# Patient Record
Sex: Male | Born: 1955 | Race: White | Hispanic: No | Marital: Married | Smoking: Never smoker
Health system: Southern US, Community
[De-identification: ages and names within clinical notes are randomized; demographics above are authoritative.]

## PROBLEM LIST (undated history)

## (undated) DIAGNOSIS — G4733 Obstructive sleep apnea (adult) (pediatric): Secondary | ICD-10-CM

## (undated) DIAGNOSIS — H269 Unspecified cataract: Secondary | ICD-10-CM

## (undated) DIAGNOSIS — D126 Benign neoplasm of colon, unspecified: Secondary | ICD-10-CM

## (undated) DIAGNOSIS — I10 Essential (primary) hypertension: Secondary | ICD-10-CM

## (undated) DIAGNOSIS — Q231 Congenital insufficiency of aortic valve: Secondary | ICD-10-CM

## (undated) DIAGNOSIS — I712 Thoracic aortic aneurysm, without rupture: Secondary | ICD-10-CM

## (undated) DIAGNOSIS — Z9989 Dependence on other enabling machines and devices: Secondary | ICD-10-CM

## (undated) DIAGNOSIS — E785 Hyperlipidemia, unspecified: Secondary | ICD-10-CM

## (undated) DIAGNOSIS — I639 Cerebral infarction, unspecified: Secondary | ICD-10-CM

## (undated) DIAGNOSIS — E119 Type 2 diabetes mellitus without complications: Principal | ICD-10-CM

## (undated) DIAGNOSIS — Q2381 Bicuspid aortic valve: Secondary | ICD-10-CM

## (undated) DIAGNOSIS — Z5189 Encounter for other specified aftercare: Secondary | ICD-10-CM

## (undated) DIAGNOSIS — I7121 Aneurysm of the ascending aorta, without rupture: Secondary | ICD-10-CM

## (undated) HISTORY — DX: Obstructive sleep apnea (adult) (pediatric): G47.33

## (undated) HISTORY — PX: CATARACT EXTRACTION: SUR2

## (undated) HISTORY — DX: Encounter for other specified aftercare: Z51.89

## (undated) HISTORY — DX: Thoracic aortic aneurysm, without rupture: I71.2

## (undated) HISTORY — DX: Hyperlipidemia, unspecified: E78.5

## (undated) HISTORY — DX: Unspecified cataract: H26.9

## (undated) HISTORY — DX: Cerebral infarction, unspecified: I63.9

## (undated) HISTORY — DX: Congenital insufficiency of aortic valve: Q23.1

## (undated) HISTORY — PX: EYE SURGERY: SHX253

## (undated) HISTORY — DX: Benign neoplasm of colon, unspecified: D12.6

## (undated) HISTORY — DX: Bicuspid aortic valve: Q23.81

## (undated) HISTORY — DX: Type 2 diabetes mellitus without complications: E11.9

## (undated) HISTORY — DX: Obstructive sleep apnea (adult) (pediatric): Z99.89

## (undated) HISTORY — DX: Essential (primary) hypertension: I10

## (undated) HISTORY — PX: OTHER SURGICAL HISTORY: SHX169

## (undated) HISTORY — DX: Aneurysm of the ascending aorta, without rupture: I71.21

---

## 2003-03-08 ENCOUNTER — Encounter: Payer: Self-pay | Admitting: Internal Medicine

## 2007-08-09 ENCOUNTER — Ambulatory Visit: Payer: Self-pay | Admitting: Internal Medicine

## 2007-08-09 DIAGNOSIS — I10 Essential (primary) hypertension: Secondary | ICD-10-CM | POA: Insufficient documentation

## 2007-08-09 DIAGNOSIS — E1169 Type 2 diabetes mellitus with other specified complication: Secondary | ICD-10-CM | POA: Insufficient documentation

## 2007-08-09 DIAGNOSIS — E785 Hyperlipidemia, unspecified: Secondary | ICD-10-CM | POA: Insufficient documentation

## 2007-08-11 LAB — CONVERTED CEMR LAB
ALT: 22 U/L
AST: 23 U/L
Albumin: 3.5 g/dL
Alkaline Phosphatase: 73 U/L
BUN: 9 mg/dL
Basophils Absolute: 0 10*3/uL
Basophils Relative: 0.3 %
Bilirubin, Direct: 0.1 mg/dL
CO2: 29 meq/L
Calcium: 9.2 mg/dL
Chloride: 105 meq/L
Cholesterol: 223 mg/dL
Creatinine, Ser: 1.3 mg/dL
Direct LDL: 159 mg/dL
Eosinophils Absolute: 0.1 10*3/uL
Eosinophils Relative: 1 %
GFR calc Af Amer: 75 mL/min
GFR calc non Af Amer: 62 mL/min
Glucose, Bld: 100 mg/dL — ABNORMAL HIGH
HCT: 46 %
HDL: 30.7 mg/dL — ABNORMAL LOW
Hemoglobin: 15.6 g/dL
Lymphocytes Relative: 43.9 %
MCHC: 34 g/dL
MCV: 92.4 fL
Monocytes Absolute: 0.6 10*3/uL
Monocytes Relative: 6.5 %
Neutro Abs: 4.3 10*3/uL
Neutrophils Relative %: 48.3 %
PSA: 1.24 ng/mL
Phosphorus: 3.6 mg/dL
Platelets: 263 10*3/uL
Potassium: 4.8 meq/L
RBC: 4.98 M/uL
RDW: 13 %
Sodium: 140 meq/L
TSH: 0.99 u[IU]/mL
Total Bilirubin: 0.8 mg/dL
Total CHOL/HDL Ratio: 7.3
Total Protein: 7.1 g/dL
Triglycerides: 156 mg/dL — ABNORMAL HIGH
VLDL: 31 mg/dL
WBC: 9 10*3/uL

## 2007-09-15 ENCOUNTER — Ambulatory Visit: Payer: Self-pay | Admitting: Gastroenterology

## 2007-09-29 ENCOUNTER — Encounter: Payer: Self-pay | Admitting: Gastroenterology

## 2007-09-29 ENCOUNTER — Encounter: Payer: Self-pay | Admitting: Internal Medicine

## 2007-09-29 ENCOUNTER — Ambulatory Visit: Payer: Self-pay | Admitting: Gastroenterology

## 2007-09-29 DIAGNOSIS — D126 Benign neoplasm of colon, unspecified: Secondary | ICD-10-CM

## 2007-09-29 HISTORY — DX: Benign neoplasm of colon, unspecified: D12.6

## 2007-09-29 HISTORY — PX: COLONOSCOPY W/ BIOPSIES AND POLYPECTOMY: SHX1376

## 2007-09-29 LAB — HM COLONOSCOPY

## 2007-09-30 ENCOUNTER — Observation Stay (HOSPITAL_COMMUNITY): Admission: EM | Admit: 2007-09-30 | Discharge: 2007-10-01 | Payer: Self-pay | Admitting: Emergency Medicine

## 2007-10-03 ENCOUNTER — Ambulatory Visit: Payer: Self-pay | Admitting: Internal Medicine

## 2007-11-01 ENCOUNTER — Ambulatory Visit: Payer: Self-pay | Admitting: Gastroenterology

## 2008-12-17 ENCOUNTER — Ambulatory Visit: Payer: Self-pay | Admitting: Internal Medicine

## 2008-12-17 DIAGNOSIS — R002 Palpitations: Secondary | ICD-10-CM | POA: Insufficient documentation

## 2008-12-19 LAB — CONVERTED CEMR LAB
AST: 26 units/L (ref 0–37)
Albumin: 3.6 g/dL (ref 3.5–5.2)
BUN: 11 mg/dL (ref 6–23)
Basophils Absolute: 0 10*3/uL (ref 0.0–0.1)
Calcium: 8.9 mg/dL (ref 8.4–10.5)
Eosinophils Absolute: 0.1 10*3/uL (ref 0.0–0.7)
Glucose, Bld: 95 mg/dL (ref 70–99)
HCT: 45 % (ref 39.0–52.0)
Lymphocytes Relative: 34.5 % (ref 12.0–46.0)
Lymphs Abs: 2.7 10*3/uL (ref 0.7–4.0)
MCHC: 34 g/dL (ref 30.0–36.0)
Monocytes Relative: 6.6 % (ref 3.0–12.0)
Phosphorus: 3.2 mg/dL (ref 2.3–4.6)
Platelets: 213 10*3/uL (ref 150.0–400.0)
RDW: 12.6 % (ref 11.5–14.6)
TSH: 0.7 microintl units/mL (ref 0.35–5.50)
Total Bilirubin: 1 mg/dL (ref 0.3–1.2)

## 2008-12-30 ENCOUNTER — Ambulatory Visit: Payer: Self-pay

## 2008-12-30 ENCOUNTER — Encounter: Payer: Self-pay | Admitting: Internal Medicine

## 2009-01-20 ENCOUNTER — Ambulatory Visit: Payer: Self-pay | Admitting: Internal Medicine

## 2009-01-20 DIAGNOSIS — I712 Thoracic aortic aneurysm, without rupture, unspecified: Secondary | ICD-10-CM | POA: Insufficient documentation

## 2009-01-20 DIAGNOSIS — R9431 Abnormal electrocardiogram [ECG] [EKG]: Secondary | ICD-10-CM | POA: Insufficient documentation

## 2009-01-20 DIAGNOSIS — G4730 Sleep apnea, unspecified: Secondary | ICD-10-CM | POA: Insufficient documentation

## 2009-01-21 ENCOUNTER — Encounter (INDEPENDENT_AMBULATORY_CARE_PROVIDER_SITE_OTHER): Payer: Self-pay | Admitting: *Deleted

## 2009-01-28 ENCOUNTER — Ambulatory Visit (HOSPITAL_COMMUNITY): Admission: RE | Admit: 2009-01-28 | Discharge: 2009-01-28 | Payer: Self-pay | Admitting: Internal Medicine

## 2009-01-28 ENCOUNTER — Ambulatory Visit: Payer: Self-pay | Admitting: Cardiovascular Disease

## 2009-02-04 ENCOUNTER — Telehealth: Payer: Self-pay | Admitting: Internal Medicine

## 2009-02-05 ENCOUNTER — Telehealth: Payer: Self-pay | Admitting: Internal Medicine

## 2009-02-13 ENCOUNTER — Ambulatory Visit: Payer: Self-pay | Admitting: Internal Medicine

## 2009-02-23 ENCOUNTER — Encounter: Payer: Self-pay | Admitting: Internal Medicine

## 2009-02-24 ENCOUNTER — Ambulatory Visit: Payer: Self-pay | Admitting: Cardiology

## 2009-02-25 ENCOUNTER — Telehealth: Payer: Self-pay | Admitting: Internal Medicine

## 2009-02-25 LAB — CONVERTED CEMR LAB
CO2: 26 meq/L (ref 19–32)
Calcium: 9.2 mg/dL (ref 8.4–10.5)
Chloride: 108 meq/L (ref 96–112)
Glucose, Bld: 110 mg/dL — ABNORMAL HIGH (ref 70–99)
Sodium: 142 meq/L (ref 135–145)

## 2009-02-27 ENCOUNTER — Ambulatory Visit: Payer: Self-pay

## 2009-03-07 ENCOUNTER — Telehealth: Payer: Self-pay | Admitting: Internal Medicine

## 2009-03-13 ENCOUNTER — Telehealth: Payer: Self-pay | Admitting: Cardiology

## 2009-04-03 ENCOUNTER — Ambulatory Visit: Payer: Self-pay | Admitting: Cardiology

## 2009-04-04 LAB — CONVERTED CEMR LAB
BUN: 13 mg/dL (ref 6–23)
CO2: 27 meq/L (ref 19–32)
Chloride: 106 meq/L (ref 96–112)
Creatinine, Ser: 1.3 mg/dL (ref 0.4–1.5)
Glucose, Bld: 100 mg/dL — ABNORMAL HIGH (ref 70–99)
Potassium: 4.6 meq/L (ref 3.5–5.1)

## 2009-05-14 ENCOUNTER — Ambulatory Visit: Payer: Self-pay | Admitting: Cardiology

## 2009-05-15 ENCOUNTER — Ambulatory Visit (HOSPITAL_COMMUNITY): Admission: RE | Admit: 2009-05-15 | Discharge: 2009-05-15 | Payer: Self-pay | Admitting: Cardiology

## 2009-05-18 ENCOUNTER — Encounter: Payer: Self-pay | Admitting: Cardiology

## 2009-05-19 LAB — CONVERTED CEMR LAB
BUN: 12 mg/dL (ref 6–23)
CO2: 27 meq/L (ref 19–32)
Calcium: 8.9 mg/dL (ref 8.4–10.5)
Creatinine, Ser: 1.2 mg/dL (ref 0.4–1.5)
GFR calc non Af Amer: 67.23 mL/min (ref >60)
Glucose, Bld: 116 mg/dL — ABNORMAL HIGH (ref 70–99)
Sodium: 140 meq/L (ref 135–145)

## 2009-05-20 ENCOUNTER — Ambulatory Visit: Payer: Self-pay | Admitting: Cardiology

## 2009-05-20 DIAGNOSIS — I359 Nonrheumatic aortic valve disorder, unspecified: Secondary | ICD-10-CM | POA: Insufficient documentation

## 2009-05-23 ENCOUNTER — Ambulatory Visit: Payer: Self-pay | Admitting: Cardiology

## 2009-05-26 LAB — CONVERTED CEMR LAB
HDL: 23.8 mg/dL — ABNORMAL LOW (ref >39.00)
Total CHOL/HDL Ratio: 7

## 2009-06-03 ENCOUNTER — Telehealth: Payer: Self-pay | Admitting: Cardiology

## 2009-08-04 ENCOUNTER — Encounter: Payer: Self-pay | Admitting: Cardiology

## 2009-08-05 ENCOUNTER — Encounter: Payer: Self-pay | Admitting: Cardiology

## 2009-08-06 ENCOUNTER — Ambulatory Visit: Payer: Self-pay | Admitting: Cardiology

## 2009-08-12 ENCOUNTER — Telehealth (INDEPENDENT_AMBULATORY_CARE_PROVIDER_SITE_OTHER): Payer: Self-pay | Admitting: *Deleted

## 2009-08-13 ENCOUNTER — Telehealth: Payer: Self-pay | Admitting: Cardiology

## 2009-08-13 ENCOUNTER — Ambulatory Visit: Payer: Self-pay | Admitting: Cardiology

## 2009-08-15 LAB — CONVERTED CEMR LAB
ALT: 27 units/L (ref 0–53)
Albumin: 3.4 g/dL — ABNORMAL LOW (ref 3.5–5.2)
Bilirubin, Direct: 0.1 mg/dL (ref 0.0–0.3)
Cholesterol: 128 mg/dL (ref 0–200)
LDL Cholesterol: 73 mg/dL (ref 0–99)
Total Protein: 7 g/dL (ref 6.0–8.3)
Triglycerides: 118 mg/dL (ref 0.0–149.0)
VLDL: 23.6 mg/dL (ref 0.0–40.0)

## 2009-08-26 ENCOUNTER — Ambulatory Visit: Payer: Self-pay | Admitting: Internal Medicine

## 2009-08-26 DIAGNOSIS — J019 Acute sinusitis, unspecified: Secondary | ICD-10-CM | POA: Insufficient documentation

## 2009-09-08 ENCOUNTER — Telehealth (INDEPENDENT_AMBULATORY_CARE_PROVIDER_SITE_OTHER): Payer: Self-pay | Admitting: *Deleted

## 2009-09-12 ENCOUNTER — Encounter: Payer: Self-pay | Admitting: Cardiology

## 2009-09-12 ENCOUNTER — Ambulatory Visit (HOSPITAL_BASED_OUTPATIENT_CLINIC_OR_DEPARTMENT_OTHER): Admission: RE | Admit: 2009-09-12 | Discharge: 2009-09-12 | Payer: Self-pay | Admitting: Cardiology

## 2009-09-23 ENCOUNTER — Ambulatory Visit: Payer: Self-pay | Admitting: Pulmonary Disease

## 2009-09-24 ENCOUNTER — Telehealth (INDEPENDENT_AMBULATORY_CARE_PROVIDER_SITE_OTHER): Payer: Self-pay | Admitting: *Deleted

## 2009-10-03 ENCOUNTER — Ambulatory Visit: Payer: Self-pay | Admitting: Pulmonary Disease

## 2009-10-03 DIAGNOSIS — G4733 Obstructive sleep apnea (adult) (pediatric): Secondary | ICD-10-CM | POA: Insufficient documentation

## 2009-10-10 ENCOUNTER — Telehealth: Payer: Self-pay | Admitting: Cardiology

## 2009-10-23 ENCOUNTER — Ambulatory Visit: Payer: Self-pay | Admitting: Cardiology

## 2009-10-23 LAB — CONVERTED CEMR LAB
BUN: 15 mg/dL (ref 6–23)
Chloride: 109 meq/L (ref 96–112)
Creatinine, Ser: 1.3 mg/dL (ref 0.4–1.5)
GFR calc non Af Amer: 61.2 mL/min (ref >60)
Potassium: 5.5 meq/L — ABNORMAL HIGH (ref 3.5–5.1)

## 2009-10-30 ENCOUNTER — Ambulatory Visit: Payer: Self-pay | Admitting: Cardiology

## 2009-10-30 DIAGNOSIS — E875 Hyperkalemia: Secondary | ICD-10-CM | POA: Insufficient documentation

## 2009-11-05 ENCOUNTER — Ambulatory Visit (HOSPITAL_COMMUNITY): Admission: RE | Admit: 2009-11-05 | Discharge: 2009-11-05 | Payer: Self-pay | Admitting: Cardiology

## 2009-11-05 ENCOUNTER — Ambulatory Visit: Payer: Self-pay | Admitting: Cardiology

## 2009-11-05 LAB — CONVERTED CEMR LAB
CO2: 27 meq/L (ref 19–32)
Calcium: 9 mg/dL (ref 8.4–10.5)
GFR calc non Af Amer: 61.19 mL/min (ref >60)
Potassium: 4.3 meq/L (ref 3.5–5.1)
Sodium: 139 meq/L (ref 135–145)

## 2009-11-07 ENCOUNTER — Ambulatory Visit: Payer: Self-pay | Admitting: Pulmonary Disease

## 2009-12-08 ENCOUNTER — Encounter (INDEPENDENT_AMBULATORY_CARE_PROVIDER_SITE_OTHER): Payer: Self-pay | Admitting: *Deleted

## 2009-12-11 ENCOUNTER — Encounter (INDEPENDENT_AMBULATORY_CARE_PROVIDER_SITE_OTHER): Payer: Self-pay | Admitting: *Deleted

## 2010-01-07 ENCOUNTER — Encounter: Payer: Self-pay | Admitting: Cardiology

## 2010-01-07 ENCOUNTER — Ambulatory Visit (HOSPITAL_COMMUNITY): Admission: RE | Admit: 2010-01-07 | Discharge: 2010-01-07 | Payer: Self-pay | Admitting: Cardiology

## 2010-01-07 ENCOUNTER — Ambulatory Visit: Payer: Self-pay

## 2010-01-07 ENCOUNTER — Ambulatory Visit: Payer: Self-pay | Admitting: Cardiology

## 2010-01-21 ENCOUNTER — Encounter: Payer: Self-pay | Admitting: Cardiology

## 2010-01-22 ENCOUNTER — Ambulatory Visit: Payer: Self-pay | Admitting: Cardiology

## 2010-02-11 ENCOUNTER — Encounter: Payer: Self-pay | Admitting: Pulmonary Disease

## 2010-05-08 ENCOUNTER — Ambulatory Visit: Payer: Self-pay | Admitting: Pulmonary Disease

## 2010-05-08 ENCOUNTER — Ambulatory Visit: Payer: Self-pay | Admitting: Internal Medicine

## 2010-05-11 LAB — CONVERTED CEMR LAB
Alkaline Phosphatase: 59 units/L (ref 39–117)
Basophils Relative: 0.6 % (ref 0.0–3.0)
Chloride: 109 meq/L (ref 96–112)
Cholesterol: 129 mg/dL (ref 0–200)
Eosinophils Relative: 1.7 % (ref 0.0–5.0)
Glucose, Bld: 106 mg/dL — ABNORMAL HIGH (ref 70–99)
HDL: 27.5 mg/dL — ABNORMAL LOW (ref >39.00)
Hemoglobin: 14.4 g/dL (ref 13.0–17.0)
LDL Cholesterol: 82 mg/dL (ref 0–99)
Lymphocytes Relative: 44.3 % (ref 12.0–46.0)
Monocytes Relative: 7.6 % (ref 3.0–12.0)
Neutrophils Relative %: 45.8 % (ref 43.0–77.0)
Phosphorus: 2.9 mg/dL (ref 2.3–4.6)
Potassium: 4.6 meq/L (ref 3.5–5.1)
RBC: 4.68 M/uL (ref 4.22–5.81)
Sodium: 142 meq/L (ref 135–145)
Total Protein: 6.7 g/dL (ref 6.0–8.3)
Triglycerides: 98 mg/dL (ref 0.0–149.0)
VLDL: 19.6 mg/dL (ref 0.0–40.0)
WBC: 7.2 10*3/uL (ref 4.5–10.5)

## 2010-06-28 ENCOUNTER — Encounter: Payer: Self-pay | Admitting: Pulmonary Disease

## 2010-07-27 ENCOUNTER — Encounter: Payer: Self-pay | Admitting: Cardiology

## 2010-07-29 ENCOUNTER — Ambulatory Visit: Payer: Self-pay | Admitting: Cardiology

## 2010-09-18 ENCOUNTER — Other Ambulatory Visit: Payer: Self-pay | Admitting: Cardiology

## 2010-09-18 DIAGNOSIS — I729 Aneurysm of unspecified site: Secondary | ICD-10-CM

## 2010-09-20 ENCOUNTER — Encounter: Payer: Self-pay | Admitting: Cardiology

## 2010-09-29 NOTE — Cardiovascular Report (Signed)
Summary: Outpatient Coinsurance Notice   Outpatient Coinsurance Notice   Imported By: Roderic Ovens 01/12/2010 12:42:26  _____________________________________________________________________  External Attachment:    Type:   Image     Comment:   External Document

## 2010-09-29 NOTE — Miscellaneous (Signed)
Summary: optimal pressure 13cm  Clinical Lists Changes  Orders: Added new Referral order of DME Referral (DME) - Signed auto shows good compliance, optimal pressure 13cm

## 2010-09-29 NOTE — Letter (Signed)
Summary: Appointment - Reschedule  [facility], Main Office  [address]   [place], [place] [address]   Phone: [phone]  Fax: [phone]     December 08, 2009 MRN: [id number]   [facility] [address] [place], [place]  [address]   Dear Mr. Ivan,   Due to a change in our office schedule, your appointment on 01/20/2010 at  4:00 must be changed.  It is very important that we reach you to reschedule this appointment. We look forward to participating in your health care needs. Please contact us at the number listed above at your earliest convenience to reschedule this appointment.     Sincerely,  [facility] Scheduling Team

## 2010-09-29 NOTE — Assessment & Plan Note (Signed)
Summary: rov f/u few days after echo 01-07-10 @ 8:30   Referring Provider:  Shirlee Latch Primary Provider:  Cindee Salt MD  CC:  follow up to echo. Pt reports no cardiac concerns at this time.  Pt states very tired. Pt then states that when BP gets low his heart feels like it is beating fast..  History of Present Illness: 55 yo with bicuspid aortic valve disorder and dilated ascending aorta returns for evaluation. Aortic valve and aortic aneurysm were stable on last echo and MRA.  BP is under good control (109/67 today).  Patient has lost 17 lbs since last appointment with diet and exercise.  He has started CPAP for OSA.  Initially, he felt a lot better but has now gone back to significant fatigue during the day.  He thinks it may be due to his work schedule which has been very stressful with long hours (several projects).  No chest pain.  No exertional dyspnea.  He has had episodes where he feels his heart racing.  This usually occurs at rest.  He takes his heart rate and BP when this happens, and with these episodes, SBP is in the 90s and HR ranges from the 130s-150s.  Episodes are rare and tend to last 15-30 minutes.    Labs (9/10): LDL 134, HDL 24 Labs (12/10): LDL 73, HDL 32 Labs (3/11): K 4.3, creatinine 1.3  Current Medications (verified): 1)  Adult Aspirin Low Strength 81 Mg  Tbdp (Aspirin) .... Take 1 Tablet By Mouth Once A Day 2)  Fish Oil Concentrate 1000 Mg Caps (Omega-3 Fatty Acids) .... Take 2 By Mouth Once Daily 3)  Lisinopril 20 Mg Tabs (Lisinopril) .[name].. 1 Daily 4)  Toprol Xl 50 Mg Xr24h-Tab (Metoprolol Succinate) .[name].. 1 Daily 5)  Crestor 5 Mg Tabs (Rosuvastatin Calcium) .[name].. 1 Daily 6)  Lisinopril 10 Mg Tabs (Lisinopril) .... One Tablet Daily At Bedtime 7)  Viagra 50 Mg Tabs (Sildenafil Citrate) .... One Dose As Needed 1 Hour Prior To Sexual Activity 8)  Cpap  Allergies (verified): No Known Drug Allergies  Past History:  Past Medical History: 1. Hyperlipidemia 2.  Hypertension 3. Colon Polyps--adenomatous  4. Post Polypectomy Bleed 5. Bicuspid aortic valve: Echo (5/10) with bicuspid AoV (upon re-review), mild LV dilation, EF 50-55%, no aortic stenosis, mild AI, moderate ascending aortic dilation (4.8 cm).  Bicuspid valve confirmed by MRI.  Echo (3/11) bicuspid aortic valve with no AS but mild AI, mild LVH, EF 60%.  6. Ascending aortic aneurysm: 4.8 cm on echo (5/10), 4.6 x 4.6 cm by MRA 6/10, 4.6 cm aortic root and ascending aorta by MRA 9/10.  MRA chest (3/11) 4.5 cm ascending aorta.  7. OSA on CPAP  Family History: Reviewed history from 08/09/2007 and no changes required. Dad died @52  some type of cancer Mom has HTN,  DM 1 brother 2 sisters CAD in mat GF HTN on Mom's side DM just in Mom No prostate or colon cancer  Social History: Reviewed history from 08/09/2007 and no changes required. 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   Vital Signs:  Patient profile:   55 year old male Height:      72 inches Weight:      249 pounds BMI:     33.89 Pulse rate:   70 / minute Pulse rhythm:   regular BP sitting:  109 / 67  (left arm) Cuff size:   large  Vitals Entered By: Judithe Modest CMA (Jan 22, 2010 4:12 PM)  Physical Exam  General:  Well developed, well nourished, in no acute distress. Neck:  Neck supple, no JVD. No masses, thyromegaly or abnormal cervical nodes. Lungs:  Clear bilaterally to auscultation and percussion. Heart:  Non-displaced PMI, chest non-tender; regular rate and rhythm, S1, S2 without rubs or gallops. 2/6 early systolic crescendo-decrescendo murmur with clear S2.  Carotid upstroke normal, no bruit. Pedals normal pulses. No edema, no varicosities. Abdomen:  Bowel sounds positive; abdomen soft and non-tender without masses, organomegaly, or hernias noted. No  hepatosplenomegaly. Extremities:  No clubbing or cyanosis. Neurologic:  Alert and oriented x 3. Psych:  Normal affect.   Impression & Recommendations:  Problem # 1:  AORTIC VALVE DISORDERS (ICD-424.1) Bicuspid aortic valve with no significant AS and mild AI.  Repeat echo in 1 year after last echo (5/12).    Problem # 2:  ESSENTIAL HYPERTENSION, BENIGN (ICD-401.1) BP is well-controlled, continue current medications.   Problem # 3:  ASCENDING AORTIC ANEURYSM (ICD-441.2) Patient has bicuspid aortic valve disorder with a dilated aortic root and ascending aorta.  The arch is not significantly dilated.  He is no AS and mild AI.  Last MRA showed ascending aorta measuring 4.5 cm (stable).  BP control will be important.  He will followup for MRA thoracic aorta in 3/12.  When it reaches 5 cm in size, he will need repair.  Problem # 4:  PALPITATIONS (ICD-785.1) Patient has documented episodes of tachycardia and relative hypotension on his home BP monitor.  They tend to occur periodically, not associated with exertion.  HRs range 130s-150s.  ? SVT versus atrial fibrillation.  I will have him wear an event monitor for 3 wks to assess for arrhythmias.    Other Orders: MRA (MRA) Echocardiogram (Echo) Event (Event)  Patient Instructions: 1)  Your physician recommends that you schedule a follow-up appointment in: 6 months 2)  Your physician has requested that you have an echocardiogram.  Echocardiography is a painless test that uses sound waves to create images of your heart. It provides your doctor with information about the size and shape of your heart and how well your heart's chambers and valves are working.  This procedure takes approximately one hour. There are no restrictions for this procedure. To be done in May 2012 3)  Your physician has recommended that you wear an event monitor.  Event monitors are medical devices that record the heart's electrical activity. Doctors most often use these  monitors to diagnose arrhythmias. Arrhythmias are problems with the speed or rhythm of the heartbeat. The monitor is a small, portable device. You can wear one while you do your normal daily activities. This is usually used to diagnose what is causing palpitations/syncope (passing out). 4)  Your physician has requested that you have a cardiac MRI.  Cardiac MRI uses a computer to create images of your heart as it's beating, producing both still and moving pictures of your heart and major blood vessels. For further information please visit  [email or web address]  To be done in march 2012 Prescriptions: VIAGRA 50 MG TABS (SILDENAFIL CITRATE) one dose as needed 1 hour prior to sexual activity  #20 x 2   Entered by:   Dossie Arbour, RN, BSN   Authorized by:   Marca Ancona, MD   Signed by:   Dossie Arbour, RN, BSN on 01/22/2010   Method  used:   Electronically to        [facility] (retail)       [address]       [place], [place]  [address]       Ph: [phone]       Fax: [phone]   RxID:   [id number]

## 2010-09-29 NOTE — Assessment & Plan Note (Signed)
Summary: 6 month rov.sl   Referring Chrystine Frogge:  Shirlee Latch Primary Ailine Hefferan:  Cindee Salt MD  CC:  check up.  History of Present Illness: 55 yo with bicuspid aortic valve disorder and dilated ascending aorta returns for evaluation. Aortic valve and aortic aneurysm were stable on last echo and MRA.  BP is under good control (122/74 today).  His home readings have for the most past been less than 130 systolic.  After last appointment, he never did the event monitor and says that the tachypalpitations he was having have completely resolved.  He has not been exercising as much as he had been and his weight is back up 20 lbs since last appointment (had lost 17 lbs before).  No chest pain, no exertional dyspnea.  He is using CPAP.   Labs (9/10): LDL 134, HDL 24 Labs (12/10): LDL 73, HDL 32 Labs (3/11): K 4.3, creatinine 1.3  ECG: NSR, LAFB, Qs V1 and V2  Current Medications (verified): 1)  Lisinopril 10 Mg Tabs (Lisinopril) .... One Tablet Daily At Bedtime 2)  Lisinopril 20 Mg Tabs (Lisinopril) .[name].. 1 Daily 3)  Toprol Xl 50 Mg Xr24h-Tab (Metoprolol Succinate) .[name].. 1 Daily 4)  Crestor 5 Mg Tabs (Rosuvastatin Calcium) .[name].. 1 Daily 5)  Viagra 50 Mg Tabs (Sildenafil Citrate) .... One Dose As Needed 1 Hour Prior To Sexual Activity 6)  Adult Aspirin Low Strength 81 Mg  Tbdp (Aspirin) .... Take 1 Tablet By Mouth Once A Day 7)  Flax Seed Oil 1000 Mg Caps (Flaxseed (Linseed)) .... One Tablet Once Daily 8)  Cpap  Allergies: No Known Drug Allergies  Past History:  Past Medical History: Reviewed history from 01/22/2010 and no changes required. 1. Hyperlipidemia 2. Hypertension 3. Colon Polyps--adenomatous  4. Post Polypectomy Bleed 5. Bicuspid aortic valve: Echo (5/10) with bicuspid AoV (upon re-review), mild LV dilation, EF 50-55%, no aortic stenosis, mild AI, moderate ascending aortic dilation (4.8 cm).  Bicuspid valve confirmed by MRI.  Echo (3/11) bicuspid aortic valve with no AS but mild  AI, mild LVH, EF 60%.  6. Ascending aortic aneurysm: 4.8 cm on echo (5/10), 4.6 x 4.6 cm by MRA 6/10, 4.6 cm aortic root and ascending aorta by MRA 9/10.  MRA chest (3/11) 4.5 cm ascending aorta.  7. OSA on CPAP  Family History: Reviewed history from 08/09/2007 and no changes required. Dad died @52  some type of cancer Mom has HTN,  DM 1 brother 2 sisters CAD in mat GF HTN on Mom's side DM just in Mom No prostate or colon cancer  Social History: Reviewed history from 08/09/2007 and no changes required. 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  Obese.  Neck:  Neck supple, no JVD. No masses, thyromegaly or abnormal cervical nodes. Lungs:  Clear bilaterally to auscultation and percussion. Heart:  Non-displaced PMI, chest non-tender; regular rate and rhythm, S1, S2 without rubs or gallops. 1/6 early systolic ejection murmur RUSB.  Carotid upstroke normal, no bruit. Pedals normal pulses. No edema, no varicosities. Abdomen:  Bowel sounds positive; abdomen soft and non-tender without masses, organomegaly, or hernias noted. No hepatosplenomegaly. Extremities:  No clubbing or cyanosis. Neurologic:  Alert and oriented x 3. Psych:  Normal affect.   Impression & Recommendations:  Problem # 1:  AORTIC VALVE DISORDERS (ICD-424.1) Bicuspid aortic valve with no significant AS and mild AI.  Can repeat echo 2 years after last echo if remains stable (5/13).   Problem # 2:  ESSENTIAL HYPERTENSION, BENIGN (ICD-401.1) BP is well-controlled, continue current medications.  Need good BP control for  management of thoracic aortic aneurysm.   Problem # 3:  ASCENDING AORTIC ANEURYSM (ICD-441.2) Patient has bicuspid aortic valve disorder with a dilated aortic root and ascending aorta.  The arch is not significantly dilated.   Last MRA showed ascending aorta measuring 4.5 cm (stable).  BP control will be important.  He will followup for MRA thoracic aorta in 3/12.  When it reaches 5 cm in size, he will need repair.  Problem # 4:  OBESITY Patient has gained 20 lbs since last appointment. We talked about diet and exercise.   Patient Instructions: 1)  MRA of chest to check your  aorta in March 2012. 2)  Echo in May 2012. 3)  Your physician wants you to follow-up in: 6 months with Dr Shirlee Latch..  You will receive a reminder letter in the mail two months in advance. If you don't receive a letter, please call our office to schedule the follow-up appointment.

## 2010-09-29 NOTE — Assessment & Plan Note (Signed)
Summary: rov for osa   Visit Type:  Follow-up Copy to:  Shirlee Latch Primary Provider/Referring Provider:  Cindee Salt MD  CC:  6 month follow up. pt states uses cpapc 7/7 nights and x6-7 hrs a night. Pt states he has no compaints today.  History of Present Illness: the pt comes in today for f/u of his known osa.  He has been wearing cpap compliantly, and reports no issues with his mask fit or pressure.  He is sleeping better at night, and thinks his alertness is improved as well.  At the last visit, we were trying to optimize his pressure with an auto setting at home.  He reports that he had issues with the ramping of the pressure, and the dme never notified me nor had his presssure optimized.  Preventive Screening-Counseling & Management  Alcohol-Tobacco     Smoking Status: never  Allergies: No Known Drug Allergies  Review of Systems  The patient denies shortness of breath with activity, shortness of breath at rest, productive cough, non-productive cough, coughing up blood, chest pain, irregular heartbeats, acid heartburn, indigestion, loss of appetite, weight change, abdominal pain, difficulty swallowing, sore throat, tooth/dental problems, headaches, nasal congestion/difficulty breathing through nose, sneezing, itching, ear ache, anxiety, depression, hand/feet swelling, joint stiffness or pain, rash, change in color of mucus, and fever.    Vital Signs:  Patient profile:   55 year old male Height:      70.5 inches Weight:      255.50 pounds O2 Sat:      96 % on Room air Temp:     98.5 degrees F oral Pulse rate:   62 / minute BP sitting:   118 / 78  (right arm) Cuff size:   regular  Vitals Entered By: Carver Fila (May 08, 2010 3:39 PM)  O2 Flow:  Room air CC: 6 month follow up. pt states uses cpapc 7/7 nights, x6-7 hrs a night. Pt states he has no compaints today Is Patient Diabetic? No Comments meds and allergies updated Phone number updated Carver Fila  May 08, 2010 3:42 PM     Physical Exam  General:  ow male in nad Nose:  no skin breakdown or pressure necrosis from cpap mask Extremities:  no edema or cyanosis  Neurologic:  alert and oriented, does not appear sleepy, moves all 4   Impression & Recommendations:  Problem # 1:  OBSTRUCTIVE SLEEP APNEA (ICD-327.23) the pt is doing well with cpap, and feels that it is helping him wrt sleep and daytime alertness.  I would still like to optimize his pressure, and will try on auto with lower pressure range to see if we can get a good 2 weeks of data before switching over to a set pressure.  The pt is agreeable.  I have also encouraged him to work aggressively on weight loss.  Medications Added to Medication List This Visit: 1)  Flax Seed Oil 1000 Mg Caps (Flaxseed (linseed)) .... One tablet once daily  Other Orders: Est. Patient Level III (43329) DME Referral (DME)  Patient Instructions: 1)  will check your pressure with auto again but limited pressure ranges 2)  continue to work on weight loss 3)  followup with me in one year.   Immunization History:  Influenza Immunization History:    Influenza:  historical (05/30/2009)

## 2010-09-29 NOTE — Letter (Signed)
Summary: Appointment - Reschedule  [facility], Main Office  [address]   [place], [place] [address]   Phone: [phone]  Fax: [phone]     December 11, 2009 MRN: [id number]   [facility] [address] [place], [place]  [address]   Dear Timothy Bernard,   Due to a change in our office schedule, your appointment on 01/20/2010 at 4:00pm must be changed.  It is very important that we reach you to reschedule this appointment. We look forward to participating in your health care needs. Please contact us at the number listed above at your earliest convenience to reschedule this appointment.     Sincerely,  Glass blower/designer

## 2010-09-29 NOTE — Assessment & Plan Note (Signed)
Summary: consult for osa   Copy to:  Timothy Bernard Primary Provider/Referring Provider:  Cindee Salt MD  CC:  Sleep consult. Epworth score is 13.[name]  History of Present Illness: The pt is a 55y/o male who comes in today for managment of osa.  He has had a recent sleep study, with an AHI of 26/hr during a split night study, and desat to 90%.  He was placed on cpap with a medium quattro FFM, and titrated to 12cm of pressure.  He has very little time for optimal titration, therefore it is unclear if this is optimal.  The pt has an aortic aneurysm, and has been having issues with adequate control.  His wife has noted snoring, as well as an abnormal breathing pattern with pauses during sleep.  He typically goes to bed btw 10-11pm, and arises at 5:30am to start his day.  He notes frequent awakenings during the night, and has nonrestorative sleep.  He works as an Art gallery manager, and does note some sleep pressure with periods of inactivity while at work.  This is especially an issue after lunch.  He admits that his alertness and concentration are not at an acceptable level on a frequent basis.  He will doze with tv or movies in the evening, and also notes mild sleep pressure while driving longer distances.  His weight is neutral over the past 2 years, and his epworth score today is abnormal at 13.  Preventive Screening-Counseling & Management  Alcohol-Tobacco     Smoking Status: never  Current Medications (verified): 1)  Adult Aspirin Low Strength 81 Mg  Tbdp (Aspirin) .... Take 1 Tablet By Mouth Once A Day 2)  Fish Oil Concentrate 1000 Mg Caps (Omega-3 Fatty Acids) .... Take 2 By Mouth Once Daily 3)  Lisinopril 20 Mg Tabs (Lisinopril) .[name].. 1 Daily 4)  Toprol Xl 50 Mg Xr24h-Tab (Metoprolol Succinate) .[name].. 1 Daily 5)  Crestor 5 Mg Tabs (Rosuvastatin Calcium) .[name].. 1 Daily 6)  Lisinopril 10 Mg Tabs (Lisinopril) .... One Tablet Daily At Bedtime 7)  Viagra 50 Mg Tabs (Sildenafil Citrate) .... One Dose As Needed 1  Hour Prior To Sexual Activity  Allergies (verified): No Known Drug Allergies  Past History:  Past Medical History: Reviewed history from 08/06/2009 and no changes required. 1. Hyperlipidemia 2. Hypertension 3. Colon Polyps--adenomatous  4. Post Polypectomy Bleed 5. Bicuspid aortic valve: Echo (5/10) with bicuspid AoV (upon re-review), mild LV dilation, EF 50-55%, no aortic stenosis, mild AI, moderate ascending aortic dilation (4.8 cm).  Bicuspid valve confirmed by MRI.  6. Ascending aortic aneurysm: 4.8 cm on echo (5/10), 4.6 x 4.6 cm by MRA 6/10, 4.6 cm aortic root and ascending aorta by MRA 9/10.    Past Surgical History: Reviewed history from 08/09/2007 and no changes required. Denies surgical history  Family History: Reviewed history from 08/09/2007 and no changes required. Dad died @52  some type of cancer Mom has HTN,  DM 1 brother 2 sisters CAD in mat GF HTN on Mom's side DM just in Mom No prostate or colon cancer  Social History: Reviewed history from 08/09/2007 and no changes required. 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    Vital Signs:  Patient profile:   55 year old male Height:      72 inches (182.88 cm) Weight:      268 pounds (121.82 kg) BMI:     36.48 O2 Sat:      97 % on Room air Temp:     98.2 degrees F (36.78 degrees C) oral Pulse rate:   61 / minute BP  sitting:   130 / 78  (left arm) Cuff size:   large  Vitals Entered By: Michel Bickers CMA (October 03, 2009 3:02 PM)  O2 Sat at Rest %:  97 O2 Flow:  Room air CC: Sleep consult. Epworth score is 13.   Physical Exam  General:  ow male in nad Eyes:  PERRLA and EOMI.   Nose:  narrowed bilat, with turbinate hypertrophy Mouth:  moderate elongation of soft palate and uvula Neck:  no jvd, tmg, LN Lungs:   totally clear to auscultation Heart:  rrr, no mrg Abdomen:  soft and nontender, bs+ Extremities:  no edema, pulses intact distally Neurologic:  alert and oriented,moves all 4.   Impression & Recommendations:  Problem # 1:  OBSTRUCTIVE SLEEP APNEA (ICD-327.23) the pt has moderate obstructive sleep apnea by his recent sleep study, and describes sleep disruption and daytime symptoms.  He also has underlying CV issues that would benefit from aggressive treatment of his sleep disordered breathing.  I have outlined various treatment options including cpap, dental appliance, and weight loss.  I do not think his upper airway anatomy is abnormal enough to justify surgery.  After a long discussion, both the pt and I agree that cpap coupled with weight loss would be the best initial treatment.  Will start him on a moderate pressure level for desensitization, and work on pressure optimization at home at a later date.  Other Orders: Consultation Level IV (60454) DME Referral (DME)  Patient Instructions: 1)  will setup on cpap.  Please call if tolerance issues 2)  work on weight loss 3)  followup with me in 4 weeks.

## 2010-09-29 NOTE — Assessment & Plan Note (Signed)
Summary: CPX  CYD   Vital Signs:  Patient profile:   55 year old male Height:      70.5 inches Weight:      253 pounds Temp:     98.8 degrees F oral Pulse rate:   68 / minute Pulse rhythm:   regular BP sitting:   128 / 68  (left arm) Cuff size:   large  Vitals Entered By: Mervin Hack CMA Duncan Dull) (May 08, 2010 9:22 AM) CC: adult physical   History of Present Illness: Doing well No new concerns has kept up with cardiology follow up---- things are stable No surgery needed as yet  Using CPAP--seems to be helping  Still sporadic with exercise  Allergies: No Known Drug Allergies  Past History:  Past medical, surgical, family and social histories (including risk factors) reviewed for relevance to current acute and chronic problems.  Past Medical History: Reviewed history from 01/22/2010 and no changes required. 1. Hyperlipidemia 2. Hypertension 3. Colon Polyps--adenomatous  4. Post Polypectomy Bleed 5. Bicuspid aortic valve: Echo (5/10) with bicuspid AoV (upon re-review), mild LV dilation, EF 50-55%, no aortic stenosis, mild AI, moderate ascending aortic dilation (4.8 cm).  Bicuspid valve confirmed by MRI.  Echo (3/11) bicuspid aortic valve with no AS but mild AI, mild LVH, EF 60%.  6. Ascending aortic aneurysm: 4.8 cm on echo (5/10), 4.6 x 4.6 cm by MRA 6/10, 4.6 cm aortic root and ascending aorta by MRA 9/10.  MRA chest (3/11) 4.5 cm ascending aorta.  7. OSA on CPAP  Past Surgical History: Reviewed history from 08/09/2007 and no changes required. Denies surgical history  Family History: Reviewed history from 08/09/2007 and no changes required. Dad died @52  some type of cancer Mom has HTN,  DM 1 brother 2 sisters CAD in mat GF HTN on Mom's side DM just in Mom No prostate or colon cancer  Social History: Reviewed history from 08/09/2007 and no changes required. 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 Eyes:  Denies double vision and vision loss-1 eye. ENT:  Denies decreased hearing and ringing in ears; teeth okay--regular with dentist . CV:  Denies chest pain or discomfort, difficulty breathing at night, difficulty breathing while lying down, fainting, lightheadness, palpitations, and shortness of breath with exertion; did have one episode of palpitations during MRA. Resp:  Denies cough and shortness of breath. GI:  Denies abdominal pain, bloody stools, change in bowel habits, dark tarry stools, indigestion, nausea, and vomiting. GU:  Denies erectile dysfunction, urinary frequency, and urinary hesitancy. MS:  Denies joint pain and joint swelling. Derm:  Denies lesion(s) and rash. Neuro:  Denies headaches, numbness, tingling, and weakness. Psych:  Denies anxiety and depression. Heme:  Denies abnormal bruising and enlarge lymph nodes. Allergy:  Complains of seasonal allergies and sneezing; mild symptoms--no meds in general.  Physical Exam  General:  alert and normal appearance.   Eyes:  pupils equal, pupils round, pupils reactive to light, and no optic disk abnormalities.   Ears:  R ear normal and L ear normal.   Mouth:  no erythema, no exudates, and no lesions.   Neck:  supple, no masses, no thyromegaly, no carotid bruits, and no cervical lymphadenopathy.   Lungs:  normal respiratory effort, no intercostal retractions, no accessory muscle use, and normal breath sounds.   Heart:  normal rate, regular rhythm, no  murmur, and no gallop.   Abdomen:  soft, non-tender, no masses, no hepatomegaly, and no splenomegaly.   Rectal:  no hemorrhoids and no masses.   Prostate:  no gland enlargement and no nodules.   Msk:  no joint tenderness and no joint swelling.   Pulses:  1+ in feet Extremities:  no edema Neurologic:  alert & oriented X3, strength normal in all extremities, and  gait normal.   Skin:  no rashes and no suspicious lesions.   Axillary Nodes:  No palpable lymphadenopathy Psych:  normally interactive, good eye contact, not anxious appearing, and not depressed appearing.     Impression & Recommendations:  Problem # 1:  PREVENTIVE HEALTH CARE (ICD-V70.0) Assessment Comment Only discussed fitness--he needs to work on this will check PSA colon due next year  Problem # 2:  HYPERTENSION (ICD-401.9) Assessment: Unchanged  good control lower goal due to aneurysm  His updated medication list for this problem includes:    Lisinopril 10 Mg Tabs (Lisinopril) ..... One tablet daily at bedtime    Lisinopril 20 Mg Tabs (Lisinopril) .[name]... 1 daily    Toprol Xl 50 Mg Xr24h-tab (Metoprolol succinate) .[name]... 1 daily  BP today: 128/68 Prior BP: 109/67 (01/22/2010)  Labs Reviewed: K+: 4.3 (10/30/2009) Creat: : 1.3 (10/30/2009)   Chol: 128 (08/13/2009)   HDL: 31.90 (08/13/2009)   LDL: 73 (08/13/2009)   TG: 118.0 (08/13/2009)  Orders: TLB-Renal Function Panel (80069-RENAL) TLB-CBC Platelet - w/Differential (85025-CBCD) TLB-TSH (Thyroid Stimulating Hormone) (84443-TSH) Venipuncture (09811)  Problem # 3:  HYPERLIPIDEMIA (ICD-272.4) Assessment: Unchanged  due for labs  His updated medication list for this problem includes:    Crestor 5 Mg Tabs (Rosuvastatin calcium) .[name]... 1 daily  Labs Reviewed: SGOT: 24 (08/13/2009)   SGPT: 27 (08/13/2009)   HDL:31.90 (08/13/2009), 23.80 (05/23/2009)  LDL:73 (08/13/2009), 134 (91/47/8295)  Chol:128 (08/13/2009), 177 (05/23/2009)  Trig:118.0 (08/13/2009), 96.0 (05/23/2009)  Orders: TLB-Lipid Panel (80061-LIPID) TLB-Hepatic/Liver Function Pnl (80076-HEPATIC)  Problem # 4:  OBSTRUCTIVE SLEEP APNEA (ICD-327.23) Assessment: Improved doing better on CPAP  Complete Medication List: 1)  Lisinopril 10 Mg Tabs (Lisinopril) .... One tablet daily at bedtime 2)  Lisinopril 20 Mg Tabs (Lisinopril) .[name].. 1 daily 3)  Toprol Xl 50  Mg Xr24h-tab (Metoprolol succinate) .[name].. 1 daily 4)  Crestor 5 Mg Tabs (Rosuvastatin calcium) .[name].. 1 daily 5)  Viagra 50 Mg Tabs (Sildenafil citrate) .... One dose as needed 1 hour prior to sexual activity 6)  Adult Aspirin Low Strength 81 Mg Tbdp (Aspirin) .... Take 1 tablet by mouth once a day 7)  Fish Oil Concentrate 1000 Mg Caps (Omega-3 fatty acids) .... Take 2 by mouth once daily 8)  Cpap   Other Orders: TLB-PSA (Prostate Specific Antigen) (84153-PSA)  Patient Instructions: 1)  Please schedule a follow-up appointment in 1 year for physical  Current Allergies (reviewed today): No known allergies

## 2010-09-29 NOTE — Letter (Signed)
Summary: CMN/HCS  CMN/HCS   Imported By: Lester Sharon 02/16/2010 10:41:45  _____________________________________________________________________  External Attachment:    Type:   Image     Comment:   External Document

## 2010-09-29 NOTE — Progress Notes (Signed)
Summary: lab work  Phone Note Call from Patient Call back at [employer] [phone]   Caller: Spouse Reason for Call: Talk to Nurse Summary of Call: req to speak to Dewayne Hatch, needs to know when he is to get blood work done again, aware Dewayne Hatch is out of ofc till Monday  Initial call taken by: Migdalia Dk,  October 10, 2009 3:47 PM  Follow-up for Phone Call        talked with pt-- pt will come for BMP next week

## 2010-09-29 NOTE — Progress Notes (Signed)
----   Converted from flag ---- ---- 09/24/2009 11:10 AM, Barbaraann Share MD wrote: Timothy Bernard, this pt needs sleep consult with me to followup on sleep study. let pt know dr. Shirlee Latch called me. ------------------------------  called and spoke with pt's wife.  wife scheduled pt an appt with KC for 10-03-2009 at 3pm.

## 2010-09-29 NOTE — Assessment & Plan Note (Signed)
Summary: rov for osa   Copy to:  Shirlee Latch Primary Provider/Referring Provider:  Cindee Salt MD  CC:  Pt is here for a 4 week f/u appt.  Pt states he is using his cpap machine every night.  Approx 6 hours per night.  Pt denied any complaints with mask or pressure. [name]  History of Present Illness: the pt comes in today for f/u of his osa.  He is wearing cpap compliantly, and has seen benefit in his sleep and daytime alertness.  His compliance download shows excellent use times.  He denies any issues with pressure or mask fit.  Medications Prior to Update: 1)  Adult Aspirin Low Strength 81 Mg  Tbdp (Aspirin) .... Take 1 Tablet By Mouth Once A Day 2)  Fish Oil Concentrate 1000 Mg Caps (Omega-3 Fatty Acids) .... Take 2 By Mouth Once Daily 3)  Lisinopril 20 Mg Tabs (Lisinopril) .[name].. 1 Daily 4)  Toprol Xl 50 Mg Xr24h-Tab (Metoprolol Succinate) .[name].. 1 Daily 5)  Crestor 5 Mg Tabs (Rosuvastatin Calcium) .[name].. 1 Daily 6)  Lisinopril 10 Mg Tabs (Lisinopril) .... One Tablet Daily At Bedtime 7)  Viagra 50 Mg Tabs (Sildenafil Citrate) .... One Dose As Needed 1 Hour Prior To Sexual Activity  Allergies (verified): No Known Drug Allergies  Review of Systems      See HPI  Vital Signs:  Patient profile:   55 year old male Height:      72 inches Weight:      265.50 pounds BMI:     36.14 O2 Sat:      93 % on Room air Temp:     98.1 degrees F oral Pulse rate:   66 / minute BP sitting:   132 / 84  (right arm) Cuff size:   large  Vitals Entered By: Arman Filter LPN (November 07, 2009 3:15 PM)  O2 Flow:  Room air  Physical Exam  General:  ow male in nad Nose:  no skin breakdown or pressure necrosis from cpap mask   Impression & Recommendations:  Problem # 1:  OBSTRUCTIVE SLEEP APNEA (ICD-327.23) the pt is doing very well with cpap, and reports no significant issues.  He is sleeping better, and has definite imrprovement in his daytime alertness.  At this point, would like to optimize his  pressure for him.  He had very little time left at split night for titrating, and it is unclear if that 12cm was enough.  Will let him know the results, and I have encouraged him to work on weight loss.  Other Orders: Est. Patient Level II (95284) DME Referral (DME)  Patient Instructions: 1)  will set on auto mode for 2 weeks to optimize your pressure.  I will let you know the results. 2)  work on weight loss 3)  if doing well, followup with me in 6mos.

## 2010-09-29 NOTE — Progress Notes (Signed)
Summary: pt need refill on crestor  Phone Note Refill Request Message from:  Patient on pls call to [facility]  Refills Requested: Medication #1:  CRESTOR 5 MG TABS 1 daily Initial call taken by: Omer Jack,  September 08, 2009 11:13 AM  Follow-up for Phone Call        Rx faxed to pharmacy Follow-up by: Vikki Ports,  September 08, 2009 11:23 AM    Prescriptions: CRESTOR 5 MG TABS (ROSUVASTATIN CALCIUM) 1 daily  #90 x 3   Entered by:   Vikki Ports   Authorized by:   Marca Ancona, MD   Signed by:   Vikki Ports on 09/08/2009   Method used:   Faxed to ...       [facility]* (mail-order)             ,          Ph: [phone]       Fax: [phone]   RxID:   [id number]

## 2010-09-29 NOTE — Progress Notes (Signed)
Summary: Patients At Home Vitals   Patients At Home Vitals   Imported By: Roderic Ovens 02/26/2010 15:48:50  _____________________________________________________________________  External Attachment:    Type:   Image     Comment:   External Document

## 2010-10-01 NOTE — Progress Notes (Signed)
Summary: Patients At Home Vitals   Patients At Home Vitals   Imported By: Roderic Ovens 08/05/2010 15:26:26  _____________________________________________________________________  External Attachment:    Type:   Image     Comment:   External Document

## 2010-10-19 ENCOUNTER — Encounter: Payer: Self-pay | Admitting: Cardiology

## 2010-10-19 ENCOUNTER — Other Ambulatory Visit (INDEPENDENT_AMBULATORY_CARE_PROVIDER_SITE_OTHER): Payer: 59

## 2010-10-19 ENCOUNTER — Other Ambulatory Visit: Payer: Self-pay | Admitting: Cardiology

## 2010-10-19 DIAGNOSIS — I712 Thoracic aortic aneurysm, without rupture: Secondary | ICD-10-CM

## 2010-10-19 LAB — BASIC METABOLIC PANEL
CO2: 27 mEq/L (ref 19–32)
Calcium: 8.9 mg/dL (ref 8.4–10.5)
Potassium: 4.5 mEq/L (ref 3.5–5.1)
Sodium: 140 mEq/L (ref 135–145)

## 2010-11-02 ENCOUNTER — Other Ambulatory Visit: Payer: Self-pay | Admitting: Cardiology

## 2010-11-02 ENCOUNTER — Ambulatory Visit (HOSPITAL_COMMUNITY)
Admission: RE | Admit: 2010-11-02 | Discharge: 2010-11-02 | Disposition: A | Discharge status: Home / Self Care | Payer: 59 | Source: Ambulatory Visit | Attending: Cardiology | Admitting: Cardiology

## 2010-11-02 DIAGNOSIS — I729 Aneurysm of unspecified site: Secondary | ICD-10-CM

## 2010-11-02 DIAGNOSIS — I719 Aortic aneurysm of unspecified site, without rupture: Secondary | ICD-10-CM

## 2010-11-02 DIAGNOSIS — I77810 Thoracic aortic ectasia: Secondary | ICD-10-CM | POA: Insufficient documentation

## 2010-11-02 DIAGNOSIS — Q231 Congenital insufficiency of aortic valve: Secondary | ICD-10-CM | POA: Insufficient documentation

## 2010-11-02 MED ORDER — GADOBENATE DIMEGLUMINE 529 MG/ML IV SOLN
20.0000 mL | Freq: Once | INTRAVENOUS | Status: AC
  Administered 2010-11-02: 20 mL via INTRAVENOUS

## 2010-11-12 ENCOUNTER — Other Ambulatory Visit (HOSPITAL_COMMUNITY): Payer: Self-pay

## 2010-12-03 ENCOUNTER — Other Ambulatory Visit (HOSPITAL_COMMUNITY): Payer: Self-pay

## 2011-01-12 NOTE — Assessment & Plan Note (Signed)
[facility]                         GASTROENTEROLOGY OFFICE NOTE   CORIAN, HANDLEY                        MRN:          [id number]  DATE:11/01/2007                            DOB:          October 23, 1955    PROBLEM:  Post polypectomy bleed.   REASON:  Timothy Bernard has returned following his brief hospitalization  for bleeding.  Colonoscopy on September 29, 2007, demonstrated sessile  ascending colon polyp.  He also had a cecal polyp.  Both were injected  with normal saline and then removed.  He developed rectal bleeding about  8 hours following the procedure requiring hospitalization.  Bleeding had  stopped spontaneously.  No transfusions were given.  He has had no  problems since that time.   PHYSICAL EXAMINATION:  Pulse 72.  Blood pressure 160/80.  Weight 267.   IMPRESSION:  1. Colonic polyposes.  2. Post colon polypectomy bleed.   RECOMMENDATIONS:  Follow up colonoscopy in approximately 5 years.     Barbette Hair. Arlyce Dice, MD,FACG  Electronically Signed    RDK/MedQ  DD: 11/01/2007  DT: 11/01/2007  Job #: [id number]   cc:   [name], MD

## 2011-01-12 NOTE — Assessment & Plan Note (Signed)
[facility]                                 ON-CALL NOTE   KAORU, REZENDES                        MRN:          [id number]  DATE:09/29/2007                            DOB:          1956-06-02    TELEPHONE MESSAGE:  This is Dr. Marzetta Board patient.  Mr. Isaacson is a 55-  year-old gentleman who had a colonoscopy by Dr. Arlyce Dice this morning.  His wife called at 9 p.m. with the patient having painless bloody stool.  She describes the amount to be more than 1 cup.  He denies any abdominal  pain, sweating, dizziness.  The patient came home around noontime and  had a meal then slept and had another meal around 7:00.  He is not  diaphoretic or dizzy.  The colonoscopy was a screening exam.  The  patient was on aspirin and ibuprofen until 1 week ago.  He has not taken  anticoagulants since then.  The patient's wife, Ms. Turman, read me  over the phone the colonoscopy report by Dr. Arlyce Dice who describes the  patient having 2 polyps, 1 in the right colon and 1 in the left colon,  which were both injected with saline and removed.   I advised Ms. Kuennen to watch Mr. Costabile over a period of the next  several hours.  If he has another two bloody stools she will head for  emergency room and call me at the same time so I can meet them in the  emergency room and most likely admit Mr. Lawrance for observation.  If  there are no more bloody stools the patient will wait and stay on  liquids and bowel rest tomorrow.     Hedwig Morton. Juanda Chance, MD  Electronically Signed    DMB/MedQ  DD: 09/29/2007  DT: 09/30/2007  Job #: [id number]   cc:   Barbette Hair. Arlyce Dice, MD,FACG

## 2011-01-22 ENCOUNTER — Other Ambulatory Visit (HOSPITAL_COMMUNITY): Payer: Self-pay | Admitting: Radiology

## 2011-02-01 ENCOUNTER — Ambulatory Visit (HOSPITAL_COMMUNITY): Payer: 59 | Attending: Internal Medicine | Admitting: Radiology

## 2011-02-01 ENCOUNTER — Other Ambulatory Visit (HOSPITAL_COMMUNITY): Payer: Self-pay | Admitting: Internal Medicine

## 2011-02-01 ENCOUNTER — Encounter: Payer: Self-pay | Admitting: Cardiology

## 2011-02-01 DIAGNOSIS — I359 Nonrheumatic aortic valve disorder, unspecified: Secondary | ICD-10-CM

## 2011-02-01 DIAGNOSIS — I350 Nonrheumatic aortic (valve) stenosis: Secondary | ICD-10-CM

## 2011-02-01 DIAGNOSIS — I079 Rheumatic tricuspid valve disease, unspecified: Secondary | ICD-10-CM | POA: Insufficient documentation

## 2011-02-01 DIAGNOSIS — I10 Essential (primary) hypertension: Secondary | ICD-10-CM | POA: Insufficient documentation

## 2011-02-01 DIAGNOSIS — E669 Obesity, unspecified: Secondary | ICD-10-CM | POA: Insufficient documentation

## 2011-02-01 DIAGNOSIS — I080 Rheumatic disorders of both mitral and aortic valves: Secondary | ICD-10-CM | POA: Insufficient documentation

## 2011-02-22 ENCOUNTER — Encounter: Payer: Self-pay | Admitting: Cardiology

## 2011-02-22 ENCOUNTER — Ambulatory Visit (INDEPENDENT_AMBULATORY_CARE_PROVIDER_SITE_OTHER): Payer: 59 | Admitting: Cardiology

## 2011-02-22 DIAGNOSIS — E785 Hyperlipidemia, unspecified: Secondary | ICD-10-CM

## 2011-02-22 DIAGNOSIS — I729 Aneurysm of unspecified site: Secondary | ICD-10-CM

## 2011-02-22 DIAGNOSIS — I712 Thoracic aortic aneurysm, without rupture, unspecified: Secondary | ICD-10-CM

## 2011-02-22 DIAGNOSIS — I10 Essential (primary) hypertension: Secondary | ICD-10-CM

## 2011-02-22 DIAGNOSIS — I359 Nonrheumatic aortic valve disorder, unspecified: Secondary | ICD-10-CM

## 2011-02-22 NOTE — Patient Instructions (Addendum)
Your physician recommends that you schedule a follow-up appointment in: LATE MARCH OR EARLY April WITH DR [facility]  AFTER MRA   Your physician recommends that you continue on your current medications as directed. Please refer to the Current Medication list given to you today. Your physician recommends that you return for lab work in: 1-2 WEEKS BMET LIPID  DX 272.4  401.9 MRA ANGIO OF CHEST   F/U  [facility] 2013

## 2011-02-23 NOTE — Assessment & Plan Note (Signed)
Patient has bicuspid aortic valve disorder with a dilated aortic root and ascending aorta.  The arch is not significantly dilated.   Last MRA showed ascending aorta measuring 4.6 cm (stable).  BP control will be important.  He will followup for MRA thoracic aorta in 3/13.  When it reaches 5 cm in size, he will need repair.

## 2011-02-23 NOTE — Assessment & Plan Note (Addendum)
BP is at goal. Continue current meds (lisinopril and Toprol XL).   Patient needs to lose weight with diet and exercise.  He should walk 30 minutes a day 4-5 times a week.

## 2011-02-23 NOTE — Progress Notes (Signed)
PCP: Dr. Alphonsus Sias  55 yo with bicuspid aortic valve disorder and dilated ascending aorta returns for evaluation. Aortic valve and aortic aneurysm were stable on last echo and MRA.  BP is under good control (122/72 today).  His home readings have been less than 130 systolic.  No chest pain or exertional dyspnea.  He is using CPAP.  Weight is up another 2 lbs.  He is not getting much formal exercise.   Labs (9/10): LDL 134, HDL 24 Labs (12/10): LDL 73, HDL 32 Labs (3/11): K 4.3, creatinine 1.3 Labs (2/12): K 4.5, creatinine 1.3  ECG: NSR, LAFB  Allergies:  No Known Drug Allergies  Past Medical History: 1. Hyperlipidemia 2. Hypertension 3. Colon Polyps--adenomatous  4. Post Polypectomy Bleed 5. Bicuspid aortic valve: Echo (5/10) with bicuspid AoV (upon re-review), mild LV dilation, EF 50-55%, no aortic stenosis, mild AI, moderate ascending aortic dilation (4.8 cm).  Bicuspid valve confirmed by MRI.  Echo (3/11) bicuspid aortic valve with no AS but mild AI, mild LVH, EF 60%.  Echo (6/12): Mild LV hypertrophy, EF 55-60%, mild AI, no AS, mild MR.  6. Ascending aortic aneurysm: 4.8 cm on echo (5/10), 4.6 x 4.6 cm by MRA 6/10, 4.6 cm aortic root and ascending aorta by MRA 9/10.  MRA chest (3/11) 4.5 cm ascending aorta.  MRA chest (3/12): 4.6 cm ascending aorta.  7. 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 aspirin 81 MG tablet Take 81 mg by mouth daily.        . Flaxseed, Linseed, (FLAX SEED OIL) 1000 MG CAPS Take by mouth daily.        [name] lisinopril (PRINIVIL,ZESTRIL) 10 MG tablet Take 10 mg by mouth at bedtime.        [name] lisinopril (PRINIVIL,ZESTRIL) 20 MG tablet  Take 20 mg by mouth daily.        . metoprolol (TOPROL-XL) 50 MG 24 hr tablet Take 50 mg by mouth daily.        . rosuvastatin (CRESTOR) 5 MG tablet Take 5 mg by mouth daily.        . sildenafil (VIAGRA) 50 MG tablet Take 50 mg by mouth daily as needed. 1 hour prior to sexual activity.         BP 122/72  Pulse 70  Ht 6' (1.829 m)  Wt 271 lb 1.9 oz (122.979 kg)  BMI 36.77 kg/m2 General:  Well developed, well nourished, in no acute distress.  Obese.  Neck:  Neck supple, no JVD. No masses, thyromegaly or abnormal cervical nodes. Lungs:  Clear bilaterally to auscultation and percussion. Heart:  Non-displaced PMI, chest non-tender; regular rate and rhythm, S1, S2 without rubs or gallops. 1/6 early systolic ejection murmur RUSB.  Carotid upstroke normal, no bruit. Pedals normal pulses. 1+ ankle edema.  Abdomen:  Bowel sounds positive; abdomen soft and non-tender without masses, organomegaly, or hernias noted. No hepatosplenomegaly. Extremities:  No clubbing or cyanosis. Neurologic:  Alert and oriented x 3. Psych:  Normal affect.

## 2011-02-23 NOTE — Assessment & Plan Note (Signed)
Bicuspid aortic valve with no significant AS and mild AI.  Can repeat echo 2 years (6/14).

## 2011-02-26 ENCOUNTER — Other Ambulatory Visit: Payer: Self-pay | Admitting: Cardiology

## 2011-02-26 NOTE — Telephone Encounter (Signed)
I will forward to Dr McLean for OK. 

## 2011-03-09 ENCOUNTER — Other Ambulatory Visit (INDEPENDENT_AMBULATORY_CARE_PROVIDER_SITE_OTHER): Payer: 59 | Admitting: *Deleted

## 2011-03-09 DIAGNOSIS — E785 Hyperlipidemia, unspecified: Secondary | ICD-10-CM

## 2011-03-09 DIAGNOSIS — I10 Essential (primary) hypertension: Secondary | ICD-10-CM

## 2011-03-09 LAB — BASIC METABOLIC PANEL
BUN: 11 mg/dL (ref 6–23)
Creatinine, Ser: 1.2 mg/dL (ref 0.4–1.5)
GFR: 68.75 mL/min (ref >60.00)
Potassium: 4.6 mEq/L (ref 3.5–5.1)

## 2011-03-09 LAB — LIPID PANEL
Cholesterol: 128 mg/dL (ref 0–200)
Triglycerides: 148 mg/dL (ref 0.0–149.0)

## 2011-03-19 ENCOUNTER — Other Ambulatory Visit: Payer: Self-pay | Admitting: Cardiology

## 2011-03-23 ENCOUNTER — Telehealth: Payer: Self-pay | Admitting: Cardiology

## 2011-03-23 NOTE — Telephone Encounter (Signed)
They need clarification on lisinopril 20mg  qd ID # [id number] they need a response by thursday

## 2011-03-23 NOTE — Telephone Encounter (Signed)
Dr Shirlee Latch office note from 02/22/11 indicates pt taking Lisinopril 20mg  daily and Lisinopril 10mg  daily. I have given Timothy Bernard at [facility] a prescription for Lisinopril 20mg  #90 3 refills.

## 2011-04-09 ENCOUNTER — Other Ambulatory Visit: Payer: Self-pay | Admitting: Cardiology

## 2011-05-07 ENCOUNTER — Encounter: Payer: Self-pay | Admitting: Pulmonary Disease

## 2011-05-07 ENCOUNTER — Ambulatory Visit (INDEPENDENT_AMBULATORY_CARE_PROVIDER_SITE_OTHER): Payer: 59 | Admitting: Pulmonary Disease

## 2011-05-07 VITALS — BP 118/60 | HR 62 | Temp 98.7°F | Ht 72.0 in | Wt 276.8 lb

## 2011-05-07 DIAGNOSIS — G4733 Obstructive sleep apnea (adult) (pediatric): Secondary | ICD-10-CM

## 2011-05-07 NOTE — Assessment & Plan Note (Addendum)
The patient is doing well with CPAP for treatment of his moderate sleep apnea.  He feels that he is sleeping well, and denies any significant alertness issues during the day.  He has gained weight since the last visit, and I've explained to him this may worsen his sleep apnea and subsequently increase his CPAP pressure needs.  He would like to hold off on pressure reoptimization, and we'll work aggressively on weight loss.

## 2011-05-07 NOTE — Progress Notes (Signed)
  Subjective:    Patient ID: Timothy Bernard, male    DOB: May 04, 1956, 55 y.o.   MRN: [id number]  HPI The patient comes in today for followup of his sleep apnea.  He is wearing CPAP compliantly, and feels that he is sleeping fairly well with adequate daytime alertness.  He is having no mask or pressure issues.  He has put on weight since his last visit, and they feel that his alertness is not quite as good as it has been in the past.  He is trying to work on weight loss.   Review of Systems  Constitutional: Positive for unexpected weight change. Negative for fever.  HENT: Negative for ear pain, nosebleeds, congestion, sore throat, rhinorrhea, sneezing, trouble swallowing, dental problem, postnasal drip and sinus pressure.   Eyes: Negative for redness and itching.  Respiratory: Negative for cough, chest tightness, shortness of breath and wheezing.   Cardiovascular: Negative for palpitations and leg swelling.  Gastrointestinal: Negative for nausea and vomiting.  Genitourinary: Negative for dysuria.  Musculoskeletal: Negative for joint swelling.  Skin: Negative for rash.  Neurological: Negative for headaches.  Hematological: Does not bruise/bleed easily.  Psychiatric/Behavioral: Negative for dysphoric mood. The patient is not nervous/anxious.        Objective:   Physical Exam Overweight male in no acute distress No skin breakdown or pressure necrosis from the CPAP mask Lower extremities without edema, no cyanosis noted Alert and oriented, does not appear sleepy, moves all 4 extremities.       Assessment & Plan:

## 2011-05-07 NOTE — Patient Instructions (Signed)
Continue with cpap, keep up with mask changes and supplies Work on weight loss.  If you feel you are not sleeping as well, we can reoptimize your pressure at home followup with me in one year.

## 2011-05-20 LAB — URINALYSIS, ROUTINE W REFLEX MICROSCOPIC
Glucose, UA: NEGATIVE
Ketones, ur: NEGATIVE
Nitrite: NEGATIVE
Protein, ur: NEGATIVE

## 2011-05-20 LAB — COMPREHENSIVE METABOLIC PANEL WITH GFR
ALT: 22
AST: 21
Albumin: 3 — ABNORMAL LOW
Alkaline Phosphatase: 61
Chloride: 110
GFR calc Af Amer: >60
Potassium: 4.5
Sodium: 139
Total Bilirubin: 0.8
Total Protein: 5.8 — ABNORMAL LOW

## 2011-05-20 LAB — CBC
HCT: 42.1
Hemoglobin: 14.2
MCHC: 33.8
MCV: 89.2
Platelets: 233
RBC: 4.72
RDW: 12.6
WBC: 12.2 — ABNORMAL HIGH

## 2011-05-20 LAB — CROSSMATCH: ABO/RH(D): O POS

## 2011-05-20 LAB — DIFFERENTIAL
Basophils Absolute: 0.1
Basophils Relative: 1
Eosinophils Absolute: 0
Eosinophils Relative: 0
Lymphocytes Relative: 20
Lymphs Abs: 2.4
Monocytes Absolute: 0.6
Monocytes Relative: 5
Neutro Abs: 9.1 — ABNORMAL HIGH
Neutrophils Relative %: 74

## 2011-05-20 LAB — SAMPLE TO BLOOD BANK

## 2011-05-20 LAB — COMPREHENSIVE METABOLIC PANEL
BUN: 12
CO2: 24
Calcium: 7.9 — ABNORMAL LOW
Creatinine, Ser: 1.32
GFR calc non Af Amer: 57 — ABNORMAL LOW
Glucose, Bld: 146 — ABNORMAL HIGH

## 2011-05-20 LAB — HEMOGLOBIN AND HEMATOCRIT, BLOOD
HCT: 38.3 — ABNORMAL LOW
HCT: 38.7 — ABNORMAL LOW
Hemoglobin: 13.2
Hemoglobin: 13.3

## 2011-05-20 LAB — ABO/RH: ABO/RH(D): O POS

## 2011-05-20 LAB — PROTIME-INR
INR: 1.1
Prothrombin Time: 14.9

## 2011-05-21 LAB — HEMOGLOBIN AND HEMATOCRIT, BLOOD: HCT: 37.8 — ABNORMAL LOW

## 2011-07-31 DIAGNOSIS — IMO0001 Reserved for inherently not codable concepts without codable children: Secondary | ICD-10-CM

## 2011-07-31 HISTORY — DX: Reserved for inherently not codable concepts without codable children: IMO0001

## 2011-08-17 ENCOUNTER — Encounter: Payer: Self-pay | Admitting: Internal Medicine

## 2011-08-17 ENCOUNTER — Ambulatory Visit (INDEPENDENT_AMBULATORY_CARE_PROVIDER_SITE_OTHER): Payer: 59 | Admitting: Internal Medicine

## 2011-08-17 VITALS — BP 120/70 | HR 68 | Temp 98.0°F | Ht 72.0 in | Wt 258.0 lb

## 2011-08-17 DIAGNOSIS — I10 Essential (primary) hypertension: Secondary | ICD-10-CM

## 2011-08-17 DIAGNOSIS — Z0000 Encounter for general adult medical examination without abnormal findings: Secondary | ICD-10-CM | POA: Insufficient documentation

## 2011-08-17 DIAGNOSIS — R7301 Impaired fasting glucose: Secondary | ICD-10-CM

## 2011-08-17 DIAGNOSIS — E785 Hyperlipidemia, unspecified: Secondary | ICD-10-CM

## 2011-08-17 LAB — CBC WITH DIFFERENTIAL/PLATELET
Basophils Absolute: 0 10*3/uL (ref 0.0–0.1)
Basophils Relative: 0.3 % (ref 0.0–3.0)
Eosinophils Absolute: 0.1 10*3/uL (ref 0.0–0.7)
Lymphocytes Relative: 38.1 % (ref 12.0–46.0)
MCHC: 34.2 g/dL (ref 30.0–36.0)
Neutrophils Relative %: 54.9 % (ref 43.0–77.0)
RBC: 5.06 Mil/uL (ref 4.22–5.81)

## 2011-08-17 LAB — HEPATIC FUNCTION PANEL
Alkaline Phosphatase: 124 U/L — ABNORMAL HIGH (ref 39–117)
Bilirubin, Direct: 0.1 mg/dL (ref 0.0–0.3)
Total Bilirubin: 0.3 mg/dL (ref 0.3–1.2)
Total Protein: 7.2 g/dL (ref 6.0–8.3)

## 2011-08-17 LAB — HEMOGLOBIN A1C: Hgb A1c MFr Bld: 13.8 % — ABNORMAL HIGH (ref 4.6–6.5)

## 2011-08-17 NOTE — Assessment & Plan Note (Signed)
BP Readings from Last 3 Encounters:  08/17/11 120/70  05/07/11 118/60  02/22/11 122/72   Continues to have good control No changes needed

## 2011-08-17 NOTE — Assessment & Plan Note (Signed)
Will recheck and do A1c Will set up diabetic counselling in [place] if still elevated

## 2011-08-17 NOTE — Assessment & Plan Note (Signed)
Lab Results  Component Value Date   LDLCALC 69 03/09/2011   At goal No changes needed

## 2011-08-17 NOTE — Assessment & Plan Note (Signed)
UTD on imms and colon Will check PSA after discussion Really needs to work on fitness

## 2011-08-17 NOTE — Progress Notes (Signed)
Subjective:    Patient ID: Timothy Bernard, male    DOB: 23-May-1956, 55 y.o.   MRN: [id number]  HPI Doing well No new concerns  Recent visit with Dr Shirlee Latch Echo showed stable aortic valve and aneurysmal dilatation   Still uses CPAP--no problems with machine Saw Dr Micah Noel recently  No consistent with exercise Variable weight---back down a bit but still up 5# from last year  Current Outpatient Prescriptions on File Prior to Visit  Medication Sig Dispense Refill  . aspirin 81 MG tablet Take 81 mg by mouth daily.        . CRESTOR 5 MG tablet TAKE 1 TABLET DAILY  90 tablet  2  . Flaxseed, Linseed, (FLAX SEED OIL) 1000 MG CAPS Take by mouth daily.        [name] lisinopril (PRINIVIL,ZESTRIL) 10 MG tablet TAKE 1 TABLET DAILY AT BEDTIME  90 tablet  1  . lisinopril (PRINIVIL,ZESTRIL) 20 MG tablet Take 20 mg by mouth daily.        . metoprolol (TOPROL-XL) 50 MG 24 hr tablet TAKE 1 TABLET DAILY  90 tablet  1  . VIAGRA 50 MG tablet TAKE 1 TALBET 1 HOUR PRIOR TO SEXUAL ACTIVITY AS NEEDED  20 tablet  1    No Known Allergies  Past Medical History  Diagnosis Date  . Hyperlipidemia   . HTN (hypertension)   . Colon polyps     adenomatous  . S/P cervical polypectomy     post polypectomy bleed. type of polypectomy unspecified.  . Bicuspid aortic valve     echo (5/10) with bicuspid AoV (upon re-review), mild LV dilation, EF 50-55%, no aortic stenosis, mild AI, moderate ascending aortic dilation (4.8 cm). 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 Echo (3/11) bicuspid aortic valve with no AS but mild AI, mild LVH, EF 60%.    . Ascending aortic aneurysm     4.8 cm on ech (5/10), 4.6 x 4.6 cm by MRA 6/10 4.6 cm aortic root and ascending aorta by MRA 9/10. MRA chest (3/11) 4.5 ascending aorta.   . OSA on CPAP     No past surgical history on file.  Family History  Problem Relation Age of Onset  . Hypertension Mother     HTN on mothers side.   . Coronary artery disease Maternal Grandfather   . Colon cancer Neg  Hx   . Prostate cancer Neg Hx     History   Social History  . Marital Status: Married    Spouse Name: N/A    Number of Children: N/A  . Years of Education: N/A   Occupational History  . Comptroller    Social History Main Topics  . Smoking status: Never Smoker   . Smokeless tobacco: Never Used  . Alcohol Use: No  . Drug Use: No  . Sexually Active: Not on file   Other Topics Concern  . Not on file   Social History Narrative   Comptroller. 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 Pt signed designated party release form and gives Verne Lanuza, spouse [phone], access to medical records. Can leave msg on answering machine.    Review of Systems  Constitutional: Positive for fatigue and unexpected weight change.       Some mild fatigue on the BP med---not a big deal Wears seat belt   HENT: Positive for congestion and rhinorrhea. Negative for hearing loss, dental problem and tinnitus.        Mild allergy symptoms--hasn't needed meds Regular  with dentist  Eyes: Negative for visual disturbance.       No diplopia or unilateral vision loss  Respiratory: Negative for cough, chest tightness and shortness of breath.   Cardiovascular: Negative for chest pain, palpitations and leg swelling.  Gastrointestinal: Negative for nausea, vomiting, abdominal pain, constipation and blood in stool.       No heartburn  Genitourinary: Negative for dysuria, urgency, frequency and difficulty urinating.       Occ nocturia No sexual problems  Musculoskeletal: Positive for arthralgias. Negative for back pain and joint swelling.       Some knee stiffness  Skin: Negative for rash.       No suspicious lesions  Neurological: Negative for dizziness, syncope, weakness, light-headedness, numbness and headaches.  Hematological: Negative for adenopathy. Does not bruise/bleed easily.  Psychiatric/Behavioral: Negative for sleep disturbance and dysphoric mood. The patient is not  nervous/anxious.        Objective:   Physical Exam  Constitutional: He is oriented to person, place, and time. He appears well-developed and well-nourished. No distress.  HENT:  Head: Normocephalic and atraumatic.  Right Ear: External ear normal.  Left Ear: External ear normal.  Mouth/Throat: Oropharynx is clear and moist. No oropharyngeal exudate.       TMs normal  Eyes: Conjunctivae and EOM are normal. Pupils are equal, round, and reactive to light.       Fundi benign  Neck: Normal range of motion. Neck supple. No thyromegaly present.  Cardiovascular: Normal rate, regular rhythm, normal heart sounds and intact distal pulses.  Exam reveals no gallop.   No murmur heard. Pulmonary/Chest: Effort normal and breath sounds normal. No respiratory distress. He has no wheezes. He has no rales.  Abdominal: Soft. There is no tenderness.  Musculoskeletal: Normal range of motion. He exhibits no edema and no tenderness.  Lymphadenopathy:    He has no cervical adenopathy.  Neurological: He is alert and oriented to person, place, and time.  Skin: No rash noted. No erythema.  Psychiatric: He has a normal mood and affect. His behavior is normal. Judgment and thought content normal.          Assessment & Plan:

## 2011-08-23 ENCOUNTER — Encounter: Payer: Self-pay | Admitting: *Deleted

## 2011-08-23 ENCOUNTER — Other Ambulatory Visit: Payer: Self-pay | Admitting: Cardiology

## 2011-08-23 ENCOUNTER — Telehealth: Payer: Self-pay | Admitting: *Deleted

## 2011-08-23 MED ORDER — METFORMIN HCL 1000 MG PO TABS: 1000.0000 mg | ORAL_TABLET | Freq: Two times a day (BID) | ORAL | Status: DC

## 2011-08-23 NOTE — Telephone Encounter (Signed)
Message copied by Sueanne Margarita on Mon Aug 23, 2011  9:24 AM ------      Message from: Tillman Abide I      Created: Wed Aug 18, 2011  2:39 PM       Please call      The blood work unfortunately does indicate true diabetes with the sugar up to 305.      Please start metformin 1000mg  bid (#60 x 11)      Try 1/2 tab bid for the first 2 days and if no stomach trouble or diarrhea, increase to a full tab      Set up an appt next week to review this in greater depth

## 2011-08-23 NOTE — Telephone Encounter (Signed)
letter mailed to patients home address with results. Spoke with patient and advised results rx sent to pharmacy by e-script

## 2011-08-25 ENCOUNTER — Encounter: Payer: Self-pay | Admitting: Cardiology

## 2011-08-25 NOTE — Telephone Encounter (Signed)
error 

## 2011-08-26 ENCOUNTER — Ambulatory Visit (INDEPENDENT_AMBULATORY_CARE_PROVIDER_SITE_OTHER): Payer: 59 | Admitting: Internal Medicine

## 2011-08-26 ENCOUNTER — Encounter: Payer: Self-pay | Admitting: Internal Medicine

## 2011-08-26 VITALS — BP 140/70 | HR 74 | Temp 97.8°F | Ht 72.0 in | Wt 255.0 lb

## 2011-08-26 DIAGNOSIS — E119 Type 2 diabetes mellitus without complications: Secondary | ICD-10-CM

## 2011-08-26 DIAGNOSIS — IMO0001 Reserved for inherently not codable concepts without codable children: Secondary | ICD-10-CM

## 2011-08-26 NOTE — Assessment & Plan Note (Addendum)
New diabetic Discussed his genetic predisposition as well as eating poorly and abdominal obesity Doing okay with metformin Will set up with diabetic counselor---requests [facility]  counselled all of 15 minute visit

## 2011-08-26 NOTE — Progress Notes (Signed)
  Subjective:    Patient ID: Timothy Bernard, male    DOB: 1956/03/22, 55 y.o.   MRN: [id number]  HPI Here with wife Has tolerated the metformin fairly well Diarrhea only the first or second day with the full tablet occ nausea but not consistent  Appetite is normal  No vision changes  Had noted some polyuria and polydipsia--but this is better on the medication   Review of Systems     Objective:   Physical Exam        Assessment & Plan:

## 2011-09-09 ENCOUNTER — Encounter: Payer: Self-pay | Admitting: *Deleted

## 2011-09-09 ENCOUNTER — Encounter: Payer: 59 | Attending: Internal Medicine | Admitting: *Deleted

## 2011-09-09 DIAGNOSIS — Z713 Dietary counseling and surveillance: Secondary | ICD-10-CM | POA: Insufficient documentation

## 2011-09-09 DIAGNOSIS — IMO0001 Reserved for inherently not codable concepts without codable children: Secondary | ICD-10-CM

## 2011-09-09 DIAGNOSIS — E119 Type 2 diabetes mellitus without complications: Secondary | ICD-10-CM | POA: Insufficient documentation

## 2011-09-09 NOTE — Progress Notes (Signed)
  Medical Nutrition Therapy:  Appt start time: 1630 end time:  1800.   Assessment:  Primary concerns today: Patient here with his wife who appears supportive. He was recently diagnosed with Type 2 Diabetes with fairly sudden onset. They are here for initial diabetes education. He does not have a meter yet, will be provided today. They have been severely restricting all foods containing carbohydrate so far. He states a strong dislike to any non-nutritive sweetener.  A1c at diagnosis was 13.8%  MEDICATIONS: see list. Diabetes medication is currently Metformin   DIETARY INTAKE:  Usual eating pattern includes 3 meals and 0-1 snacks per day.  Everyday foods include good variety of most food groups.  Avoided foods include non-nutritive sweeteners, strong dislike for these.    24-hr recall:  B ( AM): 1 egg, whole grain toast, regular oats with blueberries, water,   Snk ( AM): granola bar  L ( PM): eat out - salads with chicken, OR Japenese 1/week, sweet tea Snk ( PM): none D ( PM): both cook, eat out 2-3 times/week, meat, starch, vegetable or salad, decreasing bread choices, water or gator-aid Snk ( PM): none Beverages: water, sweet tea, gator-aid  Usual physical activity: none planned. Walks a lot at work, yard and house work  Estimated energy needs: 1600 calories 180 g carbohydrates 120 g protein 44 g fat  Progress Towards Goal(s):  In progress.   Nutritional Diagnosis:  NI-5.8.4 Inconsistent carbohydrate intake As related to new diagnosis of diabetes.  As evidenced by A1c of 13.8%.    Intervention:  Nutrition counseling and diabetes education provided. Discussed basic physiology of diabetes, self-monitoring with meter provided as well as instruction on use of meter, Carb Counting and value of consistent carb meals, value of increasing activity level as tolerated. Plan: Aim for 3 Carb Choices per meal (+/- 1) and 0-1 per snack if hungry  Read Food Labels for Total Carb of  foods  Continue to check BG as directed by MD and consider post meal on occasion to evaluate meal plan Consider options to increase activity to assist in BG management Consider ways to decrease amount or frequency of sugar in sweet tea  Handouts given during visit include:  Living Well with Diabetes  Carb Counting and Food Label handouts  Provided Accu Chek Nano meter and trained on it's use. Instructed patient to contact insurance company as to preferred meter in regard to reimbursement for the strips, in case it is different.  Meter Lot # :[id number] Expiration Date: 11/27/2012  Monitoring/Evaluation:  Dietary intake, exercise, self monitoring of BG, and body weight prn. Patient to call for appointment based on his work schedule.

## 2011-09-13 ENCOUNTER — Other Ambulatory Visit: Payer: Self-pay | Admitting: *Deleted

## 2011-09-13 DIAGNOSIS — IMO0001 Reserved for inherently not codable concepts without codable children: Secondary | ICD-10-CM

## 2011-09-13 MED ORDER — ONETOUCH ULTRASOFT LANCETS MISC: 1.0000 | Freq: Every day | Status: DC

## 2011-09-13 MED ORDER — ONETOUCH ULTRA SYSTEM W/DEVICE KIT: 1.0000 | PACK | Freq: Once | Status: DC

## 2011-09-13 MED ORDER — GLUCOSE BLOOD VI STRP: 1.0000 | ORAL_STRIP | Freq: Every day | Status: DC

## 2011-09-13 NOTE — Patient Instructions (Signed)
Plan: Aim for 3 Carb Choices per meal (+/- 1) and 0-1 per snack if hungry  Read Food Labels for Total Carb of foods  Continue to check BG as directed by MD and consider post meal on occasion to evaluate meal plan Consider options to increase activity to assist in BG management

## 2011-11-08 ENCOUNTER — Encounter: Payer: 59 | Attending: Internal Medicine | Admitting: *Deleted

## 2011-11-08 ENCOUNTER — Encounter: Payer: Self-pay | Admitting: *Deleted

## 2011-11-08 DIAGNOSIS — IMO0001 Reserved for inherently not codable concepts without codable children: Secondary | ICD-10-CM

## 2011-11-08 DIAGNOSIS — Z713 Dietary counseling and surveillance: Secondary | ICD-10-CM | POA: Insufficient documentation

## 2011-11-08 DIAGNOSIS — E119 Type 2 diabetes mellitus without complications: Secondary | ICD-10-CM | POA: Insufficient documentation

## 2011-11-08 NOTE — Progress Notes (Signed)
  Medical Nutrition Therapy:  Appt start time: 1730 end time:  1800.   Assessment:  Primary concerns today: follow up to initial diabetes education. Wife brought in BG log sheet that indicates average BG of 130 for past month including pre and post meal numbers. He states he is averaging 30-45 grams carbohydrate at each meal and has lost 5 pounds since last visit. Plans to see MD in early April for next A1c test.  MEDICATIONS: see list. Diabetes medication is currently Metformin   DIETARY INTAKE:  Usual eating pattern includes 3 meals and 0-1 snacks per day.  Everyday foods include good variety of most food groups.  Avoided foods include non-nutritive sweeteners, strong dislike for these.    24-hr recall:  B ( AM): 1 egg, whole grain toast, regular oats with blueberries, water,   Snk ( AM): granola bar  L ( PM): eat out - salads with chicken, OR Japenese 1/week, water Snk ( PM): none D ( PM): both cook, eat out 2-3 times/week, meat, starch, vegetable or salad, decreasing bread choices, water or gator-aid Snk ( PM): none Beverages: water   Usual physical activity: none planned. Walks a lot at work, yard and house work  Estimated energy needs: 1600 calories 180 g carbohydrates 120 g protein 44 g fat  Progress Towards Goal(s):  In progress.   Nutritional Diagnosis:  NI-5.8.4 Inconsistent carbohydrate intake As related to new diagnosis of diabetes.  As evidenced by A1c of 13.8%.    Intervention:  Reviewed BG Log sheet and complimented him on his successes. Provided target BG ranges for pre and post meals, answered questions on dining out and encouraged ongoing physical activity  Plan: Aim for 3 Carb Choices per meal (+/- 1) and 0-1 per snack if hungry  Read Food Labels for Total Carb of foods  Continue to check BG as directed by MD and consider post meal on occasion to evaluate meal plan Consider options to increase activity to assist in BG management Continue drinking water in  place of sweet tea  Handouts given during visit include:  Living Well with Diabetes  My Personal Diabetes Health Card  Monitoring/Evaluation:  Dietary intake, exercise, self monitoring of BG, and body weight prn.

## 2011-11-22 ENCOUNTER — Inpatient Hospital Stay (HOSPITAL_COMMUNITY): Admission: RE | Admit: 2011-11-22 | Payer: 59 | Source: Ambulatory Visit

## 2011-11-22 ENCOUNTER — Telehealth: Payer: Self-pay | Admitting: Cardiology

## 2011-11-22 DIAGNOSIS — I712 Thoracic aortic aneurysm, without rupture: Secondary | ICD-10-CM

## 2011-11-22 NOTE — Telephone Encounter (Signed)
FU Call: Pt wife returning call to [facility]. Please return pt wife call to discuss further.

## 2011-11-22 NOTE — Telephone Encounter (Signed)
LMTCB

## 2011-11-22 NOTE — Telephone Encounter (Signed)
Pt's wife asking if it is OK for pt  to continue metformin since he is having an MRA. Per Dr Wilmon Pali to continue metformin for MRA. I will ask PCCs to call pt to reschedule MRA. Spoke with pt's wife.

## 2011-11-22 NOTE — Telephone Encounter (Signed)
New Msg: Pt wife calling wanting to speak with nurse/MD regarding pt PCP Md prescribing metformin 1000mg , 2 x/day. Pt wife wanted to make sure Dr. Shirlee Latch was aware of this and approved.  [facility] cancelled pt MRA due to scheduling conflict; therefore pt MRA needs to be rs. Please return pt wife call to discuss further.

## 2011-11-22 NOTE — Telephone Encounter (Signed)
Reviewed with Dr Shirlee Latch. Ok to take metformin. LMTCB

## 2011-11-24 ENCOUNTER — Other Ambulatory Visit: Payer: Self-pay | Admitting: *Deleted

## 2011-11-24 DIAGNOSIS — I712 Thoracic aortic aneurysm, without rupture: Secondary | ICD-10-CM

## 2011-11-25 ENCOUNTER — Other Ambulatory Visit (INDEPENDENT_AMBULATORY_CARE_PROVIDER_SITE_OTHER): Payer: 59

## 2011-11-25 DIAGNOSIS — I712 Thoracic aortic aneurysm, without rupture: Secondary | ICD-10-CM

## 2011-11-25 LAB — BASIC METABOLIC PANEL
CO2: 27 mEq/L (ref 19–32)
Chloride: 107 mEq/L (ref 96–112)
Glucose, Bld: 112 mg/dL — ABNORMAL HIGH (ref 70–99)
Potassium: 4.4 mEq/L (ref 3.5–5.1)
Sodium: 140 mEq/L (ref 135–145)

## 2011-11-26 ENCOUNTER — Ambulatory Visit: Payer: 59 | Admitting: Internal Medicine

## 2011-11-29 ENCOUNTER — Ambulatory Visit (HOSPITAL_COMMUNITY)
Admission: RE | Admit: 2011-11-29 | Discharge: 2011-11-29 | Disposition: A | Discharge status: Home / Self Care | Payer: 59 | Source: Ambulatory Visit | Attending: Cardiology | Admitting: Cardiology

## 2011-11-29 DIAGNOSIS — Q231 Congenital insufficiency of aortic valve: Secondary | ICD-10-CM | POA: Insufficient documentation

## 2011-11-29 DIAGNOSIS — I359 Nonrheumatic aortic valve disorder, unspecified: Secondary | ICD-10-CM

## 2011-11-29 DIAGNOSIS — I712 Thoracic aortic aneurysm, without rupture, unspecified: Secondary | ICD-10-CM | POA: Insufficient documentation

## 2011-11-29 MED ORDER — GADOBENATE DIMEGLUMINE 529 MG/ML IV SOLN
20.0000 mL | Freq: Once | INTRAVENOUS | Status: AC
  Administered 2011-11-29: 20 mL via INTRAVENOUS

## 2011-12-01 ENCOUNTER — Telehealth: Payer: Self-pay | Admitting: Cardiology

## 2011-12-01 NOTE — Telephone Encounter (Signed)
Spoke with wife

## 2011-12-01 NOTE — Telephone Encounter (Signed)
New msg Pt's wife called and wanted to discuss mra results

## 2011-12-03 ENCOUNTER — Encounter: Payer: Self-pay | Admitting: Internal Medicine

## 2011-12-03 ENCOUNTER — Ambulatory Visit (INDEPENDENT_AMBULATORY_CARE_PROVIDER_SITE_OTHER): Payer: 59 | Admitting: Internal Medicine

## 2011-12-03 VITALS — BP 112/70 | HR 60 | Temp 97.8°F | Ht 72.0 in | Wt 245.0 lb

## 2011-12-03 DIAGNOSIS — E119 Type 2 diabetes mellitus without complications: Secondary | ICD-10-CM

## 2011-12-03 NOTE — Progress Notes (Signed)
Subjective:    Patient ID: Timothy Bernard, male    DOB: 1955/10/12, 56 y.o.   MRN: [id number]  HPI Has done diabetic counseling No problems with metformin  Checking sugars bid AM pretty much under 110 Usually 100-160 at other times No hypoglycemic reactions  Trying to do more exercise but no set routine Walks, yardwork but not consistent  Current Outpatient Prescriptions on File Prior to Visit  Medication Sig Dispense Refill  . aspirin 81 MG tablet Take 81 mg by mouth daily.        . CRESTOR 5 MG tablet TAKE 1 TABLET DAILY  90 tablet  2  . Flaxseed, Linseed, (FLAX SEED OIL) 1000 MG CAPS Take by mouth daily.        [name] glucose blood (ONE TOUCH ULTRA TEST) test strip 1 each by Other route daily. Dx: 250.00  100 each  11  . Lancets (ONETOUCH ULTRASOFT) lancets 1 each by Other route daily. Dx:250.00  100 each  11  . lisinopril (PRINIVIL,ZESTRIL) 10 MG tablet TAKE 1 TABLET DAILY AT BEDTIME  90 tablet  2  . lisinopril (PRINIVIL,ZESTRIL) 20 MG tablet Take 20 mg by mouth every morning.        . metFORMIN (GLUCOPHAGE) 1000 MG tablet Take 1 tablet (1,000 mg total) by mouth 2 (two) times daily with a meal.  60 tablet  11  . metoprolol (TOPROL-XL) 50 MG 24 hr tablet TAKE 1 TABLET DAILY  90 tablet  2  . DISCONTD: VIAGRA 50 MG tablet TAKE 1 TALBET 1 HOUR PRIOR TO SEXUAL ACTIVITY AS NEEDED  20 tablet  1    No Known Allergies  Past Medical History  Diagnosis Date  . Hyperlipidemia   . HTN (hypertension)   . Colon polyps     adenomatous  . S/P cervical polypectomy     post polypectomy bleed. type of polypectomy unspecified.  . Bicuspid aortic valve     echo (5/10) with bicuspid AoV (upon re-review), mild LV dilation, EF 50-55%, no aortic stenosis, mild AI, moderate ascending aortic dilation (4.8 cm). 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 Echo (3/11) bicuspid aortic valve with no AS but mild AI, mild LVH, EF 60%.    . Ascending aortic aneurysm     4.8 cm on ech (5/10), 4.6 x 4.6 cm by MRA 6/10 4.6  cm aortic root and ascending aorta by MRA 9/10. MRA chest (3/11) 4.5 ascending aorta.   . OSA on CPAP   . 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 Ascending aortic aneurysm     No past surgical history on file.  Family History  Problem Relation Age of Onset  . Hypertension Mother     HTN on mothers side.   . Coronary artery disease Maternal Grandfather   . Colon cancer Neg Hx   . Prostate cancer Neg Hx     History   Social History  . Marital Status: Married    Spouse Name: N/A    Number of Children: N/A  . Years of Education: N/A   Occupational History  . Comptroller    Social History Main Topics  . Smoking status: Never Smoker   . Smokeless tobacco: Never Used  . Alcohol Use: No  . Drug Use: No  . Sexually Active: Not on file   Other Topics Concern  . Not on file   Social History Narrative   Comptroller. 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 Pt signed designated party release form and gives Alona Bene  Domeier, spouse [phone], access to medical records. Can leave msg on answering machine.    Review of Systems Sleeps well Weight down ~4#     Objective:   Physical Exam  Constitutional: He appears well-developed and well-nourished. No distress.  Psychiatric: He has a normal mood and affect. His behavior is normal.          Assessment & Plan:

## 2011-12-03 NOTE — Assessment & Plan Note (Signed)
Finger stick log reviewed Doing very well now No side effects Counseled on weight loss, increased activity (most of 15 minute visit) Will check A1c

## 2011-12-04 LAB — HEMOGLOBIN A1C: Hgb A1c MFr Bld: 6.7 % — ABNORMAL HIGH (ref <5.7)

## 2011-12-07 ENCOUNTER — Encounter: Payer: Self-pay | Admitting: *Deleted

## 2011-12-12 ENCOUNTER — Other Ambulatory Visit: Payer: Self-pay | Admitting: Cardiology

## 2011-12-13 NOTE — Telephone Encounter (Signed)
..   Requested Prescriptions   Pending Prescriptions Disp Refills  . CRESTOR 5 MG tablet [Pharmacy Med Name: CRESTOR TABS 5MG ] 90 tablet 1    Sig: TAKE 1 TABLET DAILY

## 2011-12-31 ENCOUNTER — Ambulatory Visit (INDEPENDENT_AMBULATORY_CARE_PROVIDER_SITE_OTHER): Payer: 59 | Admitting: Cardiology

## 2011-12-31 ENCOUNTER — Encounter: Payer: Self-pay | Admitting: Cardiology

## 2011-12-31 VITALS — BP 149/82 | HR 65 | Resp 18 | Ht 72.0 in | Wt 245.8 lb

## 2011-12-31 DIAGNOSIS — I359 Nonrheumatic aortic valve disorder, unspecified: Secondary | ICD-10-CM

## 2011-12-31 DIAGNOSIS — E785 Hyperlipidemia, unspecified: Secondary | ICD-10-CM

## 2011-12-31 DIAGNOSIS — I712 Thoracic aortic aneurysm, without rupture: Secondary | ICD-10-CM

## 2011-12-31 DIAGNOSIS — I10 Essential (primary) hypertension: Secondary | ICD-10-CM

## 2011-12-31 DIAGNOSIS — R9431 Abnormal electrocardiogram [ECG] [EKG]: Secondary | ICD-10-CM

## 2011-12-31 NOTE — Assessment & Plan Note (Signed)
Bicuspid aortic valve with no significant AS and mild AI on echo in 2012.  Repeat echo in 6/14.

## 2011-12-31 NOTE — Assessment & Plan Note (Signed)
Stable.  Repeat MRA chest in 3/14.

## 2011-12-31 NOTE — Assessment & Plan Note (Signed)
Excellent BP numbers from home.  Mildly elevated in office today.  Will continue current regimen.  Important to have good BP control with ascending aortic aneurysm.

## 2011-12-31 NOTE — Assessment & Plan Note (Signed)
Check lipids/LFTs.  

## 2011-12-31 NOTE — Patient Instructions (Signed)
Your physician recommends that you return for lab work this month for fasting cholesterol and lfts.  We will call you with your results  Your physician wants you to follow-up in: 6 months with Dr. Shirlee Latch. You will receive a reminder letter in the mail two months in advance. If you don't receive a letter, please call our office to schedule the follow-up appointment.  Your physician has requested that you have an echocardiogram. Echocardiography is a painless test that uses sound waves to create images of your heart. It provides your doctor with information about the size and shape of your heart and how well your heart's chambers and valves are working. This procedure takes approximately one hour. There are no restrictions for this procedure. June 2014   MRA in March of 2014

## 2011-12-31 NOTE — Progress Notes (Signed)
PCP: Dr. Alphonsus Sias  56 yo with bicuspid aortic valve disorder and dilated ascending aorta returns for evaluation.  He has been doing well recently.  He has been dieting and has lost 26 lbs since last appointment.  He has been diagnosed with diabetes, but since losing weight, his hemoglobin A1c is much better.  BP has been under good control at home (checks on most days and has list of readings with him).  It is mildly elevated in the office today, 149/82.  MRA of aorta was done in 3/13, with stable 4.5 cm dilation of the ascending aorta.  No chest pain, no exertional dyspnea.   Labs (9/10): LDL 134, HDL 24 Labs (12/10): LDL 73, HDL 32 Labs (3/11): K 4.3, creatinine 1.3 Labs (2/12): K 4.5, creatinine 1.3 Labs (3/13): K 4.4, creatinine 1.1  ECG: NSR, LAFB, poor anterior R wave progression, Qs in V1 and V2  Allergies:  No Known Drug Allergies  Past Medical History: 1. Hyperlipidemia 2. Hypertension 3. Colon Polyps--adenomatous  4. Post Polypectomy Bleed 5. Bicuspid aortic valve: Echo (5/10) with bicuspid AoV (upon re-review), mild LV dilation, EF 50-55%, no aortic stenosis, mild AI, moderate ascending aortic dilation (4.8 cm).  Bicuspid valve confirmed by MRI.  Echo (3/11) bicuspid aortic valve with no AS but mild AI, mild LVH, EF 60%.  Echo (6/12): Mild LV hypertrophy, EF 55-60%, mild AI, no AS, mild MR.  6. Ascending aortic aneurysm: 4.8 cm on echo (5/10), 4.6 x 4.6 cm by MRA 6/10, 4.6 cm aortic root and ascending aorta by MRA 9/10.  MRA chest (3/11) 4.5 cm ascending aorta.  MRA chest (3/12): 4.6 cm ascending aorta. MRA chest (3/13): 4.5 cm ascending aortic aneurysm.  7. 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   Current Outpatient Prescriptions  Medication Sig Dispense Refill  . aspirin 81 MG tablet Take 81 mg by mouth daily.        . CRESTOR 5 MG tablet TAKE 1 TABLET DAILY  90 tablet  1  . Flaxseed, Linseed, (FLAX SEED OIL) 1000 MG CAPS Take by mouth daily.        [name] glucose blood (ONE TOUCH ULTRA TEST) test strip 1 each by Other route daily. Dx: 250.00  100 each  11  . Lancets (ONETOUCH ULTRASOFT) lancets 1 each by Other route daily. Dx:250.00  100 each  11  . lisinopril (PRINIVIL,ZESTRIL) 10 MG tablet TAKE 1 TABLET DAILY AT BEDTIME  90 tablet  2  . lisinopril (PRINIVIL,ZESTRIL) 20 MG tablet Take 20 mg by mouth every morning.        . metFORMIN (GLUCOPHAGE) 1000 MG tablet Take 1 tablet (1,000 mg total) by mouth 2 (two) times daily with a meal.  60 tablet  11  . metoprolol (TOPROL-XL) 50 MG 24 hr tablet TAKE 1 TABLET DAILY  90 tablet  2  . sildenafil (VIAGRA) 50 MG tablet Take 50 mg by mouth daily as needed.        BP 149/82  Pulse 65  Resp 18  Ht 6' (1.829 m)  Wt 245 lb 12.8 oz (111.494 kg)  BMI 33.34 kg/m2 General:  Well developed, well nourished,  in no acute distress.  Obese.  Neck:  Neck supple, no JVD. No masses, thyromegaly or abnormal cervical nodes. Lungs:  Clear bilaterally to auscultation and percussion. Heart:  Non-displaced PMI, chest non-tender; regular rate and rhythm, S1, S2 without rubs or gallops. 1/6 early systolic ejection murmur RUSB.  Carotid upstroke normal, no bruit. Pedals normal pulses. No edema.  Abdomen:  Bowel sounds positive; abdomen soft and non-tender without masses, organomegaly, or hernias noted. No hepatosplenomegaly. Extremities:  No clubbing or cyanosis. Neurologic:  Alert and oriented x 3. Psych:  Normal affect.

## 2012-01-05 ENCOUNTER — Other Ambulatory Visit: Payer: Self-pay | Admitting: Cardiology

## 2012-01-06 MED ORDER — SILDENAFIL CITRATE 100 MG PO TABS: 50.0000 mg | ORAL_TABLET | Freq: Every day | ORAL | Status: DC | PRN

## 2012-01-06 NOTE — Telephone Encounter (Signed)
Spoke with patient and he did want the 100mg , rx sent to pharmacy by e-script

## 2012-01-06 NOTE — Telephone Encounter (Signed)
Please check with him I recommend the 100mg  tab and he can cut approximately in half. Much cheaper that way Can refill #10 x 5

## 2012-01-14 ENCOUNTER — Other Ambulatory Visit: Payer: 59

## 2012-01-17 ENCOUNTER — Ambulatory Visit (INDEPENDENT_AMBULATORY_CARE_PROVIDER_SITE_OTHER): Payer: 59 | Admitting: *Deleted

## 2012-01-17 DIAGNOSIS — I10 Essential (primary) hypertension: Secondary | ICD-10-CM

## 2012-01-17 DIAGNOSIS — E785 Hyperlipidemia, unspecified: Secondary | ICD-10-CM

## 2012-01-17 LAB — LIPID PANEL
HDL: 33 mg/dL — ABNORMAL LOW (ref >39.00)
Total CHOL/HDL Ratio: 3
Triglycerides: 106 mg/dL (ref 0.0–149.0)

## 2012-01-17 LAB — HEPATIC FUNCTION PANEL
ALT: 22 U/L (ref 0–53)
AST: 21 U/L (ref 0–37)
Albumin: 3.6 g/dL (ref 3.5–5.2)

## 2012-01-19 ENCOUNTER — Telehealth: Payer: Self-pay | Admitting: *Deleted

## 2012-01-19 NOTE — Telephone Encounter (Signed)
Left message to continue same dose of medications

## 2012-01-19 NOTE — Telephone Encounter (Signed)
Message copied by Burnell Blanks on Wed Jan 19, 2012  8:29 AM ------      Message from: Laurey Morale      Created: Wed Jan 19, 2012  8:16 AM       Good lipids

## 2012-01-27 ENCOUNTER — Other Ambulatory Visit: Payer: Self-pay | Admitting: Cardiology

## 2012-04-19 ENCOUNTER — Encounter: Payer: 59 | Admitting: Internal Medicine

## 2012-04-26 ENCOUNTER — Other Ambulatory Visit: Payer: Self-pay | Admitting: Cardiology

## 2012-04-26 NOTE — Telephone Encounter (Signed)
Fax Received. Refill Completed. [employer] [place] (R.M.A)   

## 2012-05-10 ENCOUNTER — Ambulatory Visit (INDEPENDENT_AMBULATORY_CARE_PROVIDER_SITE_OTHER): Payer: 59 | Admitting: Pulmonary Disease

## 2012-05-10 ENCOUNTER — Encounter: Payer: Self-pay | Admitting: Pulmonary Disease

## 2012-05-10 VITALS — BP 112/68 | HR 65 | Temp 98.7°F | Ht 72.0 in | Wt 249.0 lb

## 2012-05-10 DIAGNOSIS — G4733 Obstructive sleep apnea (adult) (pediatric): Secondary | ICD-10-CM

## 2012-05-10 NOTE — Patient Instructions (Addendum)
Continue working on weight loss.  You are doing great.  Stay on cpap, keep up with mask changes and supplies If you lose another 20 pounds before the next visit, please call so we can re-optimize your pressure.  followup with me in one year.

## 2012-05-10 NOTE — Assessment & Plan Note (Signed)
The patient is doing very well on CPAP therapy, with no tolerance issues.  He is sleeping well, and is satisfied with his daytime alertness.  Most importantly, he has lost 27 pounds since last visit, and I've asked him to continue.  He will followup with me in one year, but I've asked him to call me if he loses another 20 pounds so we can re\re optimize his pressure.

## 2012-05-10 NOTE — Progress Notes (Signed)
  Subjective:    Patient ID: Timothy Bernard, male    DOB: May 10, 1956, 56 y.o.   MRN: [id number]  HPI The patient comes in today for followup of his known obstructive sleep apnea.  He is wearing CPAP very compliantly by his download, and has excellent control of his AHI.  He has no significant leaks, and feels that his mask fits well.  He has lost 27 pounds since last visit, and I have congratulated him on this.  He feels that he is sleeping well, and does not have daytime alertness issues.   Review of Systems  Constitutional: Negative for fever and unexpected weight change.  HENT: Negative for ear pain, nosebleeds, congestion, sore throat, rhinorrhea, sneezing, trouble swallowing, dental problem, postnasal drip and sinus pressure.   Eyes: Negative for redness and itching.  Respiratory: Negative for cough, chest tightness, shortness of breath and wheezing.   Cardiovascular: Negative for palpitations and leg swelling.  Gastrointestinal: Negative for nausea and vomiting.  Genitourinary: Negative for dysuria.  Musculoskeletal: Negative for joint swelling.  Skin: Negative for rash.  Neurological: Negative for headaches.  Hematological: Does not bruise/bleed easily.  Psychiatric/Behavioral: Negative for dysphoric mood. The patient is not nervous/anxious.        Objective:   Physical Exam Overweight male in no acute distress Nose without purulence or discharge noted No skin breakdown or pressure necrosis from the CPAP mask Lower extremities without edema, no cyanosis Alert and oriented, moves all 4 extremities.       Assessment & Plan:

## 2012-07-14 ENCOUNTER — Encounter: Payer: Self-pay | Admitting: Internal Medicine

## 2012-07-14 ENCOUNTER — Encounter: Payer: 59 | Admitting: Internal Medicine

## 2012-07-14 ENCOUNTER — Ambulatory Visit (INDEPENDENT_AMBULATORY_CARE_PROVIDER_SITE_OTHER): Payer: 59 | Admitting: Internal Medicine

## 2012-07-14 VITALS — BP 112/78 | HR 66 | Temp 97.8°F | Ht 72.0 in | Wt 247.0 lb

## 2012-07-14 DIAGNOSIS — Z23 Encounter for immunization: Secondary | ICD-10-CM

## 2012-07-14 DIAGNOSIS — E785 Hyperlipidemia, unspecified: Secondary | ICD-10-CM

## 2012-07-14 DIAGNOSIS — I10 Essential (primary) hypertension: Secondary | ICD-10-CM

## 2012-07-14 DIAGNOSIS — Z0000 Encounter for general adult medical examination without abnormal findings: Secondary | ICD-10-CM

## 2012-07-14 DIAGNOSIS — E119 Type 2 diabetes mellitus without complications: Secondary | ICD-10-CM

## 2012-07-14 LAB — HEMOGLOBIN A1C: Hgb A1c MFr Bld: 6.7 % — ABNORMAL HIGH (ref 4.6–6.5)

## 2012-07-14 NOTE — Assessment & Plan Note (Signed)
Lab Results  Component Value Date   LDLCALC 50 01/17/2012   No problems with the med

## 2012-07-14 NOTE — Addendum Note (Signed)
Addended by: Sueanne Margarita on: 07/14/2012 10:26 AM   Modules accepted: Orders

## 2012-07-14 NOTE — Progress Notes (Signed)
Subjective:    Patient ID: Timothy Bernard, male    DOB: Feb 23, 1956, 56 y.o.   MRN: [id number]  HPI Here for physical Has gotten used to diabetic eating  Trying to exercise---walking regularly but discussed increased intensity and time  Checks sugars every other day Fasting generally under 120---average 118 No hypoglycemic reactions Due for eye exam next week--Dr Jimmey Ralph  Recent visit for OSA Doing well with CPAP -13 (he thinks)  No heart problems  Current Outpatient Prescriptions on File Prior to Visit  Medication Sig Dispense Refill  . aspirin 81 MG tablet Take 81 mg by mouth daily.        . CRESTOR 5 MG tablet TAKE 1 TABLET DAILY  90 tablet  1  . Flaxseed, Linseed, (FLAX SEED OIL) 1000 MG CAPS Take by mouth daily.        [name] glucose blood (ONE TOUCH ULTRA TEST) test strip 1 each by Other route daily. Dx: 250.00  100 each  11  . Lancets (ONETOUCH ULTRASOFT) lancets 1 each by Other route daily. Dx:250.00  100 each  11  . lisinopril (PRINIVIL,ZESTRIL) 10 MG tablet TAKE 1 TABLET DAILY AT BEDTIME  90 tablet  3  . metFORMIN (GLUCOPHAGE) 1000 MG tablet Take 1 tablet (1,000 mg total) by mouth 2 (two) times daily with a meal.  60 tablet  11  . metoprolol succinate (TOPROL-XL) 50 MG 24 hr tablet TAKE 1 TABLET DAILY  90 tablet  3  . sildenafil (VIAGRA) 100 MG tablet Take 0.5 tablets (50 mg total) by mouth daily as needed.  10 tablet  5    No Known Allergies  Past Medical History  Diagnosis Date  . Hyperlipidemia   . HTN (hypertension)   . Colon polyps     adenomatous  . S/P cervical polypectomy     post polypectomy bleed. type of polypectomy unspecified.  . Bicuspid aortic valve     echo (5/10) with bicuspid AoV (upon re-review), mild LV dilation, EF 50-55%, no aortic stenosis, mild AI, moderate ascending aortic dilation (4.8 cm). 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 Echo (3/11) bicuspid aortic valve with no AS but mild AI, mild LVH, EF 60%.    . Ascending aortic aneurysm     4.8 cm on  ech (5/10), 4.6 x 4.6 cm by MRA 6/10 4.6 cm aortic root and ascending aorta by MRA 9/10. MRA chest (3/11) 4.5 ascending aorta.   . OSA on CPAP   . 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 Ascending aortic aneurysm     No past surgical history on file.  Family History  Problem Relation Age of Onset  . Hypertension Mother     HTN on mothers side.   . Coronary artery disease Maternal Grandfather   . Colon cancer Neg Hx   . Prostate cancer Neg Hx     History   Social History  . Marital Status: Married    Spouse Name: N/A    Number of Children: N/A  . Years of Education: N/A   Occupational History  . Comptroller    Social History Main Topics  . Smoking status: Never Smoker   . Smokeless tobacco: Never Used  . Alcohol Use: No  . Drug Use: No  . Sexually Active: Not on file   Other Topics Concern  . Not on file   Social History Narrative   Comptroller. 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 Pt signed designated party release form and gives Dareus Sundet, spouse  [phone], access to medical records. Can leave msg on answering machine.    Review of Systems  Constitutional: Negative for fatigue and unexpected weight change.       Wears seat belt  HENT: Negative for hearing loss, congestion, rhinorrhea, dental problem and tinnitus.        Keeps up with dentist---getting crown  Eyes: Negative for visual disturbance.       No diplopia or unilateral vision loss  Respiratory: Negative for cough, chest tightness and shortness of breath.   Cardiovascular: Negative for chest pain, palpitations and leg swelling.  Gastrointestinal: Negative for nausea, vomiting, abdominal pain, constipation and blood in stool.       No heartburn  Genitourinary: Negative for urgency, frequency and difficulty urinating.       Satisfied with viagra for ED  Musculoskeletal: Negative for back pain, joint swelling and arthralgias.  Skin: Negative for rash.       No  suspicious lesions  Neurological: Negative for dizziness, syncope, light-headedness, numbness and headaches.  Hematological: Negative for adenopathy. Does not bruise/bleed easily.  Psychiatric/Behavioral: Negative for sleep disturbance and dysphoric mood. The patient is not nervous/anxious.        Objective:   Physical Exam  Constitutional: He is oriented to person, place, and time. He appears well-developed and well-nourished. No distress.  HENT:  Head: Normocephalic and atraumatic.  Right Ear: External ear normal.  Left Ear: External ear normal.  Mouth/Throat: Oropharynx is clear and moist. No oropharyngeal exudate.  Eyes: Conjunctivae normal and EOM are normal. Pupils are equal, round, and reactive to light.  Neck: Normal range of motion. Neck supple. No thyromegaly present.  Cardiovascular: Normal rate, regular rhythm, normal heart sounds and intact distal pulses.  Exam reveals no gallop.   No murmur heard. Pulmonary/Chest: Effort normal and breath sounds normal. No respiratory distress. He has no wheezes. He has no rales.  Abdominal: Soft. There is no tenderness.  Musculoskeletal: Normal range of motion. He exhibits no edema and no tenderness.  Lymphadenopathy:    He has no cervical adenopathy.  Neurological: He is alert and oriented to person, place, and time.  Skin: No rash noted. No erythema.  Psychiatric: He has a normal mood and affect. His behavior is normal.          Assessment & Plan:

## 2012-07-14 NOTE — Assessment & Plan Note (Signed)
BP Readings from Last 3 Encounters:  07/14/12 112/78  05/10/12 112/68  12/31/11 149/82   Good control No changes needed

## 2012-07-14 NOTE — Assessment & Plan Note (Signed)
Seems to have good control Due for labs 

## 2012-07-14 NOTE — Assessment & Plan Note (Signed)
Has improved lifestyle Discussed increasing workouts and maybe some weight work as well Pneumovax and flu Will defer PSA to next year

## 2012-07-17 ENCOUNTER — Encounter: Payer: Self-pay | Admitting: *Deleted

## 2012-08-10 ENCOUNTER — Other Ambulatory Visit: Payer: Self-pay | Admitting: *Deleted

## 2012-08-10 MED ORDER — ROSUVASTATIN CALCIUM 5 MG PO TABS: 5.0000 mg | ORAL_TABLET | Freq: Every day | ORAL | Status: DC

## 2012-08-18 ENCOUNTER — Other Ambulatory Visit: Payer: Self-pay | Admitting: *Deleted

## 2012-08-18 MED ORDER — METFORMIN HCL 1000 MG PO TABS: 1000.0000 mg | ORAL_TABLET | Freq: Two times a day (BID) | ORAL | Status: DC

## 2012-08-29 ENCOUNTER — Telehealth: Payer: Self-pay | Admitting: Cardiology

## 2012-08-29 NOTE — Telephone Encounter (Signed)
New Problem: ° ° ° °I called the patient and was unable to reach them. I left a message on their voicemail with my name, the reason I called, the name of their physician, and a number to call back to schedule their appointment. ° °

## 2012-10-13 ENCOUNTER — Ambulatory Visit: Payer: 59 | Admitting: Cardiology

## 2012-10-19 ENCOUNTER — Other Ambulatory Visit: Payer: Self-pay | Admitting: Cardiology

## 2012-11-22 ENCOUNTER — Encounter: Payer: Self-pay | Admitting: *Deleted

## 2012-11-22 ENCOUNTER — Encounter: Payer: Self-pay | Admitting: Cardiology

## 2012-11-22 ENCOUNTER — Ambulatory Visit (INDEPENDENT_AMBULATORY_CARE_PROVIDER_SITE_OTHER): Payer: 59 | Admitting: Cardiology

## 2012-11-22 VITALS — BP 130/80 | HR 71 | Ht 72.0 in | Wt 259.0 lb

## 2012-11-22 DIAGNOSIS — I712 Thoracic aortic aneurysm, without rupture, unspecified: Secondary | ICD-10-CM

## 2012-11-22 DIAGNOSIS — E785 Hyperlipidemia, unspecified: Secondary | ICD-10-CM

## 2012-11-22 DIAGNOSIS — I359 Nonrheumatic aortic valve disorder, unspecified: Secondary | ICD-10-CM

## 2012-11-22 DIAGNOSIS — I10 Essential (primary) hypertension: Secondary | ICD-10-CM

## 2012-11-22 NOTE — Patient Instructions (Addendum)
Your physician recommends that you return for a FASTING lipid profile /BMET.   Schedule an appointment for an MRA of your chest.   Your physician has requested that you have an echocardiogram. Echocardiography is a painless test that uses sound waves to create images of your heart. It provides your doctor with information about the size and shape of your heart and how well your heart's chambers and valves are working. This procedure takes approximately one hour. There are no restrictions for this procedure. June 2014  Your physician wants you to follow-up in: 6 months with Dr Shirlee Latch.You will receive a reminder letter in the mail two months in advance. If you don't receive a letter, please call our office to schedule the follow-up appointment.

## 2012-11-23 NOTE — Progress Notes (Signed)
Patient ID: Timothy Bernard, male   DOB: March 20, 1956, 57 y.o.   MRN: [id number] PCP: Dr. Alphonsus Sias  57 yo with bicuspid aortic valve disorder and dilated ascending aorta returns for evaluation.  Weight is back up 14 lbs as he has not been very active over the winter.  BP has been under good control at home.  No exertional chest pain or dyspnea.  He walks for about a mile at a time for exercise.  MRA of aorta was done in 3/13, with stable 4.5 cm dilation of the ascending aorta.    Labs (9/10): LDL 134, HDL 24 Labs (12/10): LDL 73, HDL 32 Labs (3/11): K 4.3, creatinine 1.3 Labs (2/12): K 4.5, creatinine 1.3 Labs (3/13): K 4.4, creatinine 1.1 Labs (5/13): LDL 50, HDL 33  ECG: NSR, normal  Allergies:  No Known Drug Allergies  Past Medical History: 1. Hyperlipidemia 2. Hypertension 3. Colon Polyps--adenomatous  4. Post Polypectomy Bleed 5. Bicuspid aortic valve: Echo (5/10) with bicuspid AoV (upon re-review), mild LV dilation, EF 50-55%, no aortic stenosis, mild AI, moderate ascending aortic dilation (4.8 cm).  Bicuspid valve confirmed by MRI.  Echo (3/11) bicuspid aortic valve with no AS but mild AI, mild LVH, EF 60%.  Echo (6/12): Mild LV hypertrophy, EF 55-60%, mild AI, no AS, mild MR.  6. Ascending aortic aneurysm: 4.8 cm on echo (5/10), 4.6 x 4.6 cm by MRA 6/10, 4.6 cm aortic root and ascending aorta by MRA 9/10.  MRA chest (3/11) 4.5 cm ascending aorta.  MRA chest (3/12): 4.6 cm ascending aorta. MRA chest (3/13): 4.5 cm ascending aortic aneurysm.  7. 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   Current Outpatient Prescriptions  Medication Sig Dispense Refill  . aspirin 81  MG tablet Take 81 mg by mouth daily.        . CRESTOR 5 MG tablet Take 1 tablet by mouth  daily  90 tablet  1  . Flaxseed, Linseed, (FLAX SEED OIL) 1000 MG CAPS Take by mouth daily.        [name] glucose blood (ONE TOUCH ULTRA TEST) test strip 1 each by Other route daily. Dx: 250.00  100 each  11  . Lancets (ONETOUCH ULTRASOFT) lancets 1 each by Other route daily. Dx:250.00  100 each  11  . lisinopril (PRINIVIL,ZESTRIL) 10 MG tablet TAKE 1 TABLET DAILY AT BEDTIME  90 tablet  3  . lisinopril (PRINIVIL,ZESTRIL) 20 MG tablet Take 20 mg by mouth daily.      . metFORMIN (GLUCOPHAGE) 1000 MG tablet Take 1 tablet (1,000 mg total) by mouth 2 (two) times daily with a meal.  60 tablet  6  . metoprolol succinate (TOPROL-XL) 50 MG 24 hr tablet TAKE 1 TABLET DAILY  90 tablet  3  . sildenafil (VIAGRA) 100 MG tablet Take 0.5 tablets (50 mg total) by mouth daily as needed.  10 tablet  5   No current facility-administered medications for this visit.    BP 130/80  Pulse 71  Ht 6' (1.829 m)  Wt 259 lb (117.482 kg)  BMI 35.12 kg/m2 General:  Well developed, well nourished, in no acute distress.  Obese.  Neck:  Neck supple, no JVD. No masses, thyromegaly or abnormal cervical nodes. Lungs:  Clear bilaterally to auscultation and percussion. Heart:  Non-displaced PMI, chest non-tender; regular rate and rhythm, S1, S2 without rubs or gallops. 2/6 early systolic ejection murmur RUSB.  Carotid upstroke normal, no bruit. Pedals normal pulses. No edema.  Abdomen:  Bowel sounds positive; abdomen soft and non-tender without masses, organomegaly, or hernias noted. No hepatosplenomegaly. Extremities:  No clubbing or cyanosis. Neurologic:  Alert and oriented x 3. Psych:  Normal affect.  Assessment/Plan: 1. Bicuspid aortic valve: Mild AI on last echo with no significant AS.  Due for repeat echo in 6/14.  2. Ascending aortic aneurysm: 4.5 cm when last measured, stable.  Related to bicuspid aortic valve.  Needs good BP control.   Will get MRA chest to followup on aneurysm.  Continue Toprol XL and lisinopril.  3. HTN: BP is well-controlled.  4. Hyperlipidemia: Check lipids today.   Marca Ancona 11/23/2012 11:19 PM

## 2012-11-27 ENCOUNTER — Other Ambulatory Visit (INDEPENDENT_AMBULATORY_CARE_PROVIDER_SITE_OTHER): Payer: 59

## 2012-11-27 DIAGNOSIS — I359 Nonrheumatic aortic valve disorder, unspecified: Secondary | ICD-10-CM

## 2012-11-27 DIAGNOSIS — I712 Thoracic aortic aneurysm, without rupture, unspecified: Secondary | ICD-10-CM

## 2012-11-27 LAB — BASIC METABOLIC PANEL
CO2: 24 mEq/L (ref 19–32)
Glucose, Bld: 149 mg/dL — ABNORMAL HIGH (ref 70–99)
Potassium: 4.3 mEq/L (ref 3.5–5.1)
Sodium: 138 mEq/L (ref 135–145)

## 2012-11-27 LAB — LIPID PANEL: HDL: 28.6 mg/dL — ABNORMAL LOW (ref >39.00)

## 2012-11-30 ENCOUNTER — Telehealth: Payer: Self-pay | Admitting: Cardiology

## 2012-11-30 NOTE — Telephone Encounter (Signed)
Follow Up   Calling in following up on phone call from earlier. Please call.

## 2012-11-30 NOTE — Telephone Encounter (Signed)
Spoke with pt's wife about recent lab results

## 2012-12-04 ENCOUNTER — Ambulatory Visit (HOSPITAL_COMMUNITY)
Admission: RE | Admit: 2012-12-04 | Discharge: 2012-12-04 | Disposition: A | Discharge status: Home / Self Care | Payer: 59 | Source: Ambulatory Visit | Attending: Cardiology | Admitting: Cardiology

## 2012-12-04 DIAGNOSIS — Q231 Congenital insufficiency of aortic valve: Secondary | ICD-10-CM | POA: Insufficient documentation

## 2012-12-04 DIAGNOSIS — I719 Aortic aneurysm of unspecified site, without rupture: Secondary | ICD-10-CM

## 2012-12-04 DIAGNOSIS — I359 Nonrheumatic aortic valve disorder, unspecified: Secondary | ICD-10-CM | POA: Insufficient documentation

## 2012-12-04 DIAGNOSIS — I77810 Thoracic aortic ectasia: Secondary | ICD-10-CM | POA: Insufficient documentation

## 2012-12-04 DIAGNOSIS — I712 Thoracic aortic aneurysm, without rupture: Secondary | ICD-10-CM

## 2012-12-04 MED ORDER — GADOBENATE DIMEGLUMINE 529 MG/ML IV SOLN
20.0000 mL | Freq: Once | INTRAVENOUS | Status: AC
  Administered 2012-12-04: 20 mL via INTRAVENOUS

## 2012-12-20 ENCOUNTER — Other Ambulatory Visit: Payer: Self-pay

## 2012-12-22 ENCOUNTER — Telehealth: Payer: Self-pay | Admitting: *Deleted

## 2012-12-22 MED ORDER — LISINOPRIL 20 MG PO TABS: 20.0000 mg | ORAL_TABLET | Freq: Every day | ORAL | Status: DC

## 2012-12-22 NOTE — Telephone Encounter (Signed)
Calling patient to verify if he is taking Lisinopril 10mg  , Lisinopril 20mg , or is he taking both? Patient stated he is taking both and said thank you for calling and verifying. Lisinopril 20mg  once daily by mouth, #90, 1RF sent to [facility]   Micki Riley, CMA

## 2013-01-26 ENCOUNTER — Ambulatory Visit (INDEPENDENT_AMBULATORY_CARE_PROVIDER_SITE_OTHER): Payer: 59 | Admitting: Internal Medicine

## 2013-01-26 ENCOUNTER — Encounter: Payer: Self-pay | Admitting: Internal Medicine

## 2013-01-26 VITALS — BP 130/80 | HR 63 | Ht 72.0 in | Wt 252.0 lb

## 2013-01-26 DIAGNOSIS — E785 Hyperlipidemia, unspecified: Secondary | ICD-10-CM

## 2013-01-26 DIAGNOSIS — E119 Type 2 diabetes mellitus without complications: Secondary | ICD-10-CM

## 2013-01-26 DIAGNOSIS — Z125 Encounter for screening for malignant neoplasm of prostate: Secondary | ICD-10-CM

## 2013-01-26 DIAGNOSIS — I10 Essential (primary) hypertension: Secondary | ICD-10-CM

## 2013-01-26 DIAGNOSIS — Z0000 Encounter for general adult medical examination without abnormal findings: Secondary | ICD-10-CM

## 2013-01-26 NOTE — Assessment & Plan Note (Signed)
Discussed fitness and weight loss Will check PSA

## 2013-01-26 NOTE — Assessment & Plan Note (Signed)
Still seems to have good control Due for A1c 

## 2013-01-26 NOTE — Assessment & Plan Note (Signed)
Lab Results  Component Value Date   LDLCALC 60 11/27/2012   No problems with the med

## 2013-01-26 NOTE — Assessment & Plan Note (Signed)
BP Readings from Last 3 Encounters:  01/26/13 130/80  11/22/12 130/80  07/14/12 112/78   Good control

## 2013-01-26 NOTE — Progress Notes (Signed)
Subjective:    Patient ID: Timothy Bernard, male    DOB: 26-Nov-1955, 57 y.o.   MRN: [id number]  HPI Has to move his physical forward to earlier in the year  Keeps up with cardiology Valve seems to be stable (and aorta)  Checks sugars every other day 120's usually No low sugar reactions Trying to walk regularly--but just a mile. Discussed Comfortable with the diabetic diet training  Current Outpatient Prescriptions on File Prior to Visit  Medication Sig Dispense Refill  . aspirin 81 MG tablet Take 81 mg by mouth daily.        . CRESTOR 5 MG tablet Take 1 tablet by mouth  daily  90 tablet  1  . Flaxseed, Linseed, (FLAX SEED OIL) 1000 MG CAPS Take by mouth daily.        [name] glucose blood (ONE TOUCH ULTRA TEST) test strip 1 each by Other route daily. Dx: 250.00  100 each  11  . Lancets (ONETOUCH ULTRASOFT) lancets 1 each by Other route daily. Dx:250.00  100 each  11  . lisinopril (PRINIVIL,ZESTRIL) 10 MG tablet TAKE 1 TABLET DAILY AT BEDTIME  90 tablet  3  . lisinopril (PRINIVIL,ZESTRIL) 20 MG tablet Take 1 tablet (20 mg total) by mouth daily.  90 tablet  1  . metFORMIN (GLUCOPHAGE) 1000 MG tablet Take 1 tablet (1,000 mg total) by mouth 2 (two) times daily with a meal.  60 tablet  6  . metoprolol succinate (TOPROL-XL) 50 MG 24 hr tablet TAKE 1 TABLET DAILY  90 tablet  3  . sildenafil (VIAGRA) 100 MG tablet Take 0.5 tablets (50 mg total) by mouth daily as needed.  10 tablet  5   No current facility-administered medications on file prior to visit.    No Known Allergies  Past Medical History  Diagnosis Date  . Hyperlipidemia   . HTN (hypertension)   . Colon polyps     adenomatous  . S/P cervical polypectomy     post polypectomy bleed. type of polypectomy unspecified.  . Bicuspid aortic valve     echo (5/10) with bicuspid AoV (upon re-review), mild LV dilation, EF 50-55%, no aortic stenosis, mild AI, moderate ascending aortic dilation (4.8 cm). 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 Echo  (3/11) bicuspid aortic valve with no AS but mild AI, mild LVH, EF 60%.    . Ascending aortic aneurysm     4.8 cm on ech (5/10), 4.6 x 4.6 cm by MRA 6/10 4.6 cm aortic root and ascending aorta by MRA 9/10. MRA chest (3/11) 4.5 ascending aorta.   . OSA on CPAP   . 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 Ascending aortic aneurysm     No past surgical history on file.  Family History  Problem Relation Age of Onset  . Hypertension Mother     HTN on mothers side.   . Coronary artery disease Maternal Grandfather   . Colon cancer Neg Hx   . Prostate cancer Neg Hx     History   Social History  . Marital Status: Married    Spouse Name: N/A    Number of Children: N/A  . Years of Education: N/A   Occupational History  . Comptroller    Social History Main Topics  . Smoking status: Never Smoker   . Smokeless tobacco: Never Used  . Alcohol Use: No  . Drug Use: No  . Sexually Active: Not on file   Other Topics Concern  . Not  on file   Social History Narrative   Comptroller.    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 Can leave msg on answering machine.    Review of Systems  Constitutional: Negative for fatigue and unexpected weight change.       Wears seat belt  HENT: Positive for congestion and rhinorrhea. Negative for hearing loss, dental problem and tinnitus.        Regular with dentist  Eyes: Negative for visual disturbance.       No diplopia or unilateral vision loss  Respiratory: Negative for cough, chest tightness and shortness of breath.   Cardiovascular: Negative for chest pain, palpitations and leg swelling.  Gastrointestinal: Negative for nausea, vomiting, abdominal pain, constipation and blood in stool.  Genitourinary: Negative for enuresis and difficulty urinating.       Satisfied with the viagra  Musculoskeletal: Negative for back pain,  joint swelling and arthralgias.  Skin: Negative for rash.       Has rough spot to right of nose---been there for about 1 month  Allergic/Immunologic: Positive for environmental allergies. Negative for immunocompromised state.       Some mild spring allergy symptoms--- no meds  Neurological: Negative for dizziness, syncope, weakness, light-headedness, numbness and headaches.  Hematological: Negative for adenopathy. Does not bruise/bleed easily.  Psychiatric/Behavioral: Negative for sleep disturbance and dysphoric mood. The patient is not nervous/anxious.        Sleeps okay with CPAP  (13cm?)       Objective:   Physical Exam  Constitutional: He is oriented to person, place, and time. He appears well-developed and well-nourished. No distress.  HENT:  Head: Normocephalic and atraumatic.  Right Ear: External ear normal.  Left Ear: External ear normal.  Mouth/Throat: Oropharynx is clear and moist. No oropharyngeal exudate.  Eyes: Conjunctivae and EOM are normal. Pupils are equal, round, and reactive to light.  Neck: Normal range of motion. Neck supple. No thyromegaly present.  Cardiovascular: Normal rate, regular rhythm and intact distal pulses.  Exam reveals no gallop.   Murmur heard. Soft but coarse systolic murmur in aortic area  Pulmonary/Chest: Effort normal and breath sounds normal. No respiratory distress. He has no wheezes. He has no rales.  Abdominal: Soft. There is no tenderness.  Musculoskeletal: He exhibits no edema and no tenderness.  Lymphadenopathy:    He has no cervical adenopathy.  Neurological: He is alert and oriented to person, place, and time.  Skin: No rash noted. No erythema.  Slight flaky spot ~2-75mm to right of nose (he prefers to just watch this for now--- cryotherapy if worsens)  Psychiatric: He has a normal mood and affect. His behavior is normal.          Assessment & Plan:

## 2013-01-27 LAB — PSA: PSA: 1.26 ng/mL (ref <=4.00)

## 2013-01-29 ENCOUNTER — Ambulatory Visit (HOSPITAL_COMMUNITY): Payer: 59 | Attending: Cardiology | Admitting: Radiology

## 2013-01-29 DIAGNOSIS — I359 Nonrheumatic aortic valve disorder, unspecified: Secondary | ICD-10-CM | POA: Insufficient documentation

## 2013-01-29 DIAGNOSIS — I712 Thoracic aortic aneurysm, without rupture, unspecified: Secondary | ICD-10-CM | POA: Insufficient documentation

## 2013-01-29 NOTE — Progress Notes (Signed)
Echocardiogram performed.  

## 2013-01-30 ENCOUNTER — Encounter: Payer: Self-pay | Admitting: *Deleted

## 2013-02-01 ENCOUNTER — Telehealth: Payer: Self-pay | Admitting: Cardiology

## 2013-02-01 NOTE — Telephone Encounter (Signed)
New Problem:    Patient called in returning your call regarding his recent ECHO results.  Please call back.

## 2013-02-01 NOTE — Telephone Encounter (Signed)
Spoke with pt about recent echo results. 

## 2013-02-12 ENCOUNTER — Other Ambulatory Visit: Payer: Self-pay | Admitting: *Deleted

## 2013-02-12 MED ORDER — LISINOPRIL 10 MG PO TABS: ORAL_TABLET | ORAL | Status: DC

## 2013-03-19 ENCOUNTER — Other Ambulatory Visit: Payer: Self-pay | Admitting: Internal Medicine

## 2013-03-24 ENCOUNTER — Other Ambulatory Visit: Payer: Self-pay | Admitting: Internal Medicine

## 2013-03-26 NOTE — Telephone Encounter (Signed)
Okay #10 x 5 

## 2013-03-26 NOTE — Telephone Encounter (Signed)
rx sent to pharmacy by e-script  

## 2013-04-16 ENCOUNTER — Other Ambulatory Visit: Payer: Self-pay | Admitting: Internal Medicine

## 2013-05-02 ENCOUNTER — Ambulatory Visit: Payer: 59 | Admitting: Cardiology

## 2013-05-11 ENCOUNTER — Encounter: Payer: Self-pay | Admitting: Pulmonary Disease

## 2013-05-11 ENCOUNTER — Ambulatory Visit (INDEPENDENT_AMBULATORY_CARE_PROVIDER_SITE_OTHER): Payer: 59 | Admitting: Cardiology

## 2013-05-11 ENCOUNTER — Encounter: Payer: Self-pay | Admitting: Cardiology

## 2013-05-11 ENCOUNTER — Ambulatory Visit (INDEPENDENT_AMBULATORY_CARE_PROVIDER_SITE_OTHER): Payer: 59 | Admitting: Pulmonary Disease

## 2013-05-11 VITALS — BP 110/68 | HR 75 | Temp 99.0°F | Ht 72.0 in | Wt 257.8 lb

## 2013-05-11 VITALS — BP 120/80 | HR 68 | Ht 72.0 in | Wt 254.0 lb

## 2013-05-11 DIAGNOSIS — I359 Nonrheumatic aortic valve disorder, unspecified: Secondary | ICD-10-CM

## 2013-05-11 DIAGNOSIS — I10 Essential (primary) hypertension: Secondary | ICD-10-CM

## 2013-05-11 DIAGNOSIS — I712 Thoracic aortic aneurysm, without rupture: Secondary | ICD-10-CM

## 2013-05-11 DIAGNOSIS — G4733 Obstructive sleep apnea (adult) (pediatric): Secondary | ICD-10-CM

## 2013-05-11 DIAGNOSIS — E785 Hyperlipidemia, unspecified: Secondary | ICD-10-CM

## 2013-05-11 MED ORDER — LISINOPRIL 20 MG PO TABS: 20.0000 mg | ORAL_TABLET | Freq: Every day | ORAL | Status: DC

## 2013-05-11 NOTE — Progress Notes (Signed)
Patient ID: Timothy Bernard, male   DOB: [birth year], 57 y.o.   MRN: [id number] PCP: Dr. Alphonsus Sias  57 yo with bicuspid aortic valve disorder and dilated ascending aorta returns for evaluation.  Weight is down 5 lbs.  BP has been under good control at home.  No exertional chest pain or dyspnea.  He walks for about a mile at a time for exercise.  MRA of aorta was done in 3/14, with stable 4.5 cm dilation of the ascending aorta.  Most recent echo was in 6/14 with bicuspid aortic valve and mild AI, very mild AS.   Labs (9/10): LDL 134, HDL 24 Labs (12/10): LDL 73, HDL 32 Labs (3/11): K 4.3, creatinine 1.3 Labs (2/12): K 4.5, creatinine 1.3 Labs (3/13): K 4.4, creatinine 1.1 Labs (5/13): LDL 50, HDL 33 Labs (3/14): LDL 60, HDL 29, K 4.3, creatinine 1.1  ECG: NSR, left axis deviation.   Allergies:  No Known Drug Allergies  Past Medical History: 1. Hyperlipidemia 2. Hypertension 3. Colon Polyps--adenomatous  4. Post Polypectomy Bleed 5. Bicuspid aortic valve: Echo (5/10) with bicuspid AoV (upon re-review), mild LV dilation, EF 50-55%, no aortic stenosis, mild AI, moderate ascending aortic dilation (4.8 cm).  Bicuspid valve confirmed by MRI.  Echo (3/11) bicuspid aortic valve with no AS but mild AI, mild LVH, EF 60%.  Echo (6/12): Mild LV hypertrophy, EF 55-60%, mild AI, no AS, mild MR.  Echo (6/14) with EF 55-60%, bicuspid aortic valve, mild AI, very mild AS, moderate LVH.   6. Ascending aortic aneurysm: 4.8 cm on echo (5/10), 4.6 x 4.6 cm by MRA 6/10, 4.6 cm aortic root and ascending aorta by MRA 9/10.  MRA chest (3/11) 4.5 cm ascending aorta.  MRA chest (3/12): 4.6 cm ascending aorta. MRA chest (3/13): 4.5 cm ascending aortic aneurysm.  MRA chest 3/14 with 4.5 cm ascending aorta.  7. 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   Current Outpatient Prescriptions  Medication Sig Dispense Refill  . aspirin 81 MG tablet Take 81 mg by mouth daily.        . CRESTOR 5 MG tablet Take 1 tablet by mouth  daily  90 tablet  1  . Flaxseed, Linseed, (FLAX SEED OIL) 1000 MG CAPS Take by mouth daily.        [name] glucose blood (ONE TOUCH ULTRA TEST) test strip Use as directed to test blood sugar once daily dx: 250.00  100 each  3  . Lancets (ONETOUCH ULTRASOFT) lancets 1 each by Other route daily. Dx:250.00  100 each  11  . lisinopril (PRINIVIL,ZESTRIL) 10 MG tablet Take 10 mg by mouth daily.      [name] lisinopril (PRINIVIL,ZESTRIL) 20 MG tablet Take 1 tablet (20 mg total) by mouth daily.  90 tablet  3  . metFORMIN (GLUCOPHAGE) 1000 MG tablet TAKE 1 TABLET (1,000 MG TOTAL) BY MOUTH 2 (TWO) TIMES DAILY WITH A MEAL.  60 tablet  11  . metoprolol succinate (TOPROL-XL) 50 MG 24 hr tablet TAKE 1 TABLET DAILY  90 tablet  3  . VIAGRA 100 MG tablet TAKE 1/2 TABLETS BY MOUTH DAILY AS NEEDED  10 tablet  5   No current facility-administered medications for this visit.    BP 120/80  Pulse 68  Ht 6' (1.829 m)  Wt 115.214 kg (254 lb)  BMI 34.44 kg/m2 General:  Well developed, well nourished, in no acute distress.  Obese.  Neck:  Neck supple, no JVD. No masses, thyromegaly or abnormal cervical nodes. Lungs:  Clear bilaterally to auscultation and percussion. Heart:  Non-displaced PMI, chest non-tender; regular rate and rhythm, S1, S2 without rubs or gallops. 2/6 early systolic ejection murmur RUSB.  Carotid upstroke normal, no bruit. Pedals normal pulses. No edema.  Abdomen:  Bowel sounds positive; abdomen soft and non-tender without masses, organomegaly, or hernias noted. No hepatosplenomegaly. Extremities:  No clubbing or cyanosis. Neurologic:  Alert and oriented x 3. Psych:  Normal  affect.  Assessment/Plan: 1. Bicuspid aortic valve: Mild AI, mild AS on last echo.  Repeat echo 6/15.  2. Ascending aortic aneurysm: 4.5 cm in 3/14, stable.  Related to bicuspid aortic valve.  Needs good BP control.  Will get MRA chest in 6/15 for followup.  Continue Toprol XL and lisinopril.  3. HTN: BP is well-controlled on current regimen.  4. Hyperlipidemia: Good lipids in 3/14.   Marca Ancona 05/11/2013 8:59 AM

## 2013-05-11 NOTE — Assessment & Plan Note (Signed)
The patient is doing extremely well on his current CPAP.  He feels that he sleeps well, and is satisfied with his daytime alertness.  His download shows excellent compliance, and no breakthrough apnea of significance.  I've asked him to continue on his CPAP, keep up with his mask changes and supplies, and continue working on weight loss.

## 2013-05-11 NOTE — Progress Notes (Signed)
  Subjective:    Patient ID: Timothy Bernard, male    DOB: [birth year], 57 y.o.   MRN: [id number]  HPI She comes in today for followup of his obstructive sleep apnea.  He is wearing CPAP compliantly, and feels that he is sleeping well with the device.  He is satisfied with his daytime alertness.  His download today shows excellent compliance, and good control of his sleep apnea.   Review of Systems  Constitutional: Negative for fever and unexpected weight change.  HENT: Negative for ear pain, nosebleeds, congestion, sore throat, rhinorrhea, sneezing, trouble swallowing, dental problem, postnasal drip and sinus pressure.   Eyes: Negative for redness and itching.  Respiratory: Negative for cough, chest tightness, shortness of breath and wheezing.   Cardiovascular: Negative for palpitations and leg swelling.  Gastrointestinal: Negative for nausea and vomiting.  Genitourinary: Negative for dysuria.  Musculoskeletal: Negative for joint swelling.  Skin: Negative for rash.  Neurological: Negative for headaches.  Hematological: Does not bruise/bleed easily.  Psychiatric/Behavioral: Negative for dysphoric mood. The patient is not nervous/anxious.        Objective:   Physical Exam Overweight male in no acute distress Nose without purulent discharge noted No skin breakdown or pressure necrosis from the CPAP as Neck without lymphadenopathy or thyromegaly Lower extremities without edema, cyanosis Alert and oriented, moves all 4 extremities.       Assessment & Plan:

## 2013-05-11 NOTE — Patient Instructions (Addendum)
Continue with cpap Keep up with mask changes and supplies. Work on weight reduction. followup with me in one year if doing well.

## 2013-05-11 NOTE — Patient Instructions (Signed)
Your physician has requested that you have an echocardiogram. Echocardiography is a painless test that uses sound waves to create images of your heart. It provides your doctor with information about the size and shape of your heart and how well your heart's chambers and valves are working. This procedure takes approximately one hour. There are no restrictions for this procedure. June 2015   Schedule an appointment for an MRA of your chest in June 2015 to follow up on the thoracic aorta aneurysm.  Your physician wants you to follow-up in: June 2015 with Dr Shirlee Latch a few days after you have echo and MRA.  You will receive a reminder letter in the mail two months in advance. If you don't receive a letter, please call our office to schedule the follow-up appointment.

## 2013-07-30 LAB — HM DIABETES EYE EXAM

## 2013-08-03 ENCOUNTER — Encounter: Payer: Self-pay | Admitting: Internal Medicine

## 2013-08-03 ENCOUNTER — Ambulatory Visit: Payer: 59 | Admitting: Internal Medicine

## 2013-08-03 ENCOUNTER — Ambulatory Visit (INDEPENDENT_AMBULATORY_CARE_PROVIDER_SITE_OTHER): Payer: 59 | Admitting: Internal Medicine

## 2013-08-03 VITALS — BP 122/80 | HR 62 | Temp 98.4°F | Wt 253.2 lb

## 2013-08-03 DIAGNOSIS — E785 Hyperlipidemia, unspecified: Secondary | ICD-10-CM

## 2013-08-03 DIAGNOSIS — I10 Essential (primary) hypertension: Secondary | ICD-10-CM

## 2013-08-03 DIAGNOSIS — E119 Type 2 diabetes mellitus without complications: Secondary | ICD-10-CM

## 2013-08-03 DIAGNOSIS — Z23 Encounter for immunization: Secondary | ICD-10-CM

## 2013-08-03 LAB — HM DIABETES FOOT EXAM

## 2013-08-03 LAB — HEMOGLOBIN A1C: Hgb A1c MFr Bld: 7.9 % — ABNORMAL HIGH (ref 4.6–6.5)

## 2013-08-03 NOTE — Patient Instructions (Signed)

## 2013-08-03 NOTE — Assessment & Plan Note (Signed)
BP Readings from Last 3 Encounters:  08/03/13 122/80  05/11/13 110/68  05/11/13 120/80   Good control

## 2013-08-03 NOTE — Progress Notes (Signed)
Subjective:    Patient ID: Timothy Bernard, male    DOB: 1955-10-30, 57 y.o.   MRN: [id number]  HPI Here for follow up  Checks sugars every other day Usually 120---high as much as 142 No hypoglycemic reactions No sores, numbess or foot pain Has eye appt next month  No chest pain No SOB Walks regularly--- tries to go 2 miles No edema Recent cardiology eval---stable  No stomach problems No myalgias but just occasional cramps No problems with statin  Current Outpatient Prescriptions on File Prior to Visit  Medication Sig Dispense Refill  . aspirin 81 MG tablet Take 81 mg by mouth daily.        . CRESTOR 5 MG tablet Take 1 tablet by mouth  daily  90 tablet  1  . Flaxseed, Linseed, (FLAX SEED OIL) 1000 MG CAPS Take by mouth daily.        [name] glucose blood (ONE TOUCH ULTRA TEST) test strip Use as directed to test blood sugar once daily dx: 250.00  100 each  3  . Lancets (ONETOUCH ULTRASOFT) lancets 1 each by Other route daily. Dx:250.00  100 each  11  . lisinopril (PRINIVIL,ZESTRIL) 10 MG tablet Take 10 mg by mouth daily.      [name] lisinopril (PRINIVIL,ZESTRIL) 20 MG tablet Take 1 tablet (20 mg total) by mouth daily.  90 tablet  3  . metFORMIN (GLUCOPHAGE) 1000 MG tablet TAKE 1 TABLET (1,000 MG TOTAL) BY MOUTH 2 (TWO) TIMES DAILY WITH A MEAL.  60 tablet  11  . metoprolol succinate (TOPROL-XL) 50 MG 24 hr tablet TAKE 1 TABLET DAILY  90 tablet  3  . VIAGRA 100 MG tablet TAKE 1/2 TABLETS BY MOUTH DAILY AS NEEDED  10 tablet  5   No current facility-administered medications on file prior to visit.    No Known Allergies  Past Medical History  Diagnosis Date  . Hyperlipidemia   . HTN (hypertension)   . Colon polyps     adenomatous  . S/P cervical polypectomy     post polypectomy bleed. type of polypectomy unspecified.  . Bicuspid aortic valve     echo (5/10) with bicuspid AoV (upon re-review), mild LV dilation, EF 50-55%, no aortic stenosis, mild AI, moderate ascending aortic  dilation (4.8 cm). 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 Echo (3/11) bicuspid aortic valve with no AS but mild AI, mild LVH, EF 60%.    . Ascending aortic aneurysm     4.8 cm on ech (5/10), 4.6 x 4.6 cm by MRA 6/10 4.6 cm aortic root and ascending aorta by MRA 9/10. MRA chest (3/11) 4.5 ascending aorta.   . OSA on CPAP   . 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 Ascending aortic aneurysm     No past surgical history on file.  Family History  Problem Relation Age of Onset  . Hypertension Mother     HTN on mothers side.   . Coronary artery disease Maternal Grandfather   . Colon cancer Neg Hx   . Prostate cancer Neg Hx     History   Social History  . Marital Status: Married    Spouse Name: N/A    Number of Children: N/A  . Years of Education: N/A   Occupational History  . Comptroller    Social History Main Topics  . Smoking status: Never Smoker   . Smokeless tobacco: Never Used  . Alcohol Use: No  . Drug Use: No  . Sexual  Activity: Not on file   Other Topics Concern  . Not on file   Social History Narrative   Comptroller.    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 Can leave msg on answering machine.    Review of Systems Weight stable--discussed working on increased exercise  Sleeps okay    Objective:   Physical Exam  Constitutional: He appears well-developed and well-nourished. No distress.  Neck: Normal range of motion. Neck supple. No thyromegaly present.  Cardiovascular: Normal rate, regular rhythm, normal heart sounds and intact distal pulses.  Exam reveals no gallop.   No murmur heard. Pulmonary/Chest: Effort normal and breath sounds normal. No respiratory distress. He has no wheezes. He has no rales.  Musculoskeletal: He exhibits no edema and no tenderness.  Lymphadenopathy:    He has no cervical adenopathy.  Neurological:  Normal  sensation in feet  Skin: No rash noted.  No foot lesions  Psychiatric: He has a normal mood and affect. His behavior is normal.          Assessment & Plan:

## 2013-08-03 NOTE — Progress Notes (Signed)
Pre-visit discussion using our clinic review tool. No additional management support is needed unless otherwise documented below in the visit note.  

## 2013-08-03 NOTE — Assessment & Plan Note (Signed)
No problems on the statin Labs next time

## 2013-08-03 NOTE — Addendum Note (Signed)
Addended by: Criselda Peaches B on: 08/03/2013 11:48 AM   Modules accepted: Orders

## 2013-08-03 NOTE — Assessment & Plan Note (Signed)
Still seems to have good control Discussed lifestyle

## 2013-08-07 ENCOUNTER — Encounter: Payer: Self-pay | Admitting: *Deleted

## 2013-08-21 ENCOUNTER — Other Ambulatory Visit: Payer: Self-pay | Admitting: Cardiology

## 2013-09-09 ENCOUNTER — Other Ambulatory Visit: Payer: Self-pay | Admitting: Cardiology

## 2013-10-10 ENCOUNTER — Other Ambulatory Visit: Payer: Self-pay | Admitting: Cardiology

## 2013-11-07 ENCOUNTER — Other Ambulatory Visit (HOSPITAL_COMMUNITY): Payer: 59

## 2013-11-10 ENCOUNTER — Other Ambulatory Visit: Payer: Self-pay | Admitting: Cardiology

## 2013-11-15 ENCOUNTER — Ambulatory Visit: Payer: 59 | Admitting: Cardiology

## 2013-12-31 ENCOUNTER — Telehealth: Payer: Self-pay | Admitting: Cardiology

## 2013-12-31 DIAGNOSIS — I712 Thoracic aortic aneurysm, without rupture, unspecified: Secondary | ICD-10-CM

## 2013-12-31 DIAGNOSIS — I359 Nonrheumatic aortic valve disorder, unspecified: Secondary | ICD-10-CM

## 2013-12-31 NOTE — Telephone Encounter (Signed)
New Message  Pt wife request discuss the last office visit and possible ECHo. Please call back to discuss

## 2013-12-31 NOTE — Telephone Encounter (Signed)
Pt's wife calling to schedule MRA chest and echo for June. She is aware I will forward to [facility] to schedule.

## 2014-01-08 ENCOUNTER — Ambulatory Visit: Payer: 59 | Admitting: Internal Medicine

## 2014-01-09 ENCOUNTER — Ambulatory Visit (INDEPENDENT_AMBULATORY_CARE_PROVIDER_SITE_OTHER): Payer: 59 | Admitting: Internal Medicine

## 2014-01-09 ENCOUNTER — Encounter: Payer: Self-pay | Admitting: Internal Medicine

## 2014-01-09 VITALS — BP 120/70 | HR 67 | Temp 98.2°F | Wt 251.0 lb

## 2014-01-09 DIAGNOSIS — J019 Acute sinusitis, unspecified: Secondary | ICD-10-CM

## 2014-01-09 MED ORDER — AMOXICILLIN 500 MG PO TABS: 1000.0000 mg | ORAL_TABLET | Freq: Two times a day (BID) | ORAL | Status: DC

## 2014-01-09 NOTE — Assessment & Plan Note (Signed)
No prior pollen sensitivity so probably low level sinus infection Empiric antibiotic reasonable after 7 weeks of symptoms Will try amoxil

## 2014-01-09 NOTE — Progress Notes (Signed)
Subjective:    Patient ID: Timothy Bernard, male    DOB: 1956-02-21, 58 y.o.   MRN: [id number]  HPI Has had congestion for a while Some sore throat Cough--brings up a little phlegm. Day and night Does have post nasal drip Low grade fever and chills Started about 7 weeks ago Feels like "initial symptoms of a cold"---may be some better than worsens again  Never had spring allergy problems before Tried OTC allergy and cold pills--didn't seem to help  No ear pain No SOB Has had rhinorrhea also  Current Outpatient Prescriptions on File Prior to Visit  Medication Sig Dispense Refill  . aspirin 81 MG tablet Take 81 mg by mouth daily.        . CRESTOR 5 MG tablet Take 1 tablet by mouth   daily  90 tablet  2  . Flaxseed, Linseed, (FLAX SEED OIL) 1000 MG CAPS Take by mouth daily.        [name] glucose blood (ONE TOUCH ULTRA TEST) test strip Use as directed to test blood sugar once daily dx: 250.00  100 each  3  . Lancets (ONETOUCH ULTRASOFT) lancets 1 each by Other route daily. Dx:250.00  100 each  11  . lisinopril (PRINIVIL,ZESTRIL) 20 MG tablet TAKE 1 TABLET BY MOUTH DAILY  90 tablet  1  . metFORMIN (GLUCOPHAGE) 1000 MG tablet TAKE 1 TABLET (1,000 MG TOTAL) BY MOUTH 2 (TWO) TIMES DAILY WITH A MEAL.  60 tablet  11  . metoprolol succinate (TOPROL-XL) 50 MG 24 hr tablet Take 1 tablet daily  90 tablet  2  . VIAGRA 100 MG tablet TAKE 1/2 TABLETS BY MOUTH DAILY AS NEEDED  10 tablet  5   No current facility-administered medications on file prior to visit.    No Known Allergies  Past Medical History  Diagnosis Date  . Hyperlipidemia   . HTN (hypertension)   . Colon polyps     adenomatous  . S/P cervical polypectomy     post polypectomy bleed. type of polypectomy unspecified.  . Bicuspid aortic valve     echo (5/10) with bicuspid AoV (upon re-review), mild LV dilation, EF 50-55%, no aortic stenosis, mild AI, moderate ascending aortic dilation (4.8 cm). 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 Echo  (3/11) bicuspid aortic valve with no AS but mild AI, mild LVH, EF 60%.    . Ascending aortic aneurysm     4.8 cm on ech (5/10), 4.6 x 4.6 cm by MRA 6/10 4.6 cm aortic root and ascending aorta by MRA 9/10. MRA chest (3/11) 4.5 ascending aorta.   . OSA on CPAP   . 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 Ascending aortic aneurysm     No past surgical history on file.  Family History  Problem Relation Age of Onset  . Hypertension Mother     HTN on mothers side.   . Coronary artery disease Maternal Grandfather   . Colon cancer Neg Hx   . Prostate cancer Neg Hx     History   Social History  . Marital Status: Married    Spouse Name: N/A    Number of Children: N/A  . 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 Smoking status: Never Smoker   . Smokeless tobacco: Never Used  . Alcohol Use: No  . Drug Use: No  . Sexual Activity: Not on file   Other Topics Concern  .  Not on file   Social History Narrative   Land.    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 Can leave msg on answering machine.    Review of Systems No rash No vomiting  Loose stools several times a week-- relates to metformin Appetite is okay     Objective:   Physical Exam  Constitutional: He appears well-developed and well-nourished. No distress.  HENT:  Mouth/Throat: Oropharynx is clear and moist. No oropharyngeal exudate.  No sinus tenderness Mild nasal inflammation TMs normal  Neck: Normal range of motion. Neck supple.  ?mild anterior cervical nodes--non tender  Pulmonary/Chest: Effort normal and breath sounds normal. No respiratory distress. He has no wheezes. He has no rales.          Assessment & Plan:

## 2014-01-09 NOTE — Progress Notes (Signed)
Pre visit review using our clinic review tool, if applicable. No additional management support is needed unless otherwise documented below in the visit note. 

## 2014-02-05 ENCOUNTER — Other Ambulatory Visit (INDEPENDENT_AMBULATORY_CARE_PROVIDER_SITE_OTHER): Payer: 59

## 2014-02-05 DIAGNOSIS — I712 Thoracic aortic aneurysm, without rupture, unspecified: Secondary | ICD-10-CM

## 2014-02-05 DIAGNOSIS — I359 Nonrheumatic aortic valve disorder, unspecified: Secondary | ICD-10-CM

## 2014-02-05 LAB — BASIC METABOLIC PANEL
BUN: 16 mg/dL (ref 6–23)
CALCIUM: 9.3 mg/dL (ref 8.4–10.5)
CO2: 24 meq/L (ref 19–32)
Chloride: 102 mEq/L (ref 96–112)
Creatinine, Ser: 1.2 mg/dL (ref 0.4–1.5)
GFR: 69.41 mL/min (ref >60.00)
Glucose, Bld: 159 mg/dL — ABNORMAL HIGH (ref 70–99)
Potassium: 4.4 mEq/L (ref 3.5–5.1)
Sodium: 135 mEq/L (ref 135–145)

## 2014-02-12 ENCOUNTER — Other Ambulatory Visit (HOSPITAL_COMMUNITY): Payer: 59

## 2014-02-12 ENCOUNTER — Other Ambulatory Visit: Payer: 59

## 2014-02-12 ENCOUNTER — Ambulatory Visit (HOSPITAL_COMMUNITY)
Admission: RE | Admit: 2014-02-12 | Discharge: 2014-02-12 | Disposition: A | Discharge status: Home / Self Care | Payer: 59 | Source: Ambulatory Visit | Attending: Cardiology | Admitting: Cardiology

## 2014-02-12 ENCOUNTER — Ambulatory Visit (HOSPITAL_BASED_OUTPATIENT_CLINIC_OR_DEPARTMENT_OTHER): Payer: 59 | Admitting: *Deleted

## 2014-02-12 DIAGNOSIS — I359 Nonrheumatic aortic valve disorder, unspecified: Secondary | ICD-10-CM

## 2014-02-12 DIAGNOSIS — I712 Thoracic aortic aneurysm, without rupture, unspecified: Secondary | ICD-10-CM | POA: Insufficient documentation

## 2014-02-12 MED ORDER — GADOBENATE DIMEGLUMINE 529 MG/ML IV SOLN
20.0000 mL | Freq: Once | INTRAVENOUS | Status: AC
  Administered 2014-02-12: 20 mL via INTRAVENOUS

## 2014-02-12 NOTE — Progress Notes (Signed)
Echo Complete

## 2014-02-19 ENCOUNTER — Ambulatory Visit (INDEPENDENT_AMBULATORY_CARE_PROVIDER_SITE_OTHER): Payer: 59 | Admitting: Internal Medicine

## 2014-02-19 ENCOUNTER — Encounter: Payer: Self-pay | Admitting: Internal Medicine

## 2014-02-19 VITALS — BP 120/80 | HR 66 | Temp 98.1°F | Ht 72.0 in | Wt 250.0 lb

## 2014-02-19 DIAGNOSIS — D369 Benign neoplasm, unspecified site: Secondary | ICD-10-CM

## 2014-02-19 DIAGNOSIS — I10 Essential (primary) hypertension: Secondary | ICD-10-CM

## 2014-02-19 DIAGNOSIS — I712 Thoracic aortic aneurysm, without rupture, unspecified: Secondary | ICD-10-CM

## 2014-02-19 DIAGNOSIS — Z23 Encounter for immunization: Secondary | ICD-10-CM

## 2014-02-19 DIAGNOSIS — E119 Type 2 diabetes mellitus without complications: Secondary | ICD-10-CM

## 2014-02-19 DIAGNOSIS — Z0000 Encounter for general adult medical examination without abnormal findings: Secondary | ICD-10-CM

## 2014-02-19 DIAGNOSIS — E785 Hyperlipidemia, unspecified: Secondary | ICD-10-CM

## 2014-02-19 LAB — LIPID PANEL
CHOLESTEROL: 117 mg/dL (ref 0–200)
HDL: 33.1 mg/dL — ABNORMAL LOW (ref >39.00)
LDL CALC: 47 mg/dL (ref 0–99)
NonHDL: 83.9
Total CHOL/HDL Ratio: 4
Triglycerides: 185 mg/dL — ABNORMAL HIGH (ref 0.0–149.0)
VLDL: 37 mg/dL (ref 0.0–40.0)

## 2014-02-19 LAB — CBC WITH DIFFERENTIAL/PLATELET
Basophils Absolute: 0 10*3/uL (ref 0.0–0.1)
Basophils Relative: 0.3 % (ref 0.0–3.0)
EOS ABS: 0.1 10*3/uL (ref 0.0–0.7)
Eosinophils Relative: 1.5 % (ref 0.0–5.0)
HCT: 42.7 % (ref 39.0–52.0)
Hemoglobin: 14 g/dL (ref 13.0–17.0)
LYMPHS ABS: 3.3 10*3/uL (ref 0.7–4.0)
Lymphocytes Relative: 35.6 % (ref 12.0–46.0)
MCHC: 32.8 g/dL (ref 30.0–36.0)
MCV: 92.1 fl (ref 78.0–100.0)
Monocytes Absolute: 0.6 10*3/uL (ref 0.1–1.0)
Monocytes Relative: 6.9 % (ref 3.0–12.0)
NEUTROS PCT: 55.7 % (ref 43.0–77.0)
Neutro Abs: 5.2 10*3/uL (ref 1.4–7.7)
Platelets: 278 10*3/uL (ref 150.0–400.0)
RBC: 4.63 Mil/uL (ref 4.22–5.81)
RDW: 14.2 % (ref 11.5–15.5)
WBC: 9.3 10*3/uL (ref 4.0–10.5)

## 2014-02-19 LAB — COMPREHENSIVE METABOLIC PANEL
ALBUMIN: 3.8 g/dL (ref 3.5–5.2)
ALT: 26 U/L (ref 0–53)
AST: 26 U/L (ref 0–37)
Alkaline Phosphatase: 54 U/L (ref 39–117)
BUN: 14 mg/dL (ref 6–23)
CO2: 26 mEq/L (ref 19–32)
Calcium: 9.2 mg/dL (ref 8.4–10.5)
Chloride: 106 mEq/L (ref 96–112)
Creatinine, Ser: 1.2 mg/dL (ref 0.4–1.5)
GFR: 64.21 mL/min (ref >60.00)
GLUCOSE: 137 mg/dL — AB (ref 70–99)
POTASSIUM: 4.4 meq/L (ref 3.5–5.1)
SODIUM: 138 meq/L (ref 135–145)
Total Bilirubin: 0.6 mg/dL (ref 0.2–1.2)
Total Protein: 7 g/dL (ref 6.0–8.3)

## 2014-02-19 LAB — HEMOGLOBIN A1C: Hgb A1c MFr Bld: 8.4 % — ABNORMAL HIGH (ref 4.6–6.5)

## 2014-02-19 LAB — TSH: TSH: 0.54 u[IU]/mL (ref 0.35–4.50)

## 2014-02-19 LAB — HM DIABETES FOOT EXAM

## 2014-02-19 LAB — T4, FREE: FREE T4: 0.69 ng/dL (ref 0.60–1.60)

## 2014-02-19 NOTE — Assessment & Plan Note (Signed)
Tdap today Overdue for colonoscopy PSA next year

## 2014-02-19 NOTE — Assessment & Plan Note (Signed)
No problems with statin Labs today

## 2014-02-19 NOTE — Patient Instructions (Signed)
Diabetes and Exercise Exercising regularly is important. It is not just about losing weight. It has many health benefits, such as:  Improving your overall fitness, flexibility, and endurance.  Increasing your bone density.  Helping with weight control.  Decreasing your body fat.  Increasing your muscle strength.  Reducing stress and tension.  Improving your overall health. People with diabetes who exercise gain additional benefits because exercise:  Reduces appetite.  Improves the body's use of blood sugar (glucose).  Helps lower or control blood glucose.  Decreases blood pressure.  Helps control blood lipids (such as cholesterol and triglycerides).  Improves the body's use of the hormone insulin by:  Increasing the body's insulin sensitivity.  Reducing the body's insulin needs.  Decreases the risk for heart disease because exercising:  Lowers cholesterol and triglycerides levels.  Increases the levels of good cholesterol (such as high-density lipoproteins [HDL]) in the body.  Lowers blood glucose levels. YOUR ACTIVITY PLAN  Choose an activity that you enjoy and set realistic goals. Your health care provider or diabetes educator can help you make an activity plan that works for you. You can break activities into 2 or 3 sessions throughout the day. Doing so is as good as one long session. Exercise ideas include:  Taking the dog for a walk.  Taking the stairs instead of the elevator.  Dancing to your favorite song.  Doing your favorite exercise with a friend. RECOMMENDATIONS FOR EXERCISING WITH TYPE 1 OR TYPE 2 DIABETES   Check your blood glucose before exercising. If blood glucose levels are greater than 240 mg/dL, check for urine ketones. Do not exercise if ketones are present.  Avoid injecting insulin into areas of the body that are going to be exercised. For example, avoid injecting insulin into:  The arms when playing tennis.  The legs when  jogging.  Keep a record of:  Food intake before and after you exercise.  Expected peak times of insulin action.  Blood glucose levels before and after you exercise.  The type and amount of exercise you have done.  Review your records with your health care provider. Your health care provider will help you to develop guidelines for adjusting food intake and insulin amounts before and after exercising.  If you take insulin or oral hypoglycemic agents, watch for signs and symptoms of hypoglycemia. They include:  Dizziness.  Shaking.  Sweating.  Chills.  Confusion.  Drink plenty of water while you exercise to prevent dehydration or heat stroke. Body water is lost during exercise and must be replaced.  Talk to your health care provider before starting an exercise program to make sure it is safe for you. Remember, almost any type of activity is better than none. Document Released: 11/06/2003 Document Revised: 04/18/2013 Document Reviewed: 01/23/2013 [facility] Patient Information 2015 [place], [place]. This information is not intended to replace advice given to you by your health care provider. Make sure you discuss any questions you have with your health care provider. Diabetes Mellitus and Food It is important for you to manage your blood sugar (glucose) level. Your blood glucose level can be greatly affected by what you eat. Eating healthier foods in the appropriate amounts throughout the day at about the same time each day will help you control your blood glucose level. It can also help slow or prevent worsening of your diabetes mellitus. Healthy eating may even help you improve the level of your blood pressure and reach or maintain a healthy weight.  HOW CAN FOOD AFFECT  ME? Carbohydrates Carbohydrates affect your blood glucose level more than any other type of food. Your dietitian will help you determine how many carbohydrates to eat at each meal and teach you how to count  carbohydrates. Counting carbohydrates is important to keep your blood glucose at a healthy level, especially if you are using insulin or taking certain medicines for diabetes mellitus. Alcohol Alcohol can cause sudden decreases in blood glucose (hypoglycemia), especially if you use insulin or take certain medicines for diabetes mellitus. Hypoglycemia can be a life-threatening condition. Symptoms of hypoglycemia (sleepiness, dizziness, and disorientation) are similar to symptoms of having too much alcohol.  If your health care provider has given you approval to drink alcohol, do so in moderation and use the following guidelines:  Women should not have more than one drink per day, and men should not have more than two drinks per day. One drink is equal to:  12 oz of beer.  5 oz of wine.  1 oz of hard liquor.  Do not drink on an empty stomach.  Keep yourself hydrated. Have water, diet soda, or unsweetened iced tea.  Regular soda, juice, and other mixers might contain a lot of carbohydrates and should be counted. WHAT FOODS ARE NOT RECOMMENDED? As you make food choices, it is important to remember that all foods are not the same. Some foods have fewer nutrients per serving than other foods, even though they might have the same number of calories or carbohydrates. It is difficult to get your body what it needs when you eat foods with fewer nutrients. Examples of foods that you should avoid that are high in calories and carbohydrates but low in nutrients include:  Trans fats (most processed foods list trans fats on the Nutrition Facts label).  Regular soda.  Juice.  Candy.  Sweets, such as cake, pie, doughnuts, and cookies.  Fried foods. WHAT FOODS CAN I EAT? Have nutrient-rich foods, which will nourish your body and keep you healthy. The food you should eat also will depend on several factors, including:  The calories you need.  The medicines you take.  Your weight.  Your blood  glucose level.  Your blood pressure level.  Your cholesterol level. You also should eat a variety of foods, including:  Protein, such as meat, poultry, fish, tofu, nuts, and seeds (lean animal proteins are best).  Fruits.  Vegetables.  Dairy products, such as milk, cheese, and yogurt (low fat is best).  Breads, grains, pasta, cereal, rice, and beans.  Fats such as olive oil, trans fat-free margarine, canola oil, avocado, and olives. DOES EVERYONE WITH DIABETES MELLITUS HAVE THE SAME MEAL PLAN? Because every person with diabetes mellitus is different, there is not one meal plan that works for everyone. It is very important that you meet with a dietitian who will help you create a meal plan that is just right for you. Document Released: 05/13/2005 Document Revised: 08/21/2013 Document Reviewed: 07/13/2013 [facility] Patient Information 2015 [place], [place]. This information is not intended to replace advice given to you by your health care provider. Make sure you discuss any questions you have with your health care provider.

## 2014-02-19 NOTE — Addendum Note (Signed)
Addended by: Despina Hidden on: 02/19/2014 03:56 PM   Modules accepted: Orders

## 2014-02-19 NOTE — Assessment & Plan Note (Signed)
BP Readings from Last 3 Encounters:  02/19/14 120/80  01/09/14 120/70  08/03/13 122/80   Good control

## 2014-02-19 NOTE — Assessment & Plan Note (Signed)
Stable on recent angiogram

## 2014-02-19 NOTE — Progress Notes (Signed)
Pre visit review using our clinic review tool, if applicable. No additional management support is needed unless otherwise documented below in the visit note. 

## 2014-02-19 NOTE — Assessment & Plan Note (Signed)
Needs to work better on lifestyle Will give 3 month trial if over 8% before starting glipizide

## 2014-02-19 NOTE — Progress Notes (Signed)
Subjective:    Patient ID: Timothy Bernard, male    DOB: August 22, 1956, 58 y.o.   MRN: [id number]  HPI Here for physical No new concerns Reviewed preventative care  Recent MRA No progression of AAA  Checks sugars every other day--fasting Usually 140's now No hypoglycemic reactions No foot sores or pain  Current Outpatient Prescriptions on File Prior to Visit  Medication Sig Dispense Refill  . aspirin 81 MG tablet Take 81 mg by mouth daily.        . CRESTOR 5 MG tablet Take 1 tablet by mouth   daily  90 tablet  2  . Flaxseed, Linseed, (FLAX SEED OIL) 1000 MG CAPS Take by mouth daily.        [name] glucose blood (ONE TOUCH ULTRA TEST) test strip Use as directed to test blood sugar once daily dx: 250.00  100 each  3  . Lancets (ONETOUCH ULTRASOFT) lancets 1 each by Other route daily. Dx:250.00  100 each  11  . lisinopril (PRINIVIL,ZESTRIL) 20 MG tablet TAKE 1 TABLET BY MOUTH DAILY  90 tablet  1  . metFORMIN (GLUCOPHAGE) 1000 MG tablet TAKE 1 TABLET (1,000 MG TOTAL) BY MOUTH 2 (TWO) TIMES DAILY WITH A MEAL.  60 tablet  11  . metoprolol succinate (TOPROL-XL) 50 MG 24 hr tablet Take 1 tablet daily  90 tablet  2  . VIAGRA 100 MG tablet TAKE 1/2 TABLETS BY MOUTH DAILY AS NEEDED  10 tablet  5   No current facility-administered medications on file prior to visit.    No Known Allergies  Past Medical History  Diagnosis Date  . Hyperlipidemia   . HTN (hypertension)   . Colon polyps     adenomatous  . S/P cervical polypectomy     post polypectomy bleed. type of polypectomy unspecified.  . Bicuspid aortic valve     echo (5/10) with bicuspid AoV (upon re-review), mild LV dilation, EF 50-55%, no aortic stenosis, mild AI, moderate ascending aortic dilation (4.8 cm). 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 Echo (3/11) bicuspid aortic valve with no AS but mild AI, mild LVH, EF 60%.    . Ascending aortic aneurysm     4.8 cm on ech (5/10), 4.6 x 4.6 cm by MRA 6/10 4.6 cm aortic root and ascending aorta by  MRA 9/10. MRA chest (3/11) 4.5 ascending aorta.   . OSA on CPAP   . 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 Ascending aortic aneurysm     No past surgical history on file.  Family History  Problem Relation Age of Onset  . Hypertension Mother     HTN on mothers side.   . Coronary artery disease Maternal Grandfather   . Colon cancer Neg Hx   . Prostate cancer Neg Hx     History   Social History  . Marital Status: Married    Spouse Name: N/A    Number of Children: N/A  . 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 Smoking status: Never Smoker   . Smokeless tobacco: Never Used  . Alcohol Use: No  . Drug Use: No  . Sexual Activity: Not on file   Other Topics Concern  . Not on file   Social History Narrative   Land.    Married, 1 stepson       Pt signed designated party release form and gives Mahamadou Weltz, spouse [phone], access  to medical records. Can leave msg on answering machine.    Review of Systems  Constitutional: Negative for fatigue and unexpected weight change.       Wears seat belt  HENT: Negative for dental problem, hearing loss and tinnitus.        Regular with dentist  Eyes: Negative for visual disturbance.       No diplopia or unilateral vision loss  Respiratory: Negative for cough, chest tightness and shortness of breath.   Cardiovascular: Negative for chest pain, palpitations and leg swelling.  Gastrointestinal: Negative for nausea, vomiting, abdominal pain, constipation and blood in stool.       No heartburn  Endocrine: Negative for cold intolerance and heat intolerance.  Genitourinary: Negative for urgency, frequency and difficulty urinating.       No sexual problems  Musculoskeletal: Negative for arthralgias, back pain and joint swelling.  Skin: Negative for rash.       No suspicious lesions  Allergic/Immunologic: Negative for environmental allergies  and immunocompromised state.  Neurological: Negative for dizziness, syncope, weakness, light-headedness, numbness and headaches.  Hematological: Negative for adenopathy. Does not bruise/bleed easily.  Psychiatric/Behavioral: Negative for sleep disturbance and dysphoric mood. The patient is not nervous/anxious.        Objective:   Physical Exam  Constitutional: He is oriented to person, place, and time. He appears well-developed and well-nourished. No distress.  HENT:  Head: Normocephalic and atraumatic.  Right Ear: External ear normal.  Left Ear: External ear normal.  Mouth/Throat: Oropharynx is clear and moist. No oropharyngeal exudate.  Eyes: Conjunctivae and EOM are normal. Pupils are equal, round, and reactive to light.  Neck: Normal range of motion. Neck supple. No thyromegaly present.  Cardiovascular: Normal rate, regular rhythm and intact distal pulses.  Exam reveals no gallop.   Murmur heard. Soft but coarse aortic systolic murmur ?fixed S2  Pulmonary/Chest: Effort normal and breath sounds normal. No respiratory distress. He has no wheezes. He has no rales.  Abdominal: Soft. There is no tenderness.  Musculoskeletal: He exhibits no edema and no tenderness.  Lymphadenopathy:    He has no cervical adenopathy.  Neurological: He is alert and oriented to person, place, and time.  Normal sensation in feet  Skin: No rash noted. No erythema.  No foot lesions  Psychiatric: He has a normal mood and affect. His behavior is normal.          Assessment & Plan:

## 2014-02-20 ENCOUNTER — Telehealth: Payer: Self-pay | Admitting: Internal Medicine

## 2014-02-20 NOTE — Telephone Encounter (Signed)
Relevant patient education assigned to patient using Emmi. ° °

## 2014-02-21 ENCOUNTER — Other Ambulatory Visit: Payer: Self-pay | Admitting: *Deleted

## 2014-02-26 ENCOUNTER — Encounter: Payer: Self-pay | Admitting: Cardiology

## 2014-02-26 ENCOUNTER — Ambulatory Visit (INDEPENDENT_AMBULATORY_CARE_PROVIDER_SITE_OTHER): Payer: 59 | Admitting: Cardiology

## 2014-02-26 VITALS — BP 111/73 | HR 71 | Ht 72.0 in | Wt 249.0 lb

## 2014-02-26 DIAGNOSIS — I712 Thoracic aortic aneurysm, without rupture, unspecified: Secondary | ICD-10-CM

## 2014-02-26 DIAGNOSIS — I359 Nonrheumatic aortic valve disorder, unspecified: Secondary | ICD-10-CM

## 2014-02-26 NOTE — Patient Instructions (Signed)
Your physician wants you to follow-up in: 1 year with Dr Aundra Dubin. (June 2016). You will receive a reminder letter in the mail two months in advance. If you don't receive a letter, please call our office to schedule the follow-up appointment.   Schedule an appointment for a Cardiac MRI and chest MRA in 1 year a few days before you see Dr Aundra Dubin in June 2016.

## 2014-02-26 NOTE — Progress Notes (Signed)
Patient ID: Timothy Bernard, male   DOB: [birth year], 58 y.o.   MRN: [id number] PCP: Dr. Silvio Pate  58 yo with bicuspid aortic valve disorder and dilated ascending aorta returns for evaluation.  Weight is down 5 lbs.  BP has been under good control at home.  No exertional chest pain or dyspnea.  He walks for about a mile at a time for exercise.  MRA of aorta was done in 6/15, with stable 4.7 cm dilation of the ascending aorta.  Most recent echo was in 6/15 with bicuspid aortic valve and mild-moderate AI, mild AS.   Labs (9/10): LDL 134, HDL 24 Labs (12/10): LDL 73, HDL 32 Labs (3/11): K 4.3, creatinine 1.3 Labs (2/12): K 4.5, creatinine 1.3 Labs (3/13): K 4.4, creatinine 1.1 Labs (5/13): LDL 50, HDL 33 Labs (3/14): LDL 60, HDL 29, K 4.3, creatinine 1.1 Labs (6/15): LDL 47, HDL 33, K 4.4, creatinine 1.2  ECG: NSR, left axis deviation, LVH.   Allergies:  No Known Drug Allergies  Past Medical History: 1. Hyperlipidemia 2. Hypertension 3. Colon Polyps--adenomatous  4. Post Polypectomy Bleed 5. Bicuspid aortic valve: Echo (5/10) with bicuspid AoV (upon re-review), mild LV dilation, EF 50-55%, no aortic stenosis, mild AI, moderate ascending aortic dilation (4.8 cm).  Bicuspid valve confirmed by MRI.  Echo (3/11) bicuspid aortic valve with no AS but mild AI, mild LVH, EF 60%.  Echo (6/12): Mild LV hypertrophy, EF 55-60%, mild AI, no AS, mild MR.  Echo (6/14) with EF 55-60%, bicuspid aortic valve, mild AI, very mild AS, moderate LVH.  Echo (6/15) with 55-60%, moderate LVH, bicuspid aortic valve, mild to moderate AI, mild AS.  6. Ascending aortic aneurysm: 4.8 cm on echo (5/10), 4.6 x 4.6 cm by MRA 6/10, 4.6 cm aortic root and ascending aorta by MRA 9/10.  MRA chest (3/11) 4.5 cm ascending aorta.  MRA chest (3/12): 4.6 cm ascending aorta. MRA chest (3/13): 4.5 cm ascending aortic aneurysm.  MRA chest 3/14 with 4.5 cm ascending aorta.  MRA chest (6/15) with 4.7 cm ascending aortic aneurysm.  7. 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   Current Outpatient Prescriptions  Medication Sig Dispense Refill  . aspirin 81 MG tablet Take 81 mg by mouth daily.        . CRESTOR 5 MG tablet Take 1 tablet by mouth   daily  90 tablet  2  . Flaxseed, Linseed, (FLAX SEED OIL) 1000 MG CAPS Take by mouth daily.        [name] glucose blood (ONE TOUCH ULTRA TEST) test strip Use as directed to test blood sugar once daily dx: 250.00  100 each  3  . Lancets (ONETOUCH ULTRASOFT) lancets 1 each by Other route daily. Dx:250.00  100 each  11  . lisinopril (PRINIVIL,ZESTRIL) 10 MG tablet Take 10 mg by mouth daily.       [name] lisinopril (PRINIVIL,ZESTRIL) 20 MG tablet TAKE 1 TABLET BY MOUTH DAILY  90 tablet  1  . metFORMIN (GLUCOPHAGE) 1000 MG tablet TAKE 1 TABLET (1,000 MG TOTAL) BY MOUTH 2 (TWO) TIMES DAILY WITH A MEAL.  60 tablet  11  . metoprolol  succinate (TOPROL-XL) 50 MG 24 hr tablet Take 1 tablet daily  90 tablet  2  . VIAGRA 100 MG tablet TAKE 1/2 TABLETS BY MOUTH DAILY AS NEEDED  10 tablet  5   No current facility-administered medications for this visit.    BP 111/73  Pulse 71  Ht 6' (1.829 m)  Wt 249 lb (112.946 kg)  BMI 33.76 kg/m2 General:  Well developed, well nourished, in no acute distress.  Obese.  Neck:  Neck supple, no JVD. No masses, thyromegaly or abnormal cervical nodes. Lungs:  Clear bilaterally to auscultation and percussion. Heart:  Non-displaced PMI, chest non-tender; regular rate and rhythm, S1, S2 without rubs or gallops. 1/6 early systolic ejection murmur RUSB.  Carotid upstroke normal, no bruit. Pedals normal pulses. No edema.  Abdomen:  Bowel sounds positive; abdomen  soft and non-tender without masses, organomegaly, or hernias noted. No hepatosplenomegaly. Extremities:  No clubbing or cyanosis. Neurologic:  Alert and oriented x 3. Psych:  Normal affect.  Assessment/Plan: 1. Bicuspid aortic valve: Mild-moderate AI, mild AS on last echo.  I will have him get a cardiac MRI to reassess the valve in 6/16.  2. Ascending aortic aneurysm: 4.7 cm in 6/15, stable.  Related to bicuspid aortic valve.  Needs good BP control.  Will get MRA chest in 6/16 for followup (in association with cardiac MRI).  Continue Toprol XL and lisinopril.  3. HTN: BP is well-controlled on current regimen.  4. Hyperlipidemia: Good lipids in 6/15.  5. I would like to see him exercising more, recommended walking 4-5 times/week.   Loralie Champagne 02/26/2014

## 2014-02-27 ENCOUNTER — Telehealth: Payer: Self-pay | Admitting: Internal Medicine

## 2014-02-27 NOTE — Telephone Encounter (Signed)
Left message for pt to call back  °

## 2014-02-27 NOTE — Telephone Encounter (Signed)
Patient Information:  Caller Name: Blanch Media  Phone: [phone]  Patient: Timothy Bernard, Timothy Bernard  Gender: Male  DOB: 03/30/1956  Age: 58 Years  PCP: Viviana Simpler [facility])  Office Follow Up:  Does the office need to follow up with this patient?: No  Instructions For The Office: N/A   Symptoms  Reason For Call & Symptoms: Wife calling requesting an antibiotic for pt for ongoing cough and sinus infection. Not with pt. Offer to call pt and triage, declined. Will take an appt after discussion on f/u assessment for sinus infection.  Reviewed Health History In EMR: Yes  Reviewed Medications In EMR: Yes  Reviewed Allergies In EMR: Yes  Reviewed Surgeries / Procedures: Yes  Date of Onset of Symptoms: 01/09/2014  Treatments Tried: antibiotic 01/09/14  Treatments Tried Worked: No  Guideline(s) Used:  No Protocol Available - Sick Adult  Disposition Per Guideline:   See Today or Tomorrow in Office  Reason For Disposition Reached:   Nursing judgment  Advice Given:  N/A  Patient Will Follow Care Advice:  YES  Appointment Scheduled:  02/28/2014 16:15:00 Appointment Scheduled Provider:  Viviana Simpler [facility])

## 2014-02-28 ENCOUNTER — Other Ambulatory Visit: Payer: Self-pay | Admitting: Cardiology

## 2014-02-28 ENCOUNTER — Encounter: Payer: Self-pay | Admitting: Internal Medicine

## 2014-02-28 ENCOUNTER — Ambulatory Visit (INDEPENDENT_AMBULATORY_CARE_PROVIDER_SITE_OTHER): Payer: 59 | Admitting: Internal Medicine

## 2014-02-28 VITALS — BP 120/70 | HR 82 | Temp 98.1°F | Wt 249.0 lb

## 2014-02-28 DIAGNOSIS — J018 Other acute sinusitis: Secondary | ICD-10-CM

## 2014-02-28 DIAGNOSIS — J019 Acute sinusitis, unspecified: Secondary | ICD-10-CM | POA: Insufficient documentation

## 2014-02-28 MED ORDER — AMOXICILLIN 500 MG PO TABS: 1000.0000 mg | ORAL_TABLET | Freq: Two times a day (BID) | ORAL | Status: DC

## 2014-02-28 NOTE — Progress Notes (Signed)
Pre visit review using our clinic review tool, if applicable. No additional management support is needed unless otherwise documented below in the visit note. 

## 2014-02-28 NOTE — Progress Notes (Signed)
Subjective:    Patient ID: Timothy Bernard, male    DOB: 1955-09-01, 58 y.o.   MRN: [id number]  HPI Has started with sinus symptoms after his visit here-- like 2 weeks ago Lots of nasal congestion--especially bad at night PND with night cough AM sputum-- some color now  No headache Slight sore throat No ear pain No fever No SOB  Doesn't feel like he is any better No meds for this  Current Outpatient Prescriptions on File Prior to Visit  Medication Sig Dispense Refill  . aspirin 81 MG tablet Take 81 mg by mouth daily.        . CRESTOR 5 MG tablet Take 1 tablet by mouth   daily  90 tablet  2  . Flaxseed, Linseed, (FLAX SEED OIL) 1000 MG CAPS Take by mouth daily.        [name] glucose blood (ONE TOUCH ULTRA TEST) test strip Use as directed to test blood sugar once daily dx: 250.00  100 each  3  . Lancets (ONETOUCH ULTRASOFT) lancets 1 each by Other route daily. Dx:250.00  100 each  11  . lisinopril (PRINIVIL,ZESTRIL) 10 MG tablet Take 10 mg by mouth daily.       [name] lisinopril (PRINIVIL,ZESTRIL) 20 MG tablet TAKE 1 TABLET BY MOUTH DAILY  90 tablet  1  . metFORMIN (GLUCOPHAGE) 1000 MG tablet TAKE 1 TABLET (1,000 MG TOTAL) BY MOUTH 2 (TWO) TIMES DAILY WITH A MEAL.  60 tablet  11  . VIAGRA 100 MG tablet TAKE 1/2 TABLETS BY MOUTH DAILY AS NEEDED  10 tablet  5   No current facility-administered medications on file prior to visit.    No Known Allergies  Past Medical History  Diagnosis Date  . Hyperlipidemia   . HTN (hypertension)   . Colon polyps     adenomatous  . S/P cervical polypectomy     post polypectomy bleed. type of polypectomy unspecified.  . Bicuspid aortic valve     echo (5/10) with bicuspid AoV (upon re-review), mild LV dilation, EF 50-55%, no aortic stenosis, mild AI, moderate ascending aortic dilation (4.8 cm). 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 Echo (3/11) bicuspid aortic valve with no AS but mild AI, mild LVH, EF 60%.    . Ascending aortic aneurysm     4.8 cm on ech  (5/10), 4.6 x 4.6 cm by MRA 6/10 4.6 cm aortic root and ascending aorta by MRA 9/10. MRA chest (3/11) 4.5 ascending aorta.   . OSA on CPAP   . 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 Ascending aortic aneurysm     No past surgical history on file.  Family History  Problem Relation Age of Onset  . Hypertension Mother     HTN on mothers side.   . Coronary artery disease Maternal Grandfather   . Colon cancer Neg Hx   . Prostate cancer Neg Hx     History   Social History  . Marital Status: Married    Spouse Name: N/A    Number of Children: N/A  . 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 Smoking status: Never Smoker   . Smokeless tobacco: Never Used  . Alcohol Use: No  . Drug Use: No  . Sexual Activity: Not on file   Other Topics Concern  . Not on file   Social History Narrative   Land.    Married, 1  stepson       Pt signed designated party release form and gives [name], spouse [phone], access to medical records. Can leave msg on answering machine.    Review of Systems No rash No vomiting or diarrhea Appetite is still okay     Objective:   Physical Exam  Constitutional: He appears well-developed and well-nourished. No distress.  HENT:  Mouth/Throat: Oropharynx is clear and moist. No oropharyngeal exudate.  No sinus tenderness TMs normal Mild nasal inflammation  Neck: Normal range of motion. Neck supple. No thyromegaly present.  Pulmonary/Chest: Effort normal and breath sounds normal. No respiratory distress. He has no wheezes. He has no rales.  Lymphadenopathy:    He has no cervical adenopathy.          Assessment & Plan:

## 2014-02-28 NOTE — Assessment & Plan Note (Signed)
Sick for 2 weeks so likely secondary bacterial infection Discussed analgesics Will treat with amoxil

## 2014-02-28 NOTE — Addendum Note (Signed)
Addended by: Katrine Coho on: 02/28/2014 08:01 AM   Modules accepted: Orders

## 2014-02-28 NOTE — Telephone Encounter (Signed)
Dr. Deatra Ina this pt would like to switch care to Dr. Olevia Perches, states he saw Dr. Olevia Perches in the hospital and he found her very easy to talk to. Will you approve the switch? Please advise.

## 2014-03-02 NOTE — Telephone Encounter (Signed)
ok 

## 2014-03-04 NOTE — Telephone Encounter (Signed)
I will not be able to accept Mr candelaria because I will be retiring  At the end of 2016 so he would have to switch providers again.

## 2014-03-04 NOTE — Telephone Encounter (Signed)
OK 

## 2014-03-04 NOTE — Telephone Encounter (Signed)
Dr. Carlean Purl will you accept this pt? Wife states she is seen by you. Please advise.

## 2014-03-04 NOTE — Telephone Encounter (Signed)
Dr. Olevia Perches will you accept this pt? Please see note below.

## 2014-03-04 NOTE — Telephone Encounter (Signed)
Pt scheduled to see Dr. Carlean Purl 05/20/14@8 :30am. Pt aware of appt, may want to schedule colon at this visit.

## 2014-03-05 ENCOUNTER — Encounter: Payer: Self-pay | Admitting: *Deleted

## 2014-03-14 ENCOUNTER — Other Ambulatory Visit: Payer: Self-pay | Admitting: Internal Medicine

## 2014-04-12 ENCOUNTER — Other Ambulatory Visit: Payer: Self-pay | Admitting: Cardiology

## 2014-04-17 ENCOUNTER — Other Ambulatory Visit: Payer: Self-pay | Admitting: Cardiology

## 2014-04-23 ENCOUNTER — Other Ambulatory Visit: Payer: Self-pay | Admitting: Internal Medicine

## 2014-05-08 ENCOUNTER — Other Ambulatory Visit: Payer: 59

## 2014-05-17 ENCOUNTER — Ambulatory Visit: Payer: 59 | Admitting: Pulmonary Disease

## 2014-05-20 ENCOUNTER — Ambulatory Visit (INDEPENDENT_AMBULATORY_CARE_PROVIDER_SITE_OTHER): Payer: Self-pay | Admitting: Internal Medicine

## 2014-05-20 ENCOUNTER — Encounter: Payer: Self-pay | Admitting: Internal Medicine

## 2014-05-20 VITALS — BP 120/70 | HR 76 | Ht 71.0 in | Wt 252.2 lb

## 2014-05-20 DIAGNOSIS — Z860100 Personal history of colon polyps, unspecified: Secondary | ICD-10-CM | POA: Insufficient documentation

## 2014-05-20 NOTE — Progress Notes (Signed)
Patient ID: Timothy Bernard, male   DOB: [birth year], 58 y.o.   MRN: [id number]        The patient is here to discuss scheduling a routine surveillance colonoscopy. In 2009 had 2 adenomatous polyps removed. 10 and 12 mm. He has not had colonoscopy since. He had a post polypectomy burn syndrome. He is changing providers for his procedure.  He does not have any active GI complaints at this time.  Personal history of colonic polyp - adenomas It is appropriate to perform a surveillance colonoscopy in this man with 10 and 12 mm tubular adenomas removed in 2009.The risks and benefits as well as alternatives of endoscopic procedure(s) have been discussed and reviewed. All questions answered. The patient agrees to proceed. He was not charge for this routine visit which typically is a nursing only visit.     I appreciate the opportunity to care for this patient. CC: Viviana Simpler, MD

## 2014-05-20 NOTE — Assessment & Plan Note (Signed)
It is appropriate to perform a surveillance colonoscopy in this man with 10 and 12 mm tubular adenomas removed in 2009.The risks and benefits as well as alternatives of endoscopic procedure(s) have been discussed and reviewed. All questions answered. The patient agrees to proceed. He was not charge for this routine visit which typically is a nursing only visit.

## 2014-05-20 NOTE — Patient Instructions (Signed)

## 2014-05-23 ENCOUNTER — Other Ambulatory Visit: Payer: Self-pay

## 2014-05-23 MED ORDER — ONETOUCH ULTRASOFT LANCETS MISC: 1.0000 | Freq: Every day | Status: DC

## 2014-05-23 MED ORDER — GLUCOSE BLOOD VI STRP: ORAL_STRIP | Status: DC

## 2014-05-30 ENCOUNTER — Encounter: Payer: Self-pay | Admitting: Internal Medicine

## 2014-07-09 ENCOUNTER — Ambulatory Visit (AMBULATORY_SURGERY_CENTER): Payer: 59 | Admitting: Internal Medicine

## 2014-07-09 ENCOUNTER — Encounter: Payer: Self-pay | Admitting: Internal Medicine

## 2014-07-09 VITALS — BP 123/69 | HR 66 | Temp 98.0°F | Resp 19 | Ht 71.0 in | Wt 252.0 lb

## 2014-07-09 DIAGNOSIS — D124 Benign neoplasm of descending colon: Secondary | ICD-10-CM

## 2014-07-09 DIAGNOSIS — D123 Benign neoplasm of transverse colon: Secondary | ICD-10-CM

## 2014-07-09 DIAGNOSIS — D122 Benign neoplasm of ascending colon: Secondary | ICD-10-CM

## 2014-07-09 DIAGNOSIS — Z86010 Personal history of colonic polyps: Secondary | ICD-10-CM

## 2014-07-09 LAB — GLUCOSE, CAPILLARY
GLUCOSE-CAPILLARY: 134 mg/dL — AB (ref 70–99)
Glucose-Capillary: 116 mg/dL — ABNORMAL HIGH (ref 70–99)

## 2014-07-09 MED ORDER — SODIUM CHLORIDE 0.9 % IV SOLN: 500.0000 mL | INTRAVENOUS | Status: DC

## 2014-07-09 NOTE — Patient Instructions (Addendum)
I found and removed 6 polyps - they all look benign. Largest 10 mm. I will let you know pathology results and when to have another routine colonoscopy by mail.  I appreciate the opportunity to care for you. Gatha Mayer, MD, [facility]   Information on polyps given to you today  YOU HAD AN ENDOSCOPIC PROCEDURE TODAY AT [place]: Refer to the procedure report that was given to you for any specific questions about what was found during the examination.  If the procedure report does not answer your questions, please call your gastroenterologist to clarify.  If you requested that your care partner not be given the details of your procedure findings, then the procedure report has been included in a sealed envelope for you to review at your convenience later.  YOU SHOULD EXPECT: Some feelings of bloating in the abdomen. Passage of more gas than usual.  Walking can help get rid of the air that was put into your GI tract during the procedure and reduce the bloating. If you had a lower endoscopy (such as a colonoscopy or flexible sigmoidoscopy) you may notice spotting of blood in your stool or on the toilet paper. If you underwent a bowel prep for your procedure, then you may not have a normal bowel movement for a few days.  DIET: Your first meal following the procedure should be a light meal and then it is ok to progress to your normal diet.  A half-sandwich or bowl of soup is an example of a good first meal.  Heavy or fried foods are harder to digest and may make you feel nauseous or bloated.  Likewise meals heavy in dairy and vegetables can cause extra gas to form and this can also increase the bloating.  Drink plenty of fluids but you should avoid alcoholic beverages for 24 hours.  ACTIVITY: Your care partner should take you home directly after the procedure.  You should plan to take it easy, moving slowly for the rest of the day.  You can resume normal activity the day after the  procedure however you should NOT DRIVE or use heavy machinery for 24 hours (because of the sedation medicines used during the test).    SYMPTOMS TO REPORT IMMEDIATELY: A gastroenterologist can be reached at any hour.  During normal business hours, 8:30 AM to 5:00 PM Monday through Friday, call [phone].  After hours and on weekends, please call the GI answering service at [phone] who will take a message and have the physician on call contact you.   Following lower endoscopy (colonoscopy or flexible sigmoidoscopy):  Excessive amounts of blood in the stool  Significant tenderness or worsening of abdominal pains  Swelling of the abdomen that is new, acute  Fever of 100F or higher  FOLLOW UP: If any biopsies were taken you will be contacted by phone or by letter within the next 1-3 weeks.  Call your gastroenterologist if you have not heard about the biopsies in 3 weeks.  Our staff will call the home number listed on your records the next business day following your procedure to check on you and address any questions or concerns that you may have at that time regarding the information given to you following your procedure. This is a courtesy call and so if there is no answer at the home number and we have not heard from you through the emergency physician on call, we will assume that you have returned to your regular  daily activities without incident.  SIGNATURES/CONFIDENTIALITY: You and/or your care partner have signed paperwork which will be entered into your electronic medical record.  These signatures attest to the fact that that the information above on your After Visit Summary has been reviewed and is understood.  Full responsibility of the confidentiality of this discharge information lies with you and/or your care-partner.

## 2014-07-09 NOTE — Op Note (Signed)
[place]  [employer]. [place], [id number]   COLONOSCOPY PROCEDURE REPORT  PATIENT: Timothy Bernard, Timothy Bernard  MR#: [id number] BIRTHDATE: Oct 03, 1955 , 47  yrs. old GENDER: male ENDOSCOPIST: Gatha Mayer, MD, [facility] PROCEDURE DATE:  07/09/2014 PROCEDURE:   Colonoscopy with biopsy and Colonoscopy with snare polypectomy First Screening Colonoscopy - Avg.  risk and is 50 yrs.  old or older - No.  Prior Negative Screening - Now for repeat screening. N/A  History of Adenoma - Now for follow-up colonoscopy & has been > or = to 3 yrs.  Yes hx of adenoma.  Has been 3 or more years since last colonoscopy.  Polyps Removed Today? Yes. ASA CLASS:   Class III INDICATIONS:surveillance colonoscopy based on a history of adenomatous colonic polyp(s). MEDICATIONS: Propofol 270 mg IV and Monitored anesthesia care DESCRIPTION OF PROCEDURE:   After the risks benefits and alternatives of the procedure were thoroughly explained, informed consent was obtained.  The digital rectal exam revealed no abnormalities of the rectum.   The LB [id number]  endoscope was introduced through the anus and advanced to the cecum, which was identified by both the appendix and ileocecal valve. No adverse events experienced.   The quality of the prep was good, using MiraLax  The instrument was then slowly withdrawn as the colon was fully examined.  COLON FINDINGS: 1) 6 polyps removed: 10 mm ascending and 7 mm descending with cold snare, 1 dinminutive transverse and 2 diminutive descending removed with cold forceps.  Complete removal/retrieval of all. 2) Otherwise normal colonoscopy, right colon retroflexion included. Good prep.  Hx adenomas 2009 (max 1mm).  Retroflexed views revealed no abnormalities. The time to cecum=2 minutes 05 seconds. Withdrawal time=16 minutes 07 seconds.  The scope was withdrawn and the procedure completed. COMPLICATIONS: There were no immediate complications. ENDOSCOPIC  IMPRESSION: 1) 6 polyps removed: 10 mm ascending and 7 mm descending with cold snare, 1 dinminutive transverse and 2 diminutive descending removed with cold forceps.  Complete removal/retrieval of all. 2) Otherwise normal colonoscopyGood prep.  Hx adenomas 2009 (max 66mm)  RECOMMENDATIONS: Timing of repeat colonoscopy will be determined by pathology findings. eSigned:  Gatha Mayer, MD, [facility] 07/09/2014 2:38 PM   cc: The Patient

## 2014-07-09 NOTE — Progress Notes (Signed)
Report to PACU, RN, vss, BBS= Clear.  

## 2014-07-09 NOTE — Progress Notes (Signed)
Called to room to assist during endoscopic procedure.  Patient ID and intended procedure confirmed with present staff. Received instructions for my participation in the procedure from the performing physician.  

## 2014-07-10 ENCOUNTER — Telehealth: Payer: Self-pay | Admitting: *Deleted

## 2014-07-10 NOTE — Telephone Encounter (Signed)
  Follow up Call-  Call back number 07/09/2014  Post procedure Call Back phone  # [phone]  Permission to leave phone message Yes     Patient questions:  Do you have a fever, pain , or abdominal swelling? No. Pain Score  0 *  Have you tolerated food without any problems? Yes.    Have you been able to return to your normal activities? Yes.    Do you have any questions about your discharge instructions: Diet   No. Medications  No. Follow up visit  No.  Do you have questions or concerns about your Care? No.  Actions: * If pain score is 4 or above: No action needed, pain <4.  Spoke with spouse.  States Timothy Bernard has left for work.  States he did well post colonoscopy.  Encouraged her to have him call the office if he has any questions or concerns.

## 2014-07-15 ENCOUNTER — Encounter: Payer: Self-pay | Admitting: Internal Medicine

## 2014-07-15 NOTE — Progress Notes (Signed)
Quick Note:  6 adenomas max 10 mm Recall colonoscopy 06/2017 ______

## 2014-07-16 ENCOUNTER — Encounter: Payer: Self-pay | Admitting: Pulmonary Disease

## 2014-07-16 ENCOUNTER — Ambulatory Visit (INDEPENDENT_AMBULATORY_CARE_PROVIDER_SITE_OTHER): Payer: 59 | Admitting: Pulmonary Disease

## 2014-07-16 VITALS — BP 120/62 | HR 68 | Temp 98.1°F | Ht 72.0 in | Wt 259.4 lb

## 2014-07-16 DIAGNOSIS — G4733 Obstructive sleep apnea (adult) (pediatric): Secondary | ICD-10-CM

## 2014-07-16 NOTE — Assessment & Plan Note (Signed)
The patient continues to do well with C Pap, and his download shows excellent compliance and good control of his AHI. He feels he is sleeping well with the device, and is satisfied with his daytime alertness. I have asked him to keep up with his mask changes and supplies, and to keep working aggressively on weight reduction.

## 2014-07-16 NOTE — Patient Instructions (Signed)
Continue with cpap and keep up with the mask changes and supplies. Work on weight loss.  followup with me again in one year.

## 2014-07-16 NOTE — Progress Notes (Signed)
   Subjective:    Patient ID: Timothy Bernard, male    DOB: Sep 14, 1955, 58 y.o.   MRN: [id number]  HPI The patient comes in today for follow-up of his obstructive sleep apnea. He is wearing C Pap compliantly, and is having no issues with his mask fit or pressure. His download shows excellent control of his AHI. The patient is satisfied with his sleep and daytime alertness. Of note, the patient's weight is up 2 pounds since his last visit.   Review of Systems  Constitutional: Negative for fever and unexpected weight change.  HENT: Negative for congestion, dental problem, ear pain, nosebleeds, postnasal drip, rhinorrhea, sinus pressure, sneezing, sore throat and trouble swallowing.   Eyes: Negative for redness and itching.  Respiratory: Negative for cough, chest tightness, shortness of breath and wheezing.   Cardiovascular: Negative for palpitations and leg swelling.  Gastrointestinal: Negative for nausea and vomiting.  Genitourinary: Negative for dysuria.  Musculoskeletal: Negative for joint swelling.  Skin: Negative for rash.  Neurological: Negative for headaches.  Hematological: Does not bruise/bleed easily.  Psychiatric/Behavioral: Negative for dysphoric mood. The patient is not nervous/anxious.        Objective:   Physical Exam Obese male in no acute distress Nose without purulence or discharge noted No skin breakdown or pressure necrosis from the C Pap mask Neck without lymphadenopathy or thyromegaly Lower extremities with mild edema, no cyanosis Alert and oriented, moves all 4 extremities.       Assessment & Plan:

## 2014-07-30 ENCOUNTER — Other Ambulatory Visit: Payer: Self-pay | Admitting: Cardiology

## 2014-08-12 ENCOUNTER — Other Ambulatory Visit: Payer: Self-pay | Admitting: Cardiology

## 2014-08-16 ENCOUNTER — Other Ambulatory Visit: Payer: Self-pay | Admitting: *Deleted

## 2014-08-16 MED ORDER — LISINOPRIL 10 MG PO TABS: 10.0000 mg | ORAL_TABLET | Freq: Every day | ORAL | Status: DC

## 2014-09-03 ENCOUNTER — Ambulatory Visit: Payer: 59 | Admitting: Internal Medicine

## 2014-09-03 ENCOUNTER — Encounter: Payer: Self-pay | Admitting: Internal Medicine

## 2014-09-03 ENCOUNTER — Ambulatory Visit (INDEPENDENT_AMBULATORY_CARE_PROVIDER_SITE_OTHER): Payer: 59 | Admitting: Internal Medicine

## 2014-09-03 VITALS — BP 140/80 | HR 72 | Temp 97.9°F | Wt 248.0 lb

## 2014-09-03 DIAGNOSIS — IMO0001 Reserved for inherently not codable concepts without codable children: Secondary | ICD-10-CM

## 2014-09-03 DIAGNOSIS — I712 Thoracic aortic aneurysm, without rupture, unspecified: Secondary | ICD-10-CM

## 2014-09-03 DIAGNOSIS — I10 Essential (primary) hypertension: Secondary | ICD-10-CM

## 2014-09-03 DIAGNOSIS — E785 Hyperlipidemia, unspecified: Secondary | ICD-10-CM

## 2014-09-03 DIAGNOSIS — E1165 Type 2 diabetes mellitus with hyperglycemia: Secondary | ICD-10-CM

## 2014-09-03 LAB — HEMOGLOBIN A1C: Hgb A1c MFr Bld: 9.4 % — ABNORMAL HIGH (ref 4.6–6.5)

## 2014-09-03 MED ORDER — LISINOPRIL 20 MG PO TABS: 40.0000 mg | ORAL_TABLET | Freq: Every day | ORAL | Status: DC

## 2014-09-03 NOTE — Assessment & Plan Note (Signed)
He is not comfortable increasing crestor dose and he states that Dr Aundra Dubin wants him to stay on this Would consider atorvastatin 20mg  if LDL still over 100 next time

## 2014-09-03 NOTE — Progress Notes (Signed)
Subjective:    Patient ID: Timothy Bernard, male    DOB: [birth year], 59 y.o.   MRN: [id number]  HPI Here for follow up of diabetes  Has moderated his diet some Very busy at work--more physically active Has lost 11# since last visit Checks sugars every other day 137, 154 is last 2 and fairly typical for him No hypoglycemic reactions Has been compliant with bid metformin  No dizziness or syncope No chest pain  No SOB No edema Confusion with his 2 lisinoprils--discussed this  No problems with statin Discussed his LDL Dr Aundra Dubin wants him to stay with brand drug and patient is concerned about crestor and prefers no increase  Current Outpatient Prescriptions on File Prior to Visit  Medication Sig Dispense Refill  . aspirin 81 MG tablet Take 81 mg by mouth daily.      . CRESTOR 5 MG tablet Take 1 tablet by mouth  daily 90 tablet 0  . Flaxseed, Linseed, (FLAX SEED OIL) 1000 MG CAPS Take by mouth daily.      [name] glucose blood (ONE TOUCH ULTRA TEST) test strip Use as directed to test blood sugar once daily dx: 250.00 100 each 3  . Lancets (ONETOUCH ULTRASOFT) lancets 1 each by Other route daily. Dx:250.00 100 each 11  . metFORMIN (GLUCOPHAGE) 1000 MG tablet TAKE 1 TABLET TWICE A DAY WITH A MEAL 60 tablet 5  . metoprolol succinate (TOPROL-XL) 50 MG 24 hr tablet Take 1 tablet by mouth  daily 90 tablet 2  . VIAGRA 100 MG tablet TAKE 1/2 TABLETS BY MOUTH DAILY AS NEEDED 10 tablet 3   No current facility-administered medications on file prior to visit.    No Known Allergies  Past Medical History  Diagnosis Date  . Hyperlipidemia   . HTN (hypertension)   . Adenomatous colon polyp 09/29/2007    79mm cecal adenomatous polyp 10 mm adenoama, post-polypectomy burn syndrome  . Bicuspid aortic valve     echo (5/10) with bicuspid AoV (upon re-review), mild LV dilation, EF 50-55%, no aortic stenosis, mild AI, moderate ascending aortic dilation (4.8 cm). 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 Echo  (3/11) bicuspid aortic valve with no AS but mild AI, mild LVH, EF 60%.    . Ascending aortic aneurysm     4.8 cm on ech (5/10), 4.6 x 4.6 cm by MRA 6/10 4.6 cm aortic root and ascending aorta by MRA 9/10. MRA chest (3/11) 4.5 ascending aorta.   . OSA on CPAP   . 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 Ascending aortic aneurysm     Past Surgical History  Procedure Laterality Date  . Colonoscopy w/ biopsies and polypectomy  09/29/2007    Family History  Problem Relation Age of Onset  . Hypertension Mother     HTN on mothers side.   . Coronary artery disease Maternal Grandfather   . Colon cancer Neg Hx   . Prostate cancer Neg Hx     History   Social History  . Marital Status: Married    Spouse Name: N/A    Number of Children: N/A  . 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 Smoking status: Never Smoker   . Smokeless tobacco: Never Used  . Alcohol Use: No  . Drug Use: No  . Sexual Activity: Not on file   Other Topics Concern  . Not on file   Social History Narrative  Land.    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 Can leave msg on answering machine.    Review of Systems  Recent colonoscopy-- 6 polyps, one 58mm-----discussed need to keep up Sleeps well No troubles with voiding     Objective:   Physical Exam  Constitutional: He appears well-developed and well-nourished. No distress.  Neck: Normal range of motion. Neck supple. No thyromegaly present.  Cardiovascular: Normal rate, regular rhythm and intact distal pulses.  Exam reveals no gallop.   Gr 2/6 coarse aortic murmur  Pulmonary/Chest: Effort normal and breath sounds normal. No respiratory distress. He has no wheezes. He has no rales.  Musculoskeletal: He exhibits no edema or tenderness.  Lymphadenopathy:    He has no  cervical adenopathy.  Neurological:  Normal sensation in feet  Skin: No rash noted. No erythema.  No foot lesions  Psychiatric: He has a normal mood and affect. His behavior is normal.          Assessment & Plan:

## 2014-09-03 NOTE — Progress Notes (Signed)
Pre visit review using our clinic review tool, if applicable. No additional management support is needed unless otherwise documented below in the visit note. 

## 2014-09-03 NOTE — Assessment & Plan Note (Signed)
Seems to be doing better with diet Discussed increasing exercise If close to 8%, will not add glipizide yet

## 2014-09-03 NOTE — Assessment & Plan Note (Signed)
BP Readings from Last 3 Encounters:  09/03/14 140/80  07/16/14 120/62  07/09/14 123/69   Will change lisinopril to 40mg  daily to avoid misunderstandings with dosing

## 2014-09-03 NOTE — Assessment & Plan Note (Signed)
Gets yearly follow up

## 2014-09-06 ENCOUNTER — Other Ambulatory Visit: Payer: Self-pay | Admitting: *Deleted

## 2014-09-06 MED ORDER — GLIPIZIDE ER 5 MG PO TB24
5.0000 mg | ORAL_TABLET | Freq: Every day | ORAL | Status: DC
Start: 2014-09-06 — End: 2015-09-16

## 2014-09-11 ENCOUNTER — Other Ambulatory Visit: Payer: Self-pay | Admitting: Internal Medicine

## 2014-09-11 LAB — HM DIABETES EYE EXAM

## 2014-09-16 ENCOUNTER — Other Ambulatory Visit: Payer: Self-pay | Admitting: *Deleted

## 2014-09-16 ENCOUNTER — Encounter: Payer: Self-pay | Admitting: Internal Medicine

## 2014-10-10 ENCOUNTER — Other Ambulatory Visit: Payer: Self-pay | Admitting: Cardiology

## 2015-02-06 ENCOUNTER — Other Ambulatory Visit: Payer: Self-pay | Admitting: Cardiology

## 2015-02-10 ENCOUNTER — Ambulatory Visit (HOSPITAL_COMMUNITY)
Admission: RE | Admit: 2015-02-10 | Discharge: 2015-02-10 | Disposition: A | Discharge status: Home / Self Care | Payer: 59 | Source: Ambulatory Visit | Attending: Cardiology | Admitting: Cardiology

## 2015-02-10 DIAGNOSIS — I712 Thoracic aortic aneurysm, without rupture, unspecified: Secondary | ICD-10-CM

## 2015-02-10 DIAGNOSIS — I359 Nonrheumatic aortic valve disorder, unspecified: Secondary | ICD-10-CM | POA: Diagnosis present

## 2015-02-10 LAB — CREATININE, SERUM
CREATININE: 1.17 mg/dL (ref 0.61–1.24)
GFR calc Af Amer: >60 mL/min (ref >60)
GFR calc non Af Amer: >60 mL/min (ref >60)

## 2015-02-18 ENCOUNTER — Ambulatory Visit (HOSPITAL_COMMUNITY): Payer: 59

## 2015-02-18 ENCOUNTER — Ambulatory Visit (HOSPITAL_COMMUNITY)
Admission: RE | Admit: 2015-02-18 | Discharge: 2015-02-18 | Disposition: A | Discharge status: Home / Self Care | Payer: 59 | Source: Ambulatory Visit | Attending: Cardiology | Admitting: Cardiology

## 2015-02-18 DIAGNOSIS — Q231 Congenital insufficiency of aortic valve: Secondary | ICD-10-CM | POA: Insufficient documentation

## 2015-02-18 DIAGNOSIS — I77810 Thoracic aortic ectasia: Secondary | ICD-10-CM | POA: Diagnosis not present

## 2015-02-18 DIAGNOSIS — I359 Nonrheumatic aortic valve disorder, unspecified: Secondary | ICD-10-CM

## 2015-02-18 DIAGNOSIS — I712 Thoracic aortic aneurysm, without rupture, unspecified: Secondary | ICD-10-CM

## 2015-02-18 MED ORDER — GADOBENATE DIMEGLUMINE 529 MG/ML IV SOLN
35.0000 mL | Freq: Once | INTRAVENOUS | Status: AC
  Administered 2015-02-18: 35 mL via INTRAVENOUS

## 2015-02-19 DIAGNOSIS — I712 Thoracic aortic aneurysm, without rupture: Secondary | ICD-10-CM | POA: Diagnosis not present

## 2015-02-19 DIAGNOSIS — I359 Nonrheumatic aortic valve disorder, unspecified: Secondary | ICD-10-CM | POA: Diagnosis not present

## 2015-02-20 ENCOUNTER — Ambulatory Visit: Payer: 59 | Admitting: Physician Assistant

## 2015-03-08 ENCOUNTER — Other Ambulatory Visit: Payer: Self-pay | Admitting: Internal Medicine

## 2015-03-12 ENCOUNTER — Encounter: Payer: Self-pay | Admitting: Internal Medicine

## 2015-03-12 ENCOUNTER — Ambulatory Visit (INDEPENDENT_AMBULATORY_CARE_PROVIDER_SITE_OTHER): Payer: 59 | Admitting: Internal Medicine

## 2015-03-12 VITALS — BP 118/70 | HR 72 | Temp 97.5°F | Ht 72.0 in | Wt 245.0 lb

## 2015-03-12 DIAGNOSIS — E1165 Type 2 diabetes mellitus with hyperglycemia: Secondary | ICD-10-CM | POA: Diagnosis not present

## 2015-03-12 DIAGNOSIS — Z125 Encounter for screening for malignant neoplasm of prostate: Secondary | ICD-10-CM

## 2015-03-12 DIAGNOSIS — E785 Hyperlipidemia, unspecified: Secondary | ICD-10-CM

## 2015-03-12 DIAGNOSIS — IMO0001 Reserved for inherently not codable concepts without codable children: Secondary | ICD-10-CM

## 2015-03-12 DIAGNOSIS — Z0000 Encounter for general adult medical examination without abnormal findings: Secondary | ICD-10-CM

## 2015-03-12 DIAGNOSIS — I10 Essential (primary) hypertension: Secondary | ICD-10-CM

## 2015-03-12 DIAGNOSIS — I712 Thoracic aortic aneurysm, without rupture, unspecified: Secondary | ICD-10-CM

## 2015-03-12 LAB — COMPREHENSIVE METABOLIC PANEL
ALBUMIN: 4.1 g/dL (ref 3.5–5.2)
ALT: 25 U/L (ref 0–53)
AST: 23 U/L (ref 0–37)
Alkaline Phosphatase: 62 U/L (ref 39–117)
BILIRUBIN TOTAL: 0.4 mg/dL (ref 0.2–1.2)
BUN: 14 mg/dL (ref 6–23)
CALCIUM: 9.3 mg/dL (ref 8.4–10.5)
CO2: 27 mEq/L (ref 19–32)
Chloride: 106 mEq/L (ref 96–112)
Creatinine, Ser: 1.27 mg/dL (ref 0.40–1.50)
GFR: 61.66 mL/min (ref >60.00)
Glucose, Bld: 113 mg/dL — ABNORMAL HIGH (ref 70–99)
Potassium: 4.4 mEq/L (ref 3.5–5.1)
Sodium: 139 mEq/L (ref 135–145)
Total Protein: 7 g/dL (ref 6.0–8.3)

## 2015-03-12 LAB — CBC WITH DIFFERENTIAL/PLATELET
BASOS ABS: 0 10*3/uL (ref 0.0–0.1)
BASOS PCT: 0.4 % (ref 0.0–3.0)
EOS ABS: 0.1 10*3/uL (ref 0.0–0.7)
Eosinophils Relative: 1 % (ref 0.0–5.0)
HCT: 43.1 % (ref 39.0–52.0)
Hemoglobin: 14.1 g/dL (ref 13.0–17.0)
Lymphocytes Relative: 34.9 % (ref 12.0–46.0)
Lymphs Abs: 3.2 10*3/uL (ref 0.7–4.0)
MCHC: 32.8 g/dL (ref 30.0–36.0)
MCV: 91.2 fl (ref 78.0–100.0)
Monocytes Absolute: 0.5 10*3/uL (ref 0.1–1.0)
Monocytes Relative: 5.5 % (ref 3.0–12.0)
NEUTROS ABS: 5.4 10*3/uL (ref 1.4–7.7)
Neutrophils Relative %: 58.2 % (ref 43.0–77.0)
Platelets: 282 10*3/uL (ref 150.0–400.0)
RBC: 4.73 Mil/uL (ref 4.22–5.81)
RDW: 13.8 % (ref 11.5–15.5)
WBC: 9.2 10*3/uL (ref 4.0–10.5)

## 2015-03-12 LAB — LIPID PANEL
Cholesterol: 123 mg/dL (ref 0–200)
HDL: 32.8 mg/dL — AB (ref >39.00)
LDL Cholesterol: 61 mg/dL (ref 0–99)
NonHDL: 90.2
Total CHOL/HDL Ratio: 4
Triglycerides: 145 mg/dL (ref 0.0–149.0)
VLDL: 29 mg/dL (ref 0.0–40.0)

## 2015-03-12 LAB — PSA: PSA: 2 ng/mL (ref 0.10–4.00)

## 2015-03-12 LAB — HEMOGLOBIN A1C: HEMOGLOBIN A1C: 8.6 % — AB (ref 4.6–6.5)

## 2015-03-12 LAB — HM DIABETES FOOT EXAM

## 2015-03-12 LAB — T4, FREE: Free T4: 0.79 ng/dL (ref 0.60–1.60)

## 2015-03-12 NOTE — Progress Notes (Signed)
Pre visit review using our clinic review tool, if applicable. No additional management support is needed unless otherwise documented below in the visit note. 

## 2015-03-12 NOTE — Assessment & Plan Note (Signed)
No problems with statin 

## 2015-03-12 NOTE — Progress Notes (Signed)
Subjective:    Patient ID: Timothy Bernard, male    DOB: Nov 16, 1955, 59 y.o.   MRN: [id number]  HPI Here for physical  Has spot on the bottom of his left foot Feels like a callous--but inside the skin Tender for a while---better with new insoles Normal sensation in feet  Did start the glipizide by February Missed it past week --forgot it (bottle in different place) Lost 3# more---even on this Checks sugars every other day now-- 120-140's mostly fasting. Some up to 160's No hypoglycemic reactions  Recent MRI  Aortic aneurysm has shrunk Trying to walk regularly and doing more regular activity No chest pain  No SOB No palpitations  Current Outpatient Prescriptions on File Prior to Visit  Medication Sig Dispense Refill  . aspirin 81 MG tablet Take 81 mg by mouth daily.      . CRESTOR 5 MG tablet Take 1 tablet by mouth  daily 90 tablet 0  . Flaxseed, Linseed, (FLAX SEED OIL) 1000 MG CAPS Take by mouth daily.      [name] glipiZIDE (GLUCOTROL XL) 5 MG 24 hr tablet Take 1 tablet (5 mg total) by mouth daily with breakfast. 90 tablet 3  . glucose blood (ONE TOUCH ULTRA TEST) test strip Use as directed to test blood sugar once daily dx: 250.00 100 each 3  . Lancets (ONETOUCH ULTRASOFT) lancets 1 each by Other route daily. Dx:250.00 100 each 11  . lisinopril (PRINIVIL,ZESTRIL) 20 MG tablet Take 2 tablets (40 mg total) by mouth daily. 180 tablet 3  . metFORMIN (GLUCOPHAGE) 1000 MG tablet TAKE 1 TABLET TWICE A DAY WITH A MEAL 60 tablet 0  . metoprolol succinate (TOPROL-XL) 50 MG 24 hr tablet Take 1 tablet by mouth  daily 90 tablet 0  . VIAGRA 100 MG tablet TAKE 1/2 TABLETS BY MOUTH DAILY AS NEEDED 10 tablet 3   No current facility-administered medications on file prior to visit.    No Known Allergies  Past Medical History  Diagnosis Date  . Hyperlipidemia   . HTN (hypertension)   . Adenomatous colon polyp 09/29/2007    30mm cecal adenomatous polyp 10 mm adenoama, post-polypectomy burn  syndrome  . Bicuspid aortic valve     echo (5/10) with bicuspid AoV (upon re-review), mild LV dilation, EF 50-55%, no aortic stenosis, mild AI, moderate ascending aortic dilation (4.8 cm). 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 Echo (3/11) bicuspid aortic valve with no AS but mild AI, mild LVH, EF 60%.    . Ascending aortic aneurysm     4.8 cm on ech (5/10), 4.6 x 4.6 cm by MRA 6/10 4.6 cm aortic root and ascending aorta by MRA 9/10. MRA chest (3/11) 4.5 ascending aorta.   . OSA on CPAP   . 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 Ascending aortic aneurysm     Past Surgical History  Procedure Laterality Date  . Colonoscopy w/ biopsies and polypectomy  09/29/2007    Family History  Problem Relation Age of Onset  . Hypertension Mother     HTN on mothers side.   . Coronary artery disease Maternal Grandfather   . Colon cancer Neg Hx   . Prostate cancer Neg Hx     History   Social History  . Marital Status: Married    Spouse Name: N/A  . Number of Children: N/A  . 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 Smoking  status: Never Smoker   . Smokeless tobacco: Never Used  . Alcohol Use: No  . Drug Use: No  . Sexual Activity: Not on file   Other Topics Concern  . Not on file   Social History Narrative   Land.    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 Can leave msg on answering machine.    Review of Systems  Constitutional: Negative for fatigue and unexpected weight change.       Wears seat belt  HENT: Negative for dental problem, hearing loss and tinnitus.        Keeps up with dentist  Eyes: Negative for visual disturbance.       No diplopia or unilateral vision loss  Gastrointestinal: Negative for nausea, vomiting, abdominal pain, constipation and blood in stool.       No heartburn     Endocrine: Negative for polydipsia and polyuria.  Genitourinary: Negative for urgency, frequency and difficulty urinating.       No sexual problems  Musculoskeletal: Negative for back pain, joint swelling and arthralgias.  Skin: Negative for rash.       No suspicious lesions  Allergic/Immunologic: Negative for environmental allergies and immunocompromised state.  Neurological: Negative for dizziness, syncope, weakness, light-headedness, numbness and headaches.  Psychiatric/Behavioral: Positive for sleep disturbance. Negative for dysphoric mood. The patient is not nervous/anxious.        Sleeps well with CPAP       Objective:   Physical Exam  Constitutional: He is oriented to person, place, and time. He appears well-developed and well-nourished. No distress.  HENT:  Head: Normocephalic and atraumatic.  Right Ear: External ear normal.  Left Ear: External ear normal.  Mouth/Throat: Oropharynx is clear and moist. No oropharyngeal exudate.  Eyes: Conjunctivae and EOM are normal. Pupils are equal, round, and reactive to light.  Neck: Normal range of motion. Neck supple. No thyromegaly present.  Cardiovascular: Normal rate, regular rhythm, normal heart sounds and intact distal pulses.  Exam reveals no gallop.   No murmur heard. Pulmonary/Chest: Effort normal and breath sounds normal. No respiratory distress. He has no wheezes. He has no rales.  Abdominal: Soft. There is no tenderness.  Musculoskeletal: He exhibits no edema or tenderness.  Lymphadenopathy:    He has no cervical adenopathy.  Neurological: He is alert and oriented to person, place, and time.  Normal fine sensation in feet  Skin: No rash noted.  Slight plantar callous--more on left Small wart under left 5th MTP--not tender No ulcers  Psychiatric: He has a normal mood and affect. His behavior is normal.          Assessment & Plan:

## 2015-03-12 NOTE — Assessment & Plan Note (Signed)
No evidence of progression on recent MRI

## 2015-03-12 NOTE — Assessment & Plan Note (Signed)
Relatively healthy Agrees to do PSA after discussion  Colon not due till 2020 Yearly flu vaccine

## 2015-03-12 NOTE — Assessment & Plan Note (Signed)
If not under 8%, will increase the glipizide to bid

## 2015-03-12 NOTE — Assessment & Plan Note (Signed)
BP Readings from Last 3 Encounters:  03/12/15 118/70  09/03/14 140/80  07/16/14 120/62   Good control now Due for labs

## 2015-04-02 ENCOUNTER — Other Ambulatory Visit: Payer: Self-pay | Admitting: Cardiology

## 2015-04-26 ENCOUNTER — Other Ambulatory Visit: Payer: Self-pay | Admitting: Internal Medicine

## 2015-05-06 ENCOUNTER — Encounter: Payer: Self-pay | Admitting: *Deleted

## 2015-05-09 ENCOUNTER — Ambulatory Visit (INDEPENDENT_AMBULATORY_CARE_PROVIDER_SITE_OTHER): Payer: 59 | Admitting: Cardiology

## 2015-05-09 ENCOUNTER — Encounter: Payer: Self-pay | Admitting: Cardiology

## 2015-05-09 ENCOUNTER — Encounter: Payer: Self-pay | Admitting: *Deleted

## 2015-05-09 VITALS — BP 110/66 | HR 73 | Ht 72.0 in | Wt 255.4 lb

## 2015-05-09 DIAGNOSIS — I712 Thoracic aortic aneurysm, without rupture, unspecified: Secondary | ICD-10-CM

## 2015-05-09 DIAGNOSIS — Q231 Congenital insufficiency of aortic valve: Secondary | ICD-10-CM

## 2015-05-09 DIAGNOSIS — I359 Nonrheumatic aortic valve disorder, unspecified: Secondary | ICD-10-CM | POA: Diagnosis not present

## 2015-05-09 DIAGNOSIS — I10 Essential (primary) hypertension: Secondary | ICD-10-CM | POA: Diagnosis not present

## 2015-05-09 DIAGNOSIS — I7121 Aneurysm of the ascending aorta, without rupture: Secondary | ICD-10-CM

## 2015-05-09 NOTE — Patient Instructions (Signed)
Medication Instructions:  No changes today  Labwork: None today  Testing/Procedures: Your physician has requested that you have an echocardiogram. Echocardiography is a painless test that uses sound waves to create images of your heart. It provides your doctor with information about the size and shape of your heart and how well your heart's chambers and valves are working. This procedure takes approximately one hour. There are no restrictions for this procedure. June 2017  Schedule an appointment for an MRA of your chest in June 2017  Follow-Up: Your physician wants you to follow-up in: 1 year with Dr Aundra Dubin. (September 2017). You will receive a reminder letter in the mail two months in advance. If you don't receive a letter, please call our office to schedule the follow-up appointment.

## 2015-05-11 NOTE — Progress Notes (Signed)
Patient ID: Timothy Bernard, male   DOB: Feb 26, 1956, 59 y.o.   MRN: [id number] PCP: Dr. Silvio Pate  59 yo with bicuspid aortic valve disorder and dilated ascending aorta returns for evaluation.  BP has been under good control at home.  No exertional chest pain or dyspnea.  No lightheadedness.  He walks 2-10 miles/day (total on his fitbit).  MRA of aorta was done in 6/16, with stable 4.4 cm dilation of the ascending aorta.  cMRI in 6/16 showed bicuspid aortic valve with mild AS and mild AI.   ECG: NSR, LAFB, LVH  Labs (9/10): LDL 134, HDL 24 Labs (12/10): LDL 73, HDL 32 Labs (3/11): K 4.3, creatinine 1.3 Labs (2/12): K 4.5, creatinine 1.3 Labs (3/13): K 4.4, creatinine 1.1 Labs (5/13): LDL 50, HDL 33 Labs (3/14): LDL 60, HDL 29, K 4.3, creatinine 1.1 Labs (6/15): LDL 47, HDL 33, K 4.4, creatinine 1.2 Labs (7/16): K 4.4, creatinine 1.22, LDL 61, HDl 33  Allergies:  No Known Drug Allergies  Past Medical History: 1. Hyperlipidemia 2. Hypertension 3. Colon Polyps--adenomatous  4. Post Polypectomy Bleed 5. Bicuspid aortic valve: Echo (5/10) with bicuspid AoV (upon re-review), mild LV dilation, EF 50-55%, no aortic stenosis, mild AI, moderate ascending aortic dilation (4.8 cm).  Bicuspid valve confirmed by MRI.  Echo (3/11) bicuspid aortic valve with no AS but mild AI, mild LVH, EF 60%.  Echo (6/12): Mild LV hypertrophy, EF 55-60%, mild AI, no AS, mild MR.  Echo (6/14) with EF 55-60%, bicuspid aortic valve, mild AI, very mild AS, moderate LVH.  Echo (6/15) with 55-60%, moderate LVH, bicuspid aortic valve, mild to moderate AI, mild AS.  cMRI (6/16) with EF 55%, normal RV size and systolic function, bicuspid aortic valve with mild AS/mild AI (regurgitant fraction 15%).  6. Ascending aortic aneurysm: 4.8 cm on echo (5/10), 4.6 x 4.6 cm by MRA 6/10, 4.6 cm aortic root and ascending aorta by MRA 9/10.  MRA chest (3/11) 4.5 cm ascending aorta.  MRA chest (3/12): 4.6 cm ascending aorta. MRA chest (3/13): 4.5  cm ascending aortic aneurysm.  MRA chest 3/14 with 4.5 cm ascending aorta.  MRA chest (6/15) with 4.7 cm ascending aortic aneurysm.  MRA chest (6/16) with 4.4 cm ascending aorta.  7. OSA on CPAP 8. 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   Current Outpatient Prescriptions  Medication Sig Dispense Refill  . aspirin 81 MG tablet Take 81 mg by mouth daily.      . CRESTOR 5 MG tablet Take 1 tablet by mouth  daily 90 tablet 0  . Flaxseed, Linseed, (FLAX SEED OIL) 1000 MG CAPS Take 1 capsule by mouth daily.     [name] glipiZIDE (GLUCOTROL XL) 5 MG 24 hr tablet Take 1 tablet (5 mg total) by mouth daily with breakfast. 90 tablet 3  . glucose blood (ONE TOUCH ULTRA TEST) test strip Use as directed to test blood sugar once daily dx: 250.00 100 each 3  . Lancets (ONETOUCH ULTRASOFT) lancets 1 each by Other route daily. Dx:250.00 100 each 11  . lisinopril (PRINIVIL,ZESTRIL) 20 MG tablet Take 2 tablets (40 mg total) by mouth daily. 180 tablet  3  . metFORMIN (GLUCOPHAGE) 1000 MG tablet Take 1,000 mg by mouth 2 (two) times daily with a meal.    . metoprolol succinate (TOPROL-XL) 50 MG 24 hr tablet Take 1 tablet by mouth  daily 90 tablet 0  . sildenafil (VIAGRA) 100 MG tablet Take 50 mg by mouth as directed.     No current facility-administered medications for this visit.    BP 110/66 mmHg  Pulse 73  Ht 6' (1.829 m)  Wt 255 lb 6.4 oz (115.849 kg)  BMI 34.63 kg/m2 General:  Well developed, well nourished, in no acute distress.  Obese.  Neck:  Neck supple, no JVD. No masses, thyromegaly or abnormal cervical nodes. Lungs:  Clear bilaterally to auscultation and percussion. Heart:   Non-displaced PMI, chest non-tender; regular rate and rhythm, S1, S2 without rubs or gallops. 1/6 early systolic ejection murmur RUSB.  Carotid upstroke normal, no bruit. Pedals normal pulses. No edema.  Abdomen:  Bowel sounds positive; abdomen soft and non-tender without masses, organomegaly, or hernias noted. No hepatosplenomegaly. Extremities:  No clubbing or cyanosis. Neurologic:  Alert and oriented x 3. Psych:  Normal affect.  Assessment/Plan: 1. Bicuspid aortic valve: Mild AI, mild AS on last study (cardiac MRI).  I will arrange for echo in 1 year to assess aortic valve.   2. Ascending aortic aneurysm: 4.4 cm in 6/16, stable to decreased.  Related to bicuspid aortic valve.  Needs good BP control.  Will get MRA chest in 1 year for followup.  Continue Toprol XL and lisinopril.  3. HTN: BP is well-controlled on current regimen.  4. Hyperlipidemia: Good lipids in 7/16.   Followup in 1 year.   Loralie Champagne 05/11/2015

## 2015-05-21 ENCOUNTER — Encounter: Payer: Self-pay | Admitting: Pulmonary Disease

## 2015-05-26 ENCOUNTER — Other Ambulatory Visit: Payer: Self-pay | Admitting: Internal Medicine

## 2015-06-09 ENCOUNTER — Other Ambulatory Visit: Payer: Self-pay | Admitting: Internal Medicine

## 2015-06-09 NOTE — Telephone Encounter (Signed)
Approved: #10 x 11

## 2015-06-14 ENCOUNTER — Other Ambulatory Visit: Payer: Self-pay | Admitting: Internal Medicine

## 2015-06-14 ENCOUNTER — Other Ambulatory Visit: Payer: Self-pay | Admitting: Cardiology

## 2015-07-18 ENCOUNTER — Ambulatory Visit: Payer: 59 | Admitting: Pulmonary Disease

## 2015-09-16 ENCOUNTER — Encounter: Payer: Self-pay | Admitting: Internal Medicine

## 2015-09-16 ENCOUNTER — Ambulatory Visit (INDEPENDENT_AMBULATORY_CARE_PROVIDER_SITE_OTHER): Payer: 59 | Admitting: Internal Medicine

## 2015-09-16 VITALS — BP 120/78 | HR 70 | Temp 97.9°F | Wt 251.0 lb

## 2015-09-16 DIAGNOSIS — IMO0001 Reserved for inherently not codable concepts without codable children: Secondary | ICD-10-CM

## 2015-09-16 DIAGNOSIS — E1165 Type 2 diabetes mellitus with hyperglycemia: Secondary | ICD-10-CM

## 2015-09-16 LAB — HEMOGLOBIN A1C: Hgb A1c MFr Bld: 8.7 % — ABNORMAL HIGH (ref 4.6–6.5)

## 2015-09-16 MED ORDER — GLUCOSE BLOOD VI STRP: ORAL_STRIP | Status: DC

## 2015-09-16 MED ORDER — GLIPIZIDE ER 5 MG PO TB24: 5.0000 mg | ORAL_TABLET | Freq: Every day | ORAL | Status: DC

## 2015-09-16 NOTE — Progress Notes (Signed)
Subjective:    Patient ID: Timothy Bernard, male    DOB: 1956/03/29, 60 y.o.   MRN: [id number]  HPI Here for follow up of diabetes   Doing okay Still taking glipizide once a day Sugars running higher-- 150's fasting No hypoglycemic reactions No foot sores, pain or numbness Has eye exam scheduled next week  No problems with heart Working more so not much exercise  Current Outpatient Prescriptions on File Prior to Visit  Medication Sig Dispense Refill  . aspirin 81 MG tablet Take 81 mg by mouth daily.      . Flaxseed, Linseed, (FLAX SEED OIL) 1000 MG CAPS Take 1 capsule by mouth daily.     . Lancets (ONETOUCH ULTRASOFT) lancets 1 each by Other route daily. Dx:250.00 100 each 11  . lisinopril (PRINIVIL,ZESTRIL) 20 MG tablet Take 2 tablets by mouth  daily 180 tablet 3  . metFORMIN (GLUCOPHAGE) 1000 MG tablet Take 1,000 mg by mouth 2 (two) times daily with a meal.    . metoprolol succinate (TOPROL-XL) 50 MG 24 hr tablet Take 1 tablet by mouth  daily 90 tablet 3  . rosuvastatin (CRESTOR) 5 MG tablet Take 1 tablet by mouth  daily 90 tablet 3  . VIAGRA 100 MG tablet TAKE 1/2 TABLET BY MOUTH DAILY AS NEEDED 10 tablet 11   No current facility-administered medications on file prior to visit.    No Known Allergies  Past Medical History  Diagnosis Date  . Hyperlipidemia   . HTN (hypertension)   . Adenomatous colon polyp 09/29/2007    4mm cecal adenomatous polyp 10 mm adenoama, post-polypectomy burn syndrome  . Bicuspid aortic valve     echo (5/10) with bicuspid AoV (upon re-review), mild LV dilation, EF 50-55%, no aortic stenosis, mild AI, moderate ascending aortic dilation (4.8 cm). 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 Echo (3/11) bicuspid aortic valve with no AS but mild AI, mild LVH, EF 60%.    . Ascending aortic aneurysm (HCC)     4.8 cm on ech (5/10), 4.6 x 4.6 cm by MRA 6/10 4.6 cm aortic root and ascending aorta by MRA 9/10. MRA chest (3/11) 4.5 ascending aorta.   . OSA on CPAP     . 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 Ascending aortic aneurysm [facility])     Past Surgical History  Procedure Laterality Date  . Colonoscopy w/ biopsies and polypectomy  09/29/2007    Family History  Problem Relation Age of Onset  . Hypertension Mother   . Coronary artery disease Maternal Grandfather   . Colon cancer Neg Hx   . Prostate cancer Neg Hx   . Cancer Father   . Diabetes Mother   . Hypertension Other     maternal side    Social History   Social History  . Marital Status: Married    Spouse Name: N/A  . Number of Children: N/A  . 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 Smoking status: Never Smoker   . Smokeless tobacco: Never Used  . Alcohol Use: No  . Drug Use: No  . Sexual Activity: Not on file   Other Topics Concern  . Not on file   Social History Narrative   Land.    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 Can leave msg  on answering machine.    Review of Systems Weight is up a few pounds Sleeping okay    Objective:   Physical Exam  Constitutional: He appears well-developed and well-nourished. No distress.  Neck: Normal range of motion. Neck supple. No thyromegaly present.  Cardiovascular: Normal rate, regular rhythm and intact distal pulses.  Exam reveals no gallop.   G2 2/6 coarse aortic systolic murmur  Pulmonary/Chest: Effort normal and breath sounds normal. No respiratory distress. He has no wheezes. He has no rales.  Musculoskeletal: He exhibits no edema or tenderness.  Lymphadenopathy:    He has no cervical adenopathy.  Skin:  No foot lesions  Psychiatric: He has a normal mood and affect. His behavior is normal.          Assessment & Plan:

## 2015-09-16 NOTE — Assessment & Plan Note (Signed)
Discussed working on lifestyle Fair on eating Work not leaving time for exercise Will increase glipizide if A1c still >8%

## 2015-09-17 ENCOUNTER — Other Ambulatory Visit: Payer: Self-pay | Admitting: *Deleted

## 2015-09-17 MED ORDER — GLIPIZIDE ER 5 MG PO TB24: 5.0000 mg | ORAL_TABLET | Freq: Every day | ORAL | Status: DC

## 2015-09-18 ENCOUNTER — Telehealth: Payer: Self-pay | Admitting: Internal Medicine

## 2015-09-18 NOTE — Telephone Encounter (Signed)
Patient returned Dee's call. I let him know to increase glipizide ER to 10 mg and that the rx would be updated at the pharmacy and I let him know he could see it on my chart.

## 2015-09-22 MED ORDER — GLIPIZIDE ER 10 MG PO TB24: 10.0000 mg | ORAL_TABLET | Freq: Every day | ORAL | Status: DC

## 2015-09-22 NOTE — Telephone Encounter (Signed)
rx sent to pharmacy by e-script  

## 2015-10-31 ENCOUNTER — Other Ambulatory Visit: Payer: Self-pay | Admitting: Internal Medicine

## 2016-02-16 ENCOUNTER — Ambulatory Visit (HOSPITAL_COMMUNITY): Payer: 59 | Attending: Internal Medicine

## 2016-02-16 ENCOUNTER — Other Ambulatory Visit: Payer: Self-pay

## 2016-02-16 ENCOUNTER — Other Ambulatory Visit (INDEPENDENT_AMBULATORY_CARE_PROVIDER_SITE_OTHER): Payer: 59 | Admitting: *Deleted

## 2016-02-16 DIAGNOSIS — I351 Nonrheumatic aortic (valve) insufficiency: Secondary | ICD-10-CM | POA: Diagnosis not present

## 2016-02-16 DIAGNOSIS — I712 Thoracic aortic aneurysm, without rupture: Secondary | ICD-10-CM

## 2016-02-16 DIAGNOSIS — I359 Nonrheumatic aortic valve disorder, unspecified: Secondary | ICD-10-CM | POA: Diagnosis present

## 2016-02-16 DIAGNOSIS — Z8249 Family history of ischemic heart disease and other diseases of the circulatory system: Secondary | ICD-10-CM | POA: Insufficient documentation

## 2016-02-16 DIAGNOSIS — I119 Hypertensive heart disease without heart failure: Secondary | ICD-10-CM | POA: Insufficient documentation

## 2016-02-16 DIAGNOSIS — E119 Type 2 diabetes mellitus without complications: Secondary | ICD-10-CM | POA: Diagnosis not present

## 2016-02-16 DIAGNOSIS — Q231 Congenital insufficiency of aortic valve: Secondary | ICD-10-CM | POA: Diagnosis not present

## 2016-02-16 DIAGNOSIS — I7121 Aneurysm of the ascending aorta, without rupture: Secondary | ICD-10-CM

## 2016-02-16 DIAGNOSIS — E785 Hyperlipidemia, unspecified: Secondary | ICD-10-CM | POA: Insufficient documentation

## 2016-02-16 LAB — BASIC METABOLIC PANEL
BUN: 13 mg/dL (ref 7–25)
CHLORIDE: 104 mmol/L (ref 98–110)
CO2: 26 mmol/L (ref 20–31)
Calcium: 9 mg/dL (ref 8.6–10.3)
Creat: 1.06 mg/dL (ref 0.70–1.25)
GLUCOSE: 164 mg/dL — AB (ref 65–99)
Potassium: 4.4 mmol/L (ref 3.5–5.3)
Sodium: 138 mmol/L (ref 135–146)

## 2016-02-16 NOTE — Addendum Note (Signed)
Addended by: Eulis Foster on: 02/16/2016 07:33 AM   Modules accepted: Orders

## 2016-02-23 ENCOUNTER — Ambulatory Visit (HOSPITAL_COMMUNITY): Admission: RE | Admit: 2016-02-23 | Payer: 59 | Source: Ambulatory Visit

## 2016-02-24 ENCOUNTER — Ambulatory Visit (HOSPITAL_COMMUNITY): Payer: 59

## 2016-03-01 ENCOUNTER — Ambulatory Visit (HOSPITAL_COMMUNITY)
Admission: RE | Admit: 2016-03-01 | Discharge: 2016-03-01 | Disposition: A | Discharge status: Home / Self Care | Payer: 59 | Source: Ambulatory Visit | Attending: Cardiology | Admitting: Cardiology

## 2016-03-01 DIAGNOSIS — I7121 Aneurysm of the ascending aorta, without rupture: Secondary | ICD-10-CM

## 2016-03-01 DIAGNOSIS — Q231 Congenital insufficiency of aortic valve: Secondary | ICD-10-CM | POA: Diagnosis present

## 2016-03-01 DIAGNOSIS — I712 Thoracic aortic aneurysm, without rupture: Secondary | ICD-10-CM

## 2016-03-01 MED ORDER — GADOBENATE DIMEGLUMINE 529 MG/ML IV SOLN
29.0000 mL | Freq: Once | INTRAVENOUS | Status: AC | PRN
  Administered 2016-03-01: 29 mL via INTRAVENOUS

## 2016-03-04 ENCOUNTER — Telehealth: Payer: Self-pay | Admitting: Cardiology

## 2016-03-04 NOTE — Telephone Encounter (Signed)
Follow Up:; ° ° °Returning your call. °

## 2016-03-04 NOTE — Telephone Encounter (Signed)
Spoke with patient about recent MRA chest results

## 2016-03-16 ENCOUNTER — Ambulatory Visit (INDEPENDENT_AMBULATORY_CARE_PROVIDER_SITE_OTHER): Payer: 59 | Admitting: Internal Medicine

## 2016-03-16 ENCOUNTER — Encounter: Payer: Self-pay | Admitting: Internal Medicine

## 2016-03-16 VITALS — BP 114/72 | HR 66 | Temp 98.7°F | Ht 72.0 in | Wt 246.5 lb

## 2016-03-16 DIAGNOSIS — G4733 Obstructive sleep apnea (adult) (pediatric): Secondary | ICD-10-CM

## 2016-03-16 DIAGNOSIS — E785 Hyperlipidemia, unspecified: Secondary | ICD-10-CM | POA: Diagnosis not present

## 2016-03-16 DIAGNOSIS — I10 Essential (primary) hypertension: Secondary | ICD-10-CM | POA: Diagnosis not present

## 2016-03-16 DIAGNOSIS — IMO0001 Reserved for inherently not codable concepts without codable children: Secondary | ICD-10-CM

## 2016-03-16 DIAGNOSIS — E1165 Type 2 diabetes mellitus with hyperglycemia: Secondary | ICD-10-CM | POA: Diagnosis not present

## 2016-03-16 DIAGNOSIS — Z0000 Encounter for general adult medical examination without abnormal findings: Secondary | ICD-10-CM

## 2016-03-16 LAB — HM DIABETES FOOT EXAM

## 2016-03-16 LAB — HEMOGLOBIN A1C: HEMOGLOBIN A1C: 8.2 % — AB (ref 4.6–6.5)

## 2016-03-16 LAB — CBC WITH DIFFERENTIAL/PLATELET
Basophils Absolute: 0 10*3/uL (ref 0.0–0.1)
Basophils Relative: 0.5 % (ref 0.0–3.0)
Eosinophils Absolute: 0.2 10*3/uL (ref 0.0–0.7)
Eosinophils Relative: 2 % (ref 0.0–5.0)
HCT: 42 % (ref 39.0–52.0)
Hemoglobin: 14 g/dL (ref 13.0–17.0)
LYMPHS ABS: 3.4 10*3/uL (ref 0.7–4.0)
Lymphocytes Relative: 38.4 % (ref 12.0–46.0)
MCHC: 33.4 g/dL (ref 30.0–36.0)
MCV: 89.4 fl (ref 78.0–100.0)
Monocytes Absolute: 0.5 10*3/uL (ref 0.1–1.0)
Monocytes Relative: 6.1 % (ref 3.0–12.0)
NEUTROS ABS: 4.7 10*3/uL (ref 1.4–7.7)
NEUTROS PCT: 53 % (ref 43.0–77.0)
PLATELETS: 278 10*3/uL (ref 150.0–400.0)
RBC: 4.7 Mil/uL (ref 4.22–5.81)
RDW: 14.1 % (ref 11.5–15.5)
WBC: 8.9 10*3/uL (ref 4.0–10.5)

## 2016-03-16 LAB — COMPREHENSIVE METABOLIC PANEL
ALT: 17 U/L (ref 0–53)
AST: 17 U/L (ref 0–37)
Albumin: 3.8 g/dL (ref 3.5–5.2)
Alkaline Phosphatase: 72 U/L (ref 39–117)
BILIRUBIN TOTAL: 0.3 mg/dL (ref 0.2–1.2)
BUN: 15 mg/dL (ref 6–23)
CO2: 26 meq/L (ref 19–32)
CREATININE: 1.14 mg/dL (ref 0.40–1.50)
Calcium: 9.1 mg/dL (ref 8.4–10.5)
Chloride: 106 mEq/L (ref 96–112)
GFR: 69.6 mL/min (ref >60.00)
GLUCOSE: 168 mg/dL — AB (ref 70–99)
Potassium: 4.3 mEq/L (ref 3.5–5.1)
SODIUM: 138 meq/L (ref 135–145)
Total Protein: 6.8 g/dL (ref 6.0–8.3)

## 2016-03-16 LAB — LIPID PANEL
Cholesterol: 113 mg/dL (ref 0–200)
HDL: 28.2 mg/dL — AB (ref >39.00)
LDL Cholesterol: 56 mg/dL (ref 0–99)
NonHDL: 84.44
Total CHOL/HDL Ratio: 4
Triglycerides: 140 mg/dL (ref 0.0–149.0)
VLDL: 28 mg/dL (ref 0.0–40.0)

## 2016-03-16 NOTE — Assessment & Plan Note (Signed)
Doing well with CPAP. 

## 2016-03-16 NOTE — Assessment & Plan Note (Signed)
No problems with the statin

## 2016-03-16 NOTE — Assessment & Plan Note (Signed)
BP Readings from Last 3 Encounters:  03/16/16 114/72  09/16/15 120/78  05/09/15 110/66   Good control No changes needed

## 2016-03-16 NOTE — Progress Notes (Signed)
Subjective:    Patient ID: Timothy Bernard, male    DOB: [birth year], 60 y.o.   MRN: [id number]  HPI Here for follow up of diabetes  Doing well Sugar hasn't really dropped  The higher dose of glipizide not doing much Has been eating healthier Not much exericse--- working 7 days per week Checks sugars daily-- average 170 No hypoglycemic spells Still with abnormal area on left foot---?wart. No numbness, pain or ulcers  No chest pain No SOB No dizziness or syncope No edema  No problems with crestor No myalgia or GI problems  Just had follow up angiogram for aneurysm No action needed  Current Outpatient Prescriptions on File Prior to Visit  Medication Sig Dispense Refill  . aspirin 81 MG tablet Take 81 mg by mouth daily.      . Flaxseed, Linseed, (FLAX SEED OIL) 1000 MG CAPS Take 1 capsule by mouth daily.     [name] glipiZIDE (GLUCOTROL XL) 10 MG 24 hr tablet Take 1 tablet (10 mg total) by mouth daily with breakfast. 90 tablet 3  . glucose blood (ONE TOUCH ULTRA TEST) test strip Use to test blood sugar once daily dx: E11.65 100 each 0  . Lancets (ONETOUCH ULTRASOFT) lancets 1 each by Other route daily. Dx:250.00 100 each 11  . lisinopril (PRINIVIL,ZESTRIL) 20 MG tablet Take 2 tablets by mouth  daily 180 tablet 3  . metFORMIN (GLUCOPHAGE) 1000 MG tablet Take 1,000 mg by mouth 2 (two) times daily with a meal.    . metoprolol succinate (TOPROL-XL) 50 MG 24 hr tablet Take 1 tablet by mouth  daily 90 tablet 3  . ONE TOUCH ULTRA TEST test strip TEST SUGAR ONCE A DAY 100 each 1  . rosuvastatin (CRESTOR) 5 MG tablet Take 1 tablet by mouth  daily 90 tablet 3  . VIAGRA 100 MG tablet TAKE 1/2 TABLET BY MOUTH DAILY AS NEEDED 10 tablet 11   No current facility-administered medications on file prior to visit.    No Known Allergies  Past Medical History  Diagnosis Date  . Hyperlipidemia   . HTN (hypertension)   . Adenomatous colon polyp 09/29/2007    64mm cecal adenomatous polyp 10 mm  adenoama, post-polypectomy burn syndrome  . Bicuspid aortic valve     echo (5/10) with bicuspid AoV (upon re-review), mild LV dilation, EF 50-55%, no aortic stenosis, mild AI, moderate ascending aortic dilation (4.8 cm). 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 Echo (3/11) bicuspid aortic valve with no AS but mild AI, mild LVH, EF 60%.    . Ascending aortic aneurysm (HCC)     4.8 cm on ech (5/10), 4.6 x 4.6 cm by MRA 6/10 4.6 cm aortic root and ascending aorta by MRA 9/10. MRA chest (3/11) 4.5 ascending aorta.   . OSA on CPAP   . 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 Ascending aortic aneurysm [facility])     Past Surgical History  Procedure Laterality Date  . Colonoscopy w/ biopsies and polypectomy  09/29/2007    Family History  Problem Relation Age of Onset  . Hypertension Mother   . Coronary artery disease Maternal Grandfather   . Colon cancer Neg Hx   . Prostate cancer Neg Hx   . Cancer Father   . Diabetes Mother   . Hypertension Other     maternal side    Social History   Social History  . Marital Status: Married    Spouse Name: N/A  . Number of Children: N/A  .  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 Smoking status: Never Smoker   . Smokeless tobacco: Never Used  . Alcohol Use: No  . Drug Use: No  . Sexual Activity: Not on file   Other Topics Concern  . Not on file   Social History Narrative   Land.    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 Can leave msg on answering machine.    Review of Systems Weight down almost 5# Sleeps okay--sleeps with CPAP nightly.    Objective:   Physical Exam  Constitutional: He appears well-developed and well-nourished. No distress.  Neck: Normal range of motion. Neck supple. No thyromegaly present.  Cardiovascular: Normal rate, regular rhythm,  normal heart sounds and intact distal pulses.  Exam reveals no gallop.   No murmur heard. Pulmonary/Chest: Effort normal and breath sounds normal. No respiratory distress. He has no wheezes. He has no rales.  Musculoskeletal: He exhibits no edema or tenderness.  Lymphadenopathy:    He has no cervical adenopathy.  Neurological:  Normal sensation in feet  Skin:  Slight callous under left 5th toe No ulcers or other foot lesions  Psychiatric: He has a normal mood and affect. His behavior is normal.          Assessment & Plan:

## 2016-03-16 NOTE — Assessment & Plan Note (Signed)
Not due for cancer screening Yearly flu vaccine

## 2016-03-16 NOTE — Assessment & Plan Note (Signed)
Fasting sugars not better but need to check A1c If still well above 8%, will start basaglar and plan follow up in 2 weeks or so

## 2016-03-21 ENCOUNTER — Other Ambulatory Visit: Payer: Self-pay | Admitting: Internal Medicine

## 2016-04-27 ENCOUNTER — Other Ambulatory Visit: Payer: Self-pay | Admitting: Cardiology

## 2016-05-02 ENCOUNTER — Other Ambulatory Visit: Payer: Self-pay | Admitting: Internal Medicine

## 2016-05-07 ENCOUNTER — Ambulatory Visit (INDEPENDENT_AMBULATORY_CARE_PROVIDER_SITE_OTHER): Payer: 59 | Admitting: Cardiology

## 2016-05-07 VITALS — BP 130/70 | HR 71 | Ht 72.0 in | Wt 249.0 lb

## 2016-05-07 DIAGNOSIS — Q231 Congenital insufficiency of aortic valve: Secondary | ICD-10-CM | POA: Diagnosis not present

## 2016-05-07 DIAGNOSIS — I712 Thoracic aortic aneurysm, without rupture, unspecified: Secondary | ICD-10-CM

## 2016-05-07 DIAGNOSIS — I359 Nonrheumatic aortic valve disorder, unspecified: Secondary | ICD-10-CM

## 2016-05-07 DIAGNOSIS — I7121 Aneurysm of the ascending aorta, without rupture: Secondary | ICD-10-CM

## 2016-05-07 DIAGNOSIS — I10 Essential (primary) hypertension: Secondary | ICD-10-CM | POA: Diagnosis not present

## 2016-05-07 NOTE — Patient Instructions (Signed)
Medication Instructions:  Your physician recommends that you continue on your current medications as directed. Please refer to the Current Medication list given to you today.   Labwork: none  Testing/Procedures: Your physician has requested that you have an echocardiogram. Echocardiography is a painless test that uses sound waves to create images of your heart. It provides your doctor with information about the size and shape of your heart and how well your heart's chambers and valves are working. This procedure takes approximately one hour. There are no restrictions for this procedure. To be done in June 2018    MRA of chest to be done in June 2018  Follow-Up:  Your physician wants you to follow-up in: 12 months with Dr. Saunders Revel. You will receive a reminder letter in the mail two months in advance. If you don't receive a letter, please call our office to schedule the follow-up appointment.   Any Other Special Instructions Will Be Listed Below (If Applicable).     If you need a refill on your cardiac medications before your next appointment, please call your pharmacy.

## 2016-05-08 ENCOUNTER — Encounter: Payer: Self-pay | Admitting: Cardiology

## 2016-05-08 NOTE — Progress Notes (Signed)
Patient ID: Timothy Bernard, male   DOB: [birth year], 60 y.o.   MRN: [id number] PCP: Dr. Silvio Pate  60 yo with bicuspid aortic valve disorder and dilated ascending aorta returns for evaluation.  BP has been under good control at home.  No exertional chest pain or dyspnea.  No lightheadedness.  He walks up to 10 miles/day (total on his Fitbit).  MRA of aorta was done in 7/17, with 4.5 cm dilation of the ascending aorta.  Echo in 6/17 showed bicuspid aortic valve with mild aortic stenosis. Weight is down 6 lbs.   ECG: NSR, anteroseptal Qs  Labs (9/10): LDL 134, HDL 24 Labs (12/10): LDL 73, HDL 32 Labs (3/11): K 4.3, creatinine 1.3 Labs (2/12): K 4.5, creatinine 1.3 Labs (3/13): K 4.4, creatinine 1.1 Labs (5/13): LDL 50, HDL 33 Labs (3/14): LDL 60, HDL 29, K 4.3, creatinine 1.1 Labs (6/15): LDL 47, HDL 33, K 4.4, creatinine 1.2 Labs (7/16): K 4.4, creatinine 1.22, LDL 61, HDl 33 Labs (7/17): LDL 56, HDL 28, K 4.3, creatinine 1.14  Allergies:  No Known Drug Allergies  Past Medical History: 1. Hyperlipidemia 2. Hypertension 3. Colon Polyps--adenomatous  4. Post Polypectomy Bleed 5. Bicuspid aortic valve: Echo (5/10) with bicuspid AoV (upon re-review), mild LV dilation, EF 50-55%, no aortic stenosis, mild AI, moderate ascending aortic dilation (4.8 cm).  Bicuspid valve confirmed by MRI.  Echo (3/11) bicuspid aortic valve with no AS but mild AI, mild LVH, EF 60%.  Echo (6/12): Mild LV hypertrophy, EF 55-60%, mild AI, no AS, mild MR.  Echo (6/14) with EF 55-60%, bicuspid aortic valve, mild AI, very mild AS, moderate LVH.  Echo (6/15) with 55-60%, moderate LVH, bicuspid aortic valve, mild to moderate AI, mild AS.  cMRI (6/16) with EF 55%, normal RV size and systolic function, bicuspid aortic valve with mild AS/mild AI (regurgitant fraction 15%).  - Echo (6/17): EF 50-55%, grade II diastolic dysfunction, bicuspid aortic valve with mild stenosis (mean gradient 11 mmhg), mild MR.  6. Ascending aortic  aneurysm: 4.8 cm on echo (5/10), 4.6 x 4.6 cm by MRA 6/10, 4.6 cm aortic root and ascending aorta by MRA 9/10.  MRA chest (3/11) 4.5 cm ascending aorta.  MRA chest (3/12): 4.6 cm ascending aorta. MRA chest (3/13): 4.5 cm ascending aortic aneurysm.  MRA chest 3/14 with 4.5 cm ascending aorta.  MRA chest (6/15) with 4.7 cm ascending aortic aneurysm.  MRA chest (6/16) with 4.4 cm ascending aorta.  - MRA chest (7/17): 4.5-4.6 cm ascending aorta.  7. OSA on CPAP 8. 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   Current Outpatient Prescriptions  Medication Sig Dispense Refill  . aspirin 81 MG tablet Take 81 mg by mouth daily.      . Flaxseed, Linseed, (FLAX SEED OIL) 1000 MG CAPS Take 1 capsule by mouth daily.     [name] glipiZIDE (GLUCOTROL XL) 10 MG 24 hr tablet Take 1 tablet (10 mg total) by mouth daily with breakfast. 90 tablet 3  . glucose blood (ONE TOUCH ULTRA TEST) test strip Use to test blood sugar once daily dx: E11.65 100 each 0  . Lancets (ONETOUCH ULTRASOFT) lancets 1  each by Other route daily. Dx:250.00 100 each 11  . lisinopril (PRINIVIL,ZESTRIL) 20 MG tablet Take 2 tablets by mouth  daily 180 tablet 3  . metFORMIN (GLUCOPHAGE) 1000 MG tablet TAKE 1 TABLET TWICE A DAY WITH A MEAL 60 tablet 3  . metoprolol succinate (TOPROL-XL) 50 MG 24 hr tablet Take 1 tablet (50 mg total) by mouth daily. Please keep scheduled on 05/07/16 @ 1:30 pm appt for further refills 45 tablet 0  . ONE TOUCH ULTRA TEST test strip TEST SUGAR ONCE DAILY 100 each 3  . rosuvastatin (CRESTOR) 5 MG tablet Take 1 tablet (5 mg total) by mouth daily. Please keep scheduled on 05/07/16 @ 1:30 pm appt for further  refills 45 tablet 0  . VIAGRA 100 MG tablet TAKE 1/2 TABLET BY MOUTH DAILY AS NEEDED 10 tablet 11   No current facility-administered medications for this visit.     BP 130/70   Pulse 71   Ht 6' (1.829 m)   Wt 249 lb (112.9 kg)   BMI 33.77 kg/m  General:  Well developed, well nourished, in no acute distress.  Obese.  Neck:  Thick neck, no JVD. No masses, thyromegaly or abnormal cervical nodes. Lungs:  Clear bilaterally to auscultation and percussion. Heart:  Non-displaced PMI, chest non-tender; regular rate and rhythm, S1, S2 without rubs or gallops. 1/6 early systolic ejection murmur RUSB.  Carotid upstroke normal, no bruit. Pedals normal pulses. No edema.  Abdomen:  Bowel sounds positive; abdomen soft and non-tender without masses, organomegaly, or hernias noted. No hepatosplenomegaly. Extremities:  No clubbing or cyanosis. Neurologic:  Alert and oriented x 3. Psych:  Normal affect.  Assessment/Plan: 1. Bicuspid aortic valve: Mild stenosis on 6/17 echo.  I will arrange for echo in 1 year to assess aortic valve.   2. Ascending aortic aneurysm: 4.5-4.6 cm on 7/17 MRA.  Related to bicuspid aortic valve.  Needs good BP control.  Will get MRA chest in 1 year for followup.  Continue Toprol XL and lisinopril.  3. HTN: BP is well-controlled on current regimen.  4. Hyperlipidemia: Good lipids in 7/17.   Followup in 1 year. Given my transition to CHF clinic, I will have him followup with Dr End.   Loralie Champagne 05/08/2016

## 2016-05-14 ENCOUNTER — Telehealth: Payer: Self-pay

## 2016-05-14 NOTE — Telephone Encounter (Signed)
[facility] left v/m that one touch test strips are not available and pt will need new rx for meter, strips and lancets; left v/m for pt to contact ins co and see what diabetic meter and supplies are covered and to call out office back with that information.

## 2016-06-01 ENCOUNTER — Other Ambulatory Visit: Payer: Self-pay | Admitting: Cardiology

## 2016-06-01 ENCOUNTER — Other Ambulatory Visit: Payer: Self-pay | Admitting: Internal Medicine

## 2016-06-28 ENCOUNTER — Other Ambulatory Visit: Payer: Self-pay | Admitting: Internal Medicine

## 2016-06-30 NOTE — Telephone Encounter (Signed)
Unable to reach pt by phone; spoke with Alyse Low at [place]; pt did get # 90 of test strips in 04/2016. If problem next refill [facility] will cb.nothing further needed at this time.

## 2016-08-25 ENCOUNTER — Other Ambulatory Visit: Payer: Self-pay | Admitting: Internal Medicine

## 2016-08-31 ENCOUNTER — Other Ambulatory Visit: Payer: Self-pay | Admitting: Internal Medicine

## 2016-09-20 ENCOUNTER — Other Ambulatory Visit: Payer: Self-pay | Admitting: Internal Medicine

## 2016-09-22 ENCOUNTER — Telehealth: Payer: Self-pay | Admitting: *Deleted

## 2016-09-22 ENCOUNTER — Encounter: Payer: Self-pay | Admitting: Internal Medicine

## 2016-09-22 ENCOUNTER — Ambulatory Visit (INDEPENDENT_AMBULATORY_CARE_PROVIDER_SITE_OTHER): Payer: 59 | Admitting: Internal Medicine

## 2016-09-22 VITALS — BP 122/80 | HR 68 | Temp 98.3°F | Ht 71.5 in | Wt 248.0 lb

## 2016-09-22 DIAGNOSIS — Z23 Encounter for immunization: Secondary | ICD-10-CM

## 2016-09-22 DIAGNOSIS — E1165 Type 2 diabetes mellitus with hyperglycemia: Secondary | ICD-10-CM | POA: Diagnosis not present

## 2016-09-22 DIAGNOSIS — I10 Essential (primary) hypertension: Secondary | ICD-10-CM

## 2016-09-22 DIAGNOSIS — Z0000 Encounter for general adult medical examination without abnormal findings: Secondary | ICD-10-CM

## 2016-09-22 DIAGNOSIS — IMO0001 Reserved for inherently not codable concepts without codable children: Secondary | ICD-10-CM

## 2016-09-22 LAB — HEMOGLOBIN A1C: Hgb A1c MFr Bld: 9.6 % — ABNORMAL HIGH (ref 4.6–6.5)

## 2016-09-22 NOTE — Assessment & Plan Note (Signed)
Needs to work on lifestyle Will defer PSA Colon due later this year prevnar

## 2016-09-22 NOTE — Progress Notes (Signed)
Pre visit review using our clinic review tool, if applicable. No additional management support is needed unless otherwise documented below in the visit note. 

## 2016-09-22 NOTE — Telephone Encounter (Signed)
It just requires Dr Alla German signature. Placed in his InBox on his desk to sign tomorrow afternoon when he gets in.

## 2016-09-22 NOTE — Addendum Note (Signed)
Addended by: Pilar Grammes on: 09/22/2016 10:12 AM   Modules accepted: Orders

## 2016-09-22 NOTE — Assessment & Plan Note (Signed)
If not better, will start liraglutide/dulaglutide

## 2016-09-22 NOTE — Progress Notes (Signed)
Subjective:    Patient ID: Timothy Bernard, male    DOB: 13-Dec-1955, 61 y.o.   MRN: [id number]  HPI Here for physical  Doing okay other than the sugars Struggling with sugars--- over 220's Lifestyle tough with his 60 hour per week work schedule  No other acute issues  Current Outpatient Prescriptions on File Prior to Visit  Medication Sig Dispense Refill  . aspirin 81 MG tablet Take 81 mg by mouth daily.      . Flaxseed, Linseed, (FLAX SEED OIL) 1000 MG CAPS Take 1 capsule by mouth daily.     [name] glipiZIDE (GLUCOTROL XL) 10 MG 24 hr tablet TAKE 1 TABLET BY MOUTH DAILY WITH BREAKFAST. 90 tablet 3  . Lancets (ONETOUCH ULTRASOFT) lancets 1 each by Other route daily. Dx:250.00 100 each 11  . lisinopril (PRINIVIL,ZESTRIL) 20 MG tablet TAKE 2 TABLETS BY MOUTH  DAILY 180 tablet 1  . metFORMIN (GLUCOPHAGE) 1000 MG tablet TAKE 1 TABLET TWICE A DAY WITH A MEAL 60 tablet 0  . metoprolol succinate (TOPROL-XL) 50 MG 24 hr tablet Take 1 tablet (50 mg total) by mouth daily. 90 tablet 3  . ONE TOUCH ULTRA TEST test strip USE TO TEST BLOOD SUGAR ONCE DAILY DX: E11.65 100 each 4  . rosuvastatin (CRESTOR) 5 MG tablet Take 1 tablet (5 mg total) by mouth daily. 90 tablet 3  . VIAGRA 100 MG tablet TAKE 1/2 TABLET BY MOUTH DAILY AS NEEDED 10 tablet 2   No current facility-administered medications on file prior to visit.     No Known Allergies  Past Medical History:  Diagnosis Date  . Adenomatous colon polyp 09/29/2007   2mm cecal adenomatous polyp 10 mm adenoama, post-polypectomy burn syndrome  . Ascending aortic aneurysm (HCC)    4.8 cm on ech (5/10), 4.6 x 4.6 cm by MRA 6/10 4.6 cm aortic root and ascending aorta by MRA 9/10. MRA chest (3/11) 4.5 ascending aorta.   . Ascending aortic aneurysm ([place] [facility])   . Bicuspid aortic valve    echo (5/10) with bicuspid AoV (upon re-review), mild LV dilation, EF 50-55%, no aortic stenosis, mild AI, moderate ascending aortic dilation (4.8 cm). Biscupid valve  confirmed by MRI. Echo (3/11) bicuspid aortic valve with no AS but mild AI, mild LVH, EF 60%.    [name] HTN (hypertension)   . Hyperlipidemia   . 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 OSA on CPAP     Past Surgical History:  Procedure Laterality Date  . COLONOSCOPY W/ BIOPSIES AND POLYPECTOMY  09/29/2007    Family History  Problem Relation Age of Onset  . Hypertension Mother   . Coronary artery disease Maternal Grandfather   . Colon cancer Neg Hx   . Prostate cancer Neg Hx   . Cancer Father   . Diabetes Mother   . Hypertension Other     maternal side    Social History   Social History  . Marital status: Married    Spouse name: N/A  . Number of children: N/A  . Years of education: N/A   Occupational History  . Surveyor, mining   Social History Main Topics  . Smoking status: Never Smoker  . Smokeless tobacco: Never Used  . Alcohol use No  . Drug use: No  . Sexual activity: Not on file   Other Topics Concern  . Not on file   Social History Narrative   Land.    Married, 1 stepson  Pt signed designated party release form and gives Timothy Bernard, spouse [phone], access to medical records. Can leave msg on answering machine.    Review of Systems  Constitutional: Negative for fatigue and unexpected weight change.       Wears seat belt  HENT: Negative for dental problem, hearing loss and tinnitus.        Keeps up with dentist  Eyes: Negative for visual disturbance.       Overdue for eye exam--going next week  Respiratory: Negative for cough, chest tightness and shortness of breath.   Cardiovascular: Negative for chest pain and leg swelling.  Gastrointestinal: Negative for abdominal pain, blood in stool, constipation, nausea and vomiting.       No heartburn  Endocrine: Negative for polydipsia and polyuria.  Genitourinary: Negative for difficulty urinating, dysuria and urgency.       No sexual problems    Musculoskeletal: Negative for arthralgias, back pain and joint swelling.  Skin: Negative for rash.       No suspicious lesions   Allergic/Immunologic: Negative for environmental allergies and immunocompromised state.  Neurological: Negative for dizziness, syncope, light-headedness and headaches.  Hematological: Negative for adenopathy. Does not bruise/bleed easily.  Psychiatric/Behavioral: Negative for dysphoric mood and sleep disturbance. The patient is not nervous/anxious.        Objective:   Physical Exam  Constitutional: He is oriented to person, place, and time. He appears well-developed and well-nourished. No distress.  HENT:  Head: Normocephalic and atraumatic.  Right Ear: External ear normal.  Left Ear: External ear normal.  Mouth/Throat: Oropharynx is clear and moist. No oropharyngeal exudate.  Eyes: Conjunctivae are normal. Pupils are equal, round, and reactive to light.  Neck: Normal range of motion. Neck supple. No thyromegaly present.  Cardiovascular: Normal rate, regular rhythm, normal heart sounds and intact distal pulses.  Exam reveals no gallop.   No murmur heard. Pulmonary/Chest: Effort normal and breath sounds normal. No respiratory distress. He has no wheezes. He has no rales.  Abdominal: Soft. There is no tenderness.  Musculoskeletal: He exhibits no edema or tenderness.  Lymphadenopathy:    He has no cervical adenopathy.  Neurological: He is alert and oriented to person, place, and time.  Skin: No rash noted. No erythema.  Psychiatric: He has a normal mood and affect. His behavior is normal.          Assessment & Plan:

## 2016-09-22 NOTE — Assessment & Plan Note (Signed)
BP Readings from Last 3 Encounters:  09/22/16 122/80  05/07/16 130/70  03/16/16 114/72   Control acceptable

## 2016-09-22 NOTE — Telephone Encounter (Signed)
PT brought in proof of physical form. Please complete it and mail it to his home. Form placed in prescription tower.

## 2016-09-23 ENCOUNTER — Other Ambulatory Visit: Payer: Self-pay | Admitting: Internal Medicine

## 2016-09-23 MED ORDER — DULAGLUTIDE 1.5 MG/0.5ML ~~LOC~~ SOAJ: 1.5000 mg | SUBCUTANEOUS | 11 refills | Status: DC

## 2016-09-23 MED ORDER — DULAGLUTIDE 0.75 MG/0.5ML ~~LOC~~ SOAJ: 0.7500 mg | SUBCUTANEOUS | 0 refills | Status: DC

## 2016-09-23 NOTE — Telephone Encounter (Signed)
I left a message on patient's voice mail to let him know form is done and form was mailed to patient.

## 2016-09-23 NOTE — Telephone Encounter (Signed)
Form signed No charge 

## 2016-10-13 DIAGNOSIS — H2513 Age-related nuclear cataract, bilateral: Secondary | ICD-10-CM | POA: Diagnosis not present

## 2016-10-13 DIAGNOSIS — H524 Presbyopia: Secondary | ICD-10-CM | POA: Diagnosis not present

## 2016-10-13 DIAGNOSIS — H5213 Myopia, bilateral: Secondary | ICD-10-CM | POA: Diagnosis not present

## 2016-10-13 DIAGNOSIS — E119 Type 2 diabetes mellitus without complications: Secondary | ICD-10-CM | POA: Diagnosis not present

## 2016-10-13 DIAGNOSIS — H35033 Hypertensive retinopathy, bilateral: Secondary | ICD-10-CM | POA: Diagnosis not present

## 2016-10-13 DIAGNOSIS — H52223 Regular astigmatism, bilateral: Secondary | ICD-10-CM | POA: Diagnosis not present

## 2016-10-13 LAB — HM DIABETES EYE EXAM

## 2016-10-20 ENCOUNTER — Other Ambulatory Visit: Payer: Self-pay | Admitting: Internal Medicine

## 2016-11-18 ENCOUNTER — Telehealth: Payer: Self-pay

## 2016-11-18 NOTE — Telephone Encounter (Signed)
That is common, especially when first starting. He should try to eat very light meals. It will often improve over time---have him let us know if he just can't tolerate it

## 2016-11-18 NOTE — Telephone Encounter (Signed)
Timothy Bernard left v/m; pt taking Trulicity and pt developed diarrhea and occasional N&V. Request cb with what should do.

## 2016-11-18 NOTE — Telephone Encounter (Signed)
Spoke to Warren per [employer]. I advised her what Dr Silvio Pate said. She will advise the pt what we said. Michela Pitcher he takes the shot on Sunday evenings but the diarrhea does not come until Wednesday. Fasting BS still 150s to 255 the last 2 weeks.She will have him eat smaller more frequent meals on the day of and the few days after the shot to see if it gets better.

## 2016-12-28 ENCOUNTER — Telehealth: Payer: Self-pay | Admitting: Internal Medicine

## 2016-12-28 DIAGNOSIS — I712 Thoracic aortic aneurysm, without rupture, unspecified: Secondary | ICD-10-CM

## 2016-12-28 DIAGNOSIS — I359 Nonrheumatic aortic valve disorder, unspecified: Secondary | ICD-10-CM

## 2016-12-28 NOTE — Telephone Encounter (Signed)
Per Dr. Claris Gladden most recent note, last echo and MRA were in 01/2016. He recommended repeat studies in 1 year. As I have never met Timothy Bernard I cannot provide further guidance but would tend to agree with Dr. Claris Gladden plan to repeat studies around 01/2017. Thanks.

## 2016-12-28 NOTE — Telephone Encounter (Signed)
Pt former [employer] pt.  Will f/u with Dr. Saunders Revel for yearly appts. Pt had echo in 01/2016 and MRA in 02/2016.  Will route to Dr. Saunders Revel to see if pt needs to repeat these tests this year.

## 2016-12-28 NOTE — Telephone Encounter (Signed)
Spoke with wife (DPR) and made her aware of Dr. Darnelle Bos recommendations.  Advised I would place orders for tests and they would be contacted to scheduled.  Wife verbalized understanding and was appreciative for call.

## 2016-12-28 NOTE — Telephone Encounter (Signed)
Patient wife calling to verify if patient needs echocardiogram and MR Angiogram chest tests. Please call to discuss, thanks.

## 2016-12-31 ENCOUNTER — Telehealth: Payer: Self-pay | Admitting: *Deleted

## 2016-12-31 NOTE — Telephone Encounter (Signed)
Spoke to wife. He will not take it.

## 2016-12-31 NOTE — Telephone Encounter (Signed)
Yes GI side effects are fairly common and if all this is new--it is likely from the medication

## 2016-12-31 NOTE — Telephone Encounter (Signed)
He has had continuous gi issues and malaise since he started Trulicity a few months back and it is getting worse. Should he take his dose on Sunday? Wife is worried.

## 2016-12-31 NOTE — Telephone Encounter (Signed)
Yes--he should stop it. We can discuss plans without it if his symptoms go away (confirming that it is causing the problems)

## 2016-12-31 NOTE — Telephone Encounter (Signed)
Spoke to pts wife who states that pt has been experiencing diarrhea and is now becoming lethargic. Pt has f/u with Dr Silvio Pate on 5/9 and he usually takes his trulicity injection on Sunday. Wife is wanting to know if pt should hold off on injection to see if diarrhea subsides until he sees Dr Silvio Pate

## 2017-01-05 ENCOUNTER — Encounter: Payer: Self-pay | Admitting: Internal Medicine

## 2017-01-05 ENCOUNTER — Ambulatory Visit (HOSPITAL_COMMUNITY): Payer: 59

## 2017-01-05 ENCOUNTER — Ambulatory Visit (INDEPENDENT_AMBULATORY_CARE_PROVIDER_SITE_OTHER): Payer: 59 | Admitting: Internal Medicine

## 2017-01-05 VITALS — BP 136/78 | Temp 97.7°F | Wt 243.5 lb

## 2017-01-05 DIAGNOSIS — E1165 Type 2 diabetes mellitus with hyperglycemia: Secondary | ICD-10-CM

## 2017-01-05 DIAGNOSIS — IMO0001 Reserved for inherently not codable concepts without codable children: Secondary | ICD-10-CM

## 2017-01-05 MED ORDER — SITAGLIPTIN PHOSPHATE 100 MG PO TABS: 100.0000 mg | ORAL_TABLET | Freq: Every day | ORAL | 11 refills | Status: DC

## 2017-01-05 NOTE — Progress Notes (Signed)
Pre visit review using our clinic review tool, if applicable. No additional management support is needed unless otherwise documented below in the visit note. 
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
## 2017-01-05 NOTE — Assessment & Plan Note (Addendum)
Intolerant of the trulicity and it didn't seem to help much Discussed alternatives (Na-glucose cotransport inhibitor vs incretin orally or long acting insulin) Will try [place] Counseled/discussed Rx alternatives for all of 15 minute visit If he doesn't do well with this--would try lantus

## 2017-01-27 ENCOUNTER — Ambulatory Visit (HOSPITAL_COMMUNITY)
Admission: RE | Admit: 2017-01-27 | Discharge: 2017-01-27 | Disposition: A | Discharge status: Home / Self Care | Payer: 59 | Source: Ambulatory Visit | Attending: Internal Medicine | Admitting: Internal Medicine

## 2017-01-27 DIAGNOSIS — I359 Nonrheumatic aortic valve disorder, unspecified: Secondary | ICD-10-CM | POA: Diagnosis not present

## 2017-01-27 DIAGNOSIS — I712 Thoracic aortic aneurysm, without rupture, unspecified: Secondary | ICD-10-CM

## 2017-01-27 LAB — CREATININE, SERUM
CREATININE: 1.21 mg/dL (ref 0.61–1.24)
GFR calc Af Amer: >60 mL/min (ref >60)

## 2017-01-27 MED ORDER — GADOBENATE DIMEGLUMINE 529 MG/ML IV SOLN
20.0000 mL | Freq: Once | INTRAVENOUS | Status: AC | PRN
  Administered 2017-01-27: 20 mL via INTRAVENOUS

## 2017-02-02 ENCOUNTER — Ambulatory Visit (HOSPITAL_COMMUNITY): Payer: 59 | Attending: Cardiology

## 2017-02-02 ENCOUNTER — Other Ambulatory Visit: Payer: Self-pay

## 2017-02-02 DIAGNOSIS — E119 Type 2 diabetes mellitus without complications: Secondary | ICD-10-CM | POA: Insufficient documentation

## 2017-02-02 DIAGNOSIS — I351 Nonrheumatic aortic (valve) insufficiency: Secondary | ICD-10-CM | POA: Insufficient documentation

## 2017-02-02 DIAGNOSIS — E785 Hyperlipidemia, unspecified: Secondary | ICD-10-CM | POA: Diagnosis not present

## 2017-02-02 DIAGNOSIS — G4733 Obstructive sleep apnea (adult) (pediatric): Secondary | ICD-10-CM | POA: Diagnosis not present

## 2017-02-02 DIAGNOSIS — I77810 Thoracic aortic ectasia: Secondary | ICD-10-CM | POA: Diagnosis not present

## 2017-02-02 DIAGNOSIS — I712 Thoracic aortic aneurysm, without rupture, unspecified: Secondary | ICD-10-CM

## 2017-02-02 DIAGNOSIS — I359 Nonrheumatic aortic valve disorder, unspecified: Secondary | ICD-10-CM

## 2017-02-02 DIAGNOSIS — I119 Hypertensive heart disease without heart failure: Secondary | ICD-10-CM | POA: Insufficient documentation

## 2017-03-29 ENCOUNTER — Ambulatory Visit (INDEPENDENT_AMBULATORY_CARE_PROVIDER_SITE_OTHER): Payer: 59 | Admitting: Internal Medicine

## 2017-03-29 ENCOUNTER — Encounter: Payer: Self-pay | Admitting: Internal Medicine

## 2017-03-29 DIAGNOSIS — E1165 Type 2 diabetes mellitus with hyperglycemia: Secondary | ICD-10-CM

## 2017-03-29 DIAGNOSIS — IMO0001 Reserved for inherently not codable concepts without codable children: Secondary | ICD-10-CM

## 2017-03-29 LAB — COMPREHENSIVE METABOLIC PANEL WITH GFR
ALT: 28 U/L (ref 0–53)
AST: 25 U/L (ref 0–37)
Albumin: 4.1 g/dL (ref 3.5–5.2)
Alkaline Phosphatase: 54 U/L (ref 39–117)
BUN: 17 mg/dL (ref 6–23)
CO2: 26 meq/L (ref 19–32)
Calcium: 9.3 mg/dL (ref 8.4–10.5)
Chloride: 104 meq/L (ref 96–112)
Creatinine, Ser: 1.19 mg/dL (ref 0.40–1.50)
GFR: 66.01 mL/min
Glucose, Bld: 132 mg/dL — ABNORMAL HIGH (ref 70–99)
Potassium: 4.3 meq/L (ref 3.5–5.1)
Sodium: 137 meq/L (ref 135–145)
Total Bilirubin: 0.4 mg/dL (ref 0.2–1.2)
Total Protein: 7.3 g/dL (ref 6.0–8.3)

## 2017-03-29 LAB — CBC WITH DIFFERENTIAL/PLATELET
Basophils Absolute: 0 K/uL (ref 0.0–0.1)
Basophils Relative: 0.5 % (ref 0.0–3.0)
Eosinophils Absolute: 0.1 K/uL (ref 0.0–0.7)
Eosinophils Relative: 1.4 % (ref 0.0–5.0)
HCT: 43.6 % (ref 39.0–52.0)
Hemoglobin: 14.4 g/dL (ref 13.0–17.0)
Lymphocytes Relative: 37.1 % (ref 12.0–46.0)
Lymphs Abs: 3.2 K/uL (ref 0.7–4.0)
MCHC: 32.9 g/dL (ref 30.0–36.0)
MCV: 92.7 fl (ref 78.0–100.0)
Monocytes Absolute: 0.6 K/uL (ref 0.1–1.0)
Monocytes Relative: 7.1 % (ref 3.0–12.0)
Neutro Abs: 4.6 K/uL (ref 1.4–7.7)
Neutrophils Relative %: 53.9 % (ref 43.0–77.0)
Platelets: 309 K/uL (ref 150.0–400.0)
RBC: 4.7 Mil/uL (ref 4.22–5.81)
RDW: 14.3 % (ref 11.5–15.5)
WBC: 8.5 K/uL (ref 4.0–10.5)

## 2017-03-29 LAB — LIPID PANEL
Cholesterol: 113 mg/dL (ref 0–200)
HDL: 28.8 mg/dL — ABNORMAL LOW
LDL Cholesterol: 55 mg/dL (ref 0–99)
NonHDL: 83.73
Total CHOL/HDL Ratio: 4
Triglycerides: 145 mg/dL (ref 0.0–149.0)
VLDL: 29 mg/dL (ref 0.0–40.0)

## 2017-03-29 LAB — HEMOGLOBIN A1C: Hgb A1c MFr Bld: 9 % — ABNORMAL HIGH (ref 4.6–6.5)

## 2017-03-29 LAB — HM DIABETES FOOT EXAM
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
## 2017-03-29 NOTE — Assessment & Plan Note (Signed)
Has made significant lifestyle changes --less carbs, lost weight Never started the januvia A1c goal is under 8%---for now, will start the januvia if still over 8.5% (since he is working on the lifestyle)

## 2017-03-29 NOTE — Patient Instructions (Signed)
Please have your eye doctor send a record of your last visit.

## 2017-04-01 ENCOUNTER — Other Ambulatory Visit: Payer: Self-pay

## 2017-04-01 NOTE — Telephone Encounter (Signed)
Left a message on vm to find out which pharmacy the [place] needed to go.

## 2017-04-05 ENCOUNTER — Encounter: Payer: Self-pay | Admitting: Internal Medicine

## 2017-04-05 NOTE — Telephone Encounter (Signed)
Spoke to pt's wife, Blanch Media, per DPR. She said pt did want to start the [place] and [facility] will [facility].

## 2017-05-06 ENCOUNTER — Ambulatory Visit (INDEPENDENT_AMBULATORY_CARE_PROVIDER_SITE_OTHER): Payer: 59 | Admitting: Internal Medicine

## 2017-05-06 ENCOUNTER — Encounter: Payer: Self-pay | Admitting: Internal Medicine

## 2017-05-06 VITALS — BP 124/62 | Ht 71.5 in | Wt 242.0 lb

## 2017-05-06 DIAGNOSIS — Q231 Congenital insufficiency of aortic valve: Secondary | ICD-10-CM

## 2017-05-06 DIAGNOSIS — I712 Thoracic aortic aneurysm, without rupture, unspecified: Secondary | ICD-10-CM

## 2017-05-06 DIAGNOSIS — I10 Essential (primary) hypertension: Secondary | ICD-10-CM | POA: Diagnosis not present

## 2017-05-06 DIAGNOSIS — E785 Hyperlipidemia, unspecified: Secondary | ICD-10-CM

## 2017-05-06 NOTE — Patient Instructions (Signed)
Medication Instructions:  Your physician recommends that you continue on your current medications as directed. Please refer to the Current Medication list given to you today.   Labwork: None ordered  Testing/Procedures: Your physician has requested that you have an echocardiogram in June 2019. Echocardiography is a painless test that uses sound waves to create images of your heart. It provides your doctor with information about the size and shape of your heart and how well your heart's chambers and valves are working. This procedure takes approximately one hour. There are no restrictions for this procedure.  Your physician has requested that you have an MRA of your chest in June 2019.   Follow-Up: Your physician wants you to follow-up in: July 2019 with Dr. Saunders Revel. You will receive a reminder letter in the mail two months in advance. If you don't receive a letter, please call our office to schedule the follow-up appointment.   Any Other Special Instructions Will Be Listed Below (If Applicable).     If you need a refill on your cardiac medications before your next appointment, please call your pharmacy.
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
## 2017-05-07 ENCOUNTER — Encounter: Payer: Self-pay | Admitting: Internal Medicine

## 2017-05-07 DIAGNOSIS — Q231 Congenital insufficiency of aortic valve: Secondary | ICD-10-CM | POA: Insufficient documentation

## 2017-06-05 ENCOUNTER — Other Ambulatory Visit: Payer: Self-pay | Admitting: Cardiology

## 2017-06-08 ENCOUNTER — Telehealth: Payer: Self-pay

## 2017-06-08 NOTE — Telephone Encounter (Signed)
Mrs Osuna left v/m (DPR signed) pt had been getting name brand viagra; now ins is filling with generic brand and pt having side effects of generic with back pain and nausea. [facility]. Mrs Blick request cb.

## 2017-06-09 MED ORDER — VIAGRA 100 MG PO TABS: ORAL_TABLET | ORAL | 2 refills | Status: DC

## 2017-06-09 NOTE — Telephone Encounter (Signed)
Okay to change it to brand---notify pharmacy

## 2017-06-09 NOTE — Telephone Encounter (Signed)
Sent new rx as DAW. Left message on vm per dpr.

## 2017-07-17 ENCOUNTER — Other Ambulatory Visit: Payer: Self-pay | Admitting: Internal Medicine

## 2017-08-22 ENCOUNTER — Other Ambulatory Visit: Payer: Self-pay | Admitting: Internal Medicine

## 2017-08-26 ENCOUNTER — Encounter: Payer: Self-pay | Admitting: Internal Medicine

## 2017-09-03 ENCOUNTER — Other Ambulatory Visit: Payer: Self-pay | Admitting: Internal Medicine

## 2017-09-07 ENCOUNTER — Ambulatory Visit: Payer: 59 | Admitting: Internal Medicine

## 2017-09-07 ENCOUNTER — Encounter: Payer: Self-pay | Admitting: Internal Medicine

## 2017-09-07 ENCOUNTER — Other Ambulatory Visit: Payer: Self-pay | Admitting: Internal Medicine

## 2017-09-07 VITALS — BP 134/72 | HR 73 | Temp 98.3°F | Wt 239.5 lb

## 2017-09-07 DIAGNOSIS — IMO0001 Reserved for inherently not codable concepts without codable children: Secondary | ICD-10-CM

## 2017-09-07 DIAGNOSIS — E785 Hyperlipidemia, unspecified: Secondary | ICD-10-CM

## 2017-09-07 DIAGNOSIS — I10 Essential (primary) hypertension: Secondary | ICD-10-CM

## 2017-09-07 DIAGNOSIS — Z125 Encounter for screening for malignant neoplasm of prostate: Secondary | ICD-10-CM | POA: Diagnosis not present

## 2017-09-07 DIAGNOSIS — E1165 Type 2 diabetes mellitus with hyperglycemia: Secondary | ICD-10-CM

## 2017-09-07 DIAGNOSIS — N529 Male erectile dysfunction, unspecified: Secondary | ICD-10-CM | POA: Diagnosis not present

## 2017-09-07 LAB — HEMOGLOBIN A1C: HEMOGLOBIN A1C: 8.7 % — AB (ref 4.6–6.5)

## 2017-09-07 LAB — PSA: PSA: 2.27 ng/mL (ref 0.10–4.00)

## 2017-09-07 MED ORDER — SILDENAFIL CITRATE 100 MG PO TABS: ORAL_TABLET | ORAL | 11 refills | Status: DC
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
## 2017-09-07 NOTE — Assessment & Plan Note (Signed)
Hasn't had success--but only tried 50mg  Asked him to increase to 100mg 

## 2017-09-07 NOTE — Assessment & Plan Note (Signed)
Ongoing control issues He prefers no more medications despite my advice Discussed long acting insulin instead of the januvia--he prefers the pills Will only consider starting januvia if A1c over 9% again No complications

## 2017-09-07 NOTE — Assessment & Plan Note (Signed)
BP Readings from Last 3 Encounters:  09/07/17 134/72  05/06/17 124/62  03/29/17 106/72   Still with good control

## 2017-09-07 NOTE — Assessment & Plan Note (Signed)
No problems with statin Lab Results  Component Value Date   LDLCALC 55 03/29/2017

## 2017-09-07 NOTE — Progress Notes (Signed)
It says we sent in December but we will call

## 2017-09-08 ENCOUNTER — Telehealth: Payer: Self-pay | Admitting: Internal Medicine

## 2017-09-08 NOTE — Telephone Encounter (Signed)
Left message for patient to return my call.

## 2017-09-08 NOTE — Telephone Encounter (Signed)
-----   Message from Gatha Mayer, MD sent at 09/07/2017  4:47 PM EST ----- Regarding: needs recall colon appt Told PCP he did not get a letter yet - it says sent 12/28 so maybe just has not arrived  Please call and get him appointment for recall colonoscopy and previsit  THANKS  CEG

## 2017-09-11 ENCOUNTER — Other Ambulatory Visit: Payer: Self-pay | Admitting: Internal Medicine

## 2017-09-16 ENCOUNTER — Encounter: Payer: Self-pay | Admitting: Internal Medicine

## 2017-09-27 ENCOUNTER — Encounter: Payer: 59 | Admitting: Internal Medicine

## 2017-10-15 ENCOUNTER — Other Ambulatory Visit: Payer: Self-pay | Admitting: Internal Medicine

## 2017-10-19 DIAGNOSIS — H2513 Age-related nuclear cataract, bilateral: Secondary | ICD-10-CM | POA: Diagnosis not present

## 2017-10-19 DIAGNOSIS — E119 Type 2 diabetes mellitus without complications: Secondary | ICD-10-CM | POA: Diagnosis not present

## 2017-10-19 DIAGNOSIS — H35033 Hypertensive retinopathy, bilateral: Secondary | ICD-10-CM | POA: Diagnosis not present

## 2017-10-19 LAB — HM DIABETES EYE EXAM

## 2017-10-31 ENCOUNTER — Encounter: Payer: Self-pay | Admitting: *Deleted

## 2017-10-31 NOTE — Progress Notes (Signed)
Gave last Echo 02/02/2017 results to [facility] Monday CRNA for review before colonoscopy. Josh ok'ed the echo results. [place] for [employer] per [employer].

## 2017-11-08 DIAGNOSIS — D492 Neoplasm of unspecified behavior of bone, soft tissue, and skin: Secondary | ICD-10-CM | POA: Diagnosis not present

## 2017-11-08 DIAGNOSIS — L820 Inflamed seborrheic keratosis: Secondary | ICD-10-CM | POA: Diagnosis not present

## 2017-11-14 ENCOUNTER — Ambulatory Visit (AMBULATORY_SURGERY_CENTER): Payer: Self-pay

## 2017-11-14 ENCOUNTER — Other Ambulatory Visit: Payer: Self-pay

## 2017-11-14 VITALS — Ht 72.0 in | Wt 245.0 lb

## 2017-11-14 DIAGNOSIS — Z86010 Personal history of colonic polyps: Secondary | ICD-10-CM

## 2017-11-14 NOTE — Progress Notes (Signed)
Denies allergies to eggs or soy products. Denies complication of anesthesia or sedation. Denies use of weight loss medication. Denies use of O2.   Emmi instructions declined.  

## 2017-11-15 ENCOUNTER — Encounter: Payer: Self-pay | Admitting: Internal Medicine

## 2017-11-28 ENCOUNTER — Ambulatory Visit (AMBULATORY_SURGERY_CENTER): Payer: 59 | Admitting: Internal Medicine

## 2017-11-28 ENCOUNTER — Other Ambulatory Visit: Payer: Self-pay

## 2017-11-28 ENCOUNTER — Encounter: Payer: Self-pay | Admitting: Internal Medicine

## 2017-11-28 VITALS — BP 115/68 | HR 64 | Temp 97.8°F | Resp 16 | Ht 72.0 in | Wt 245.0 lb

## 2017-11-28 DIAGNOSIS — Z86010 Personal history of colonic polyps: Secondary | ICD-10-CM

## 2017-11-28 DIAGNOSIS — D122 Benign neoplasm of ascending colon: Secondary | ICD-10-CM

## 2017-11-28 DIAGNOSIS — E119 Type 2 diabetes mellitus without complications: Secondary | ICD-10-CM | POA: Diagnosis not present

## 2017-11-28 MED ORDER — SODIUM CHLORIDE 0.9 % IV SOLN
500.0000 mL | Freq: Once | INTRAVENOUS | Status: DC
Start: 2017-11-28 — End: 2018-01-16

## 2017-11-28 NOTE — Progress Notes (Signed)
Pt's states no medical or surgical changes since previsit or office visit. 

## 2017-11-28 NOTE — Patient Instructions (Addendum)
I found and removed one tiny polyp today.  Your next routine colonoscopy should be in 5 years - 2024.  I appreciate the opportunity to care for you. Gatha Mayer, MD, FACG  YOU HAD AN ENDOSCOPIC PROCEDURE TODAY AT [facility]:   Refer to the procedure report that was given to you for any specific questions about what was found during the examination.  If the procedure report does not answer your questions, please call your gastroenterologist to clarify.  If you requested that your care partner not be given the details of your procedure findings, then the procedure report has been included in a sealed envelope for you to review at your convenience later.  YOU SHOULD EXPECT: Some feelings of bloating in the abdomen. Passage of more gas than usual.  Walking can help get rid of the air that was put into your GI tract during the procedure and reduce the bloating. If you had a lower endoscopy (such as a colonoscopy or flexible sigmoidoscopy) you may notice spotting of blood in your stool or on the toilet paper. If you underwent a bowel prep for your procedure, you may not have a normal bowel movement for a few days.  Please Note:  You might notice some irritation and congestion in your nose or some drainage.  This is from the oxygen used during your procedure.  There is no need for concern and it should clear up in a day or so.  SYMPTOMS TO REPORT IMMEDIATELY:   Following lower endoscopy (colonoscopy or flexible sigmoidoscopy):  Excessive amounts of blood in the stool  Significant tenderness or worsening of abdominal pains  Swelling of the abdomen that is new, acute  Fever of 100F or higher  For urgent or emergent issues, a gastroenterologist can be reached at any hour by calling [phone].   DIET:  We do recommend a small meal at first, but then you may proceed to your regular diet.  Drink plenty of fluids but you should avoid alcoholic beverages for 24  hours.  MEDICATIONS: Continue present medications.  Please see handouts given to you by your recovery nurse.  ACTIVITY:  You should plan to take it easy for the rest of today and you should NOT DRIVE or use heavy machinery until tomorrow (because of the sedation medicines used during the test).    FOLLOW UP: Our staff will call the number listed on your records the next business day following your procedure to check on you and address any questions or concerns that you may have regarding the information given to you following your procedure. If we do not reach you, we will leave a message.  However, if you are feeling well and you are not experiencing any problems, there is no need to return our call.  We will assume that you have returned to your regular daily activities without incident.  If any biopsies were taken you will be contacted by phone or by letter within the next 1-3 weeks.  Please call us at [phone] if you have not heard about the biopsies in 3 weeks.   Thank you for allowing [place] to provide for your healthcare needs today.   SIGNATURES/CONFIDENTIALITY: You and/or your care partner have signed paperwork which will be entered into your electronic medical record.  These signatures attest to the fact that that the information above on your After Visit Summary has been reviewed and is understood.  Full responsibility of the confidentiality of this  discharge information lies with you and/or your care-partner. 

## 2017-11-28 NOTE — Op Note (Signed)
[place] Patient Name: Timothy Bernard Procedure Date: 11/28/2017 9:37 AM MRN: [id number] Endoscopist: Gatha Mayer , MD Age: 62 Referring MD:  Date of Birth: Oct 25, 1955 Gender: Male Account #: [id number] Procedure:                Colonoscopy Indications:              Surveillance: Personal history of adenomatous                            polyps on last colonoscopy > 3 years ago Medicines:                Propofol per Anesthesia, Monitored Anesthesia Care Procedure:                Pre-Anesthesia Assessment:                           - Prior to the procedure, a History and Physical                            was performed, and patient medications and                            allergies were reviewed. The patient's tolerance of                            previous anesthesia was also reviewed. The risks                            and benefits of the procedure and the sedation                            options and risks were discussed with the patient.                            All questions were answered, and informed consent                            was obtained. Prior Anticoagulants: The patient has                            taken no previous anticoagulant or antiplatelet                            agents. ASA Grade Assessment: III - A patient with                            severe systemic disease. After reviewing the risks                            and benefits, the patient was deemed in                            satisfactory condition to undergo the procedure.  After obtaining informed consent, the colonoscope                            was passed under direct vision. Throughout the                            procedure, the patient's blood pressure, pulse, and                            oxygen saturations were monitored continuously. The                            Colonoscope was introduced through the anus and   advanced to the the cecum, identified by                            appendiceal orifice and ileocecal valve. The                            colonoscopy was performed without difficulty. The                            patient tolerated the procedure well. The quality                            of the bowel preparation was good. The ileocecal                            valve, appendiceal orifice, and rectum were                            photographed. The bowel preparation used was                            Miralax. Scope In: 9:46:35 AM Scope Out: 10:03:37 AM Scope Withdrawal Time: 0 hours 14 minutes 29 seconds  Total Procedure Duration: 0 hours 17 minutes 2 seconds  Findings:                 The perianal and digital rectal examinations were                            normal. Pertinent negatives include normal prostate                            (size, shape, and consistency).                           A diminutive polyp was found in the ascending                            colon. The polyp was sessile. The polyp was removed                            with a cold snare. Resection and retrieval were  complete. Verification of patient identification                            for the specimen was done. Estimated blood loss was                            minimal.                           The exam was otherwise without abnormality on                            direct and retroflexion views. Complications:            No immediate complications. Estimated Blood Loss:     Estimated blood loss was minimal. Impression:               - One diminutive polyp in the ascending colon,                            removed with a cold snare. Resected and retrieved.                           - The examination was otherwise normal on direct                            and retroflexion views. Recommendation:           - Patient has a contact number available for                             emergencies. The signs and symptoms of potential                            delayed complications were discussed with the                            patient. Return to normal activities tomorrow.                            Written discharge instructions were provided to the                            patient.                           - Resume previous diet.                           - Continue present medications.                           - Repeat colonoscopy in 5 years for surveillance.                            Hx adenomas 2 in 2009 and 6 in 2015 Gatha Mayer, MD 11/28/2017 10:09:42  AM This report has been signed electronically.

## 2017-11-28 NOTE — Progress Notes (Signed)
Report given to PACU, vss 

## 2017-11-28 NOTE — Progress Notes (Signed)
Called to room to assist during endoscopic procedure.  Patient ID and intended procedure confirmed with present staff. Received instructions for my participation in the procedure from the performing physician.  

## 2017-11-29 ENCOUNTER — Telehealth: Payer: Self-pay

## 2017-11-29 NOTE — Telephone Encounter (Signed)
  Follow up Call-  Call back number 11/28/2017  Post procedure Call Back phone  # [phone]  Permission to leave phone message Yes  Some recent data might be hidden     Patient questions:  Do you have a fever, pain , or abdominal swelling? No. Pain Score  0 *  Have you tolerated food without any problems? Yes.    Have you been able to return to your normal activities? Yes.    Do you have any questions about your discharge instructions: Diet   No. Medications  No. Follow up visit  No.  Do you have questions or concerns about your Care? No.  Actions: * If pain score is 4 or above: No action needed, pain <4.

## 2017-12-01 ENCOUNTER — Encounter: Payer: Self-pay | Admitting: Internal Medicine

## 2017-12-01 NOTE — Progress Notes (Signed)
Correction 1 ssp not 2

## 2017-12-01 NOTE — Progress Notes (Signed)
2 ssp's Recall 2024 My Chart letter

## 2017-12-21 ENCOUNTER — Telehealth: Payer: Self-pay | Admitting: Internal Medicine

## 2017-12-21 NOTE — Telephone Encounter (Signed)
Pt has recall in system to have a MRA before his appt in July but no order in system. Wife was inquiring about this. Please advise.

## 2017-12-21 NOTE — Telephone Encounter (Signed)
Spoke with patient's wife concerning a MRA-Chest which should be scheduled in June along with an Echo the same month.   I found the Echo scheduled 02/06/18 and talked to scheduling about the MRA-Chest. They are now scheduling and will contact the patient. I phoned patient and gave them an update and they verbalized understanding.

## 2018-01-16 ENCOUNTER — Encounter: Payer: Self-pay | Admitting: Internal Medicine

## 2018-01-16 ENCOUNTER — Ambulatory Visit: Payer: 59 | Admitting: Internal Medicine

## 2018-01-16 VITALS — BP 118/74 | HR 81 | Temp 99.2°F | Ht 71.5 in | Wt 231.0 lb

## 2018-01-16 DIAGNOSIS — M7501 Adhesive capsulitis of right shoulder: Secondary | ICD-10-CM | POA: Diagnosis not present

## 2018-01-16 DIAGNOSIS — J069 Acute upper respiratory infection, unspecified: Secondary | ICD-10-CM | POA: Diagnosis not present

## 2018-01-16 DIAGNOSIS — M7500 Adhesive capsulitis of unspecified shoulder: Secondary | ICD-10-CM | POA: Insufficient documentation

## 2018-01-16 NOTE — Assessment & Plan Note (Signed)
Exam benign Discussed symptom relief Would consider empiric antibiotic at the end of the week if worsening
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
## 2018-01-16 NOTE — Assessment & Plan Note (Signed)
Unclear if related to diabetes Will set up with ortho Discussed heat and ROM

## 2018-01-18 DIAGNOSIS — M7501 Adhesive capsulitis of right shoulder: Secondary | ICD-10-CM | POA: Diagnosis not present

## 2018-02-06 ENCOUNTER — Ambulatory Visit (HOSPITAL_BASED_OUTPATIENT_CLINIC_OR_DEPARTMENT_OTHER): Payer: 59

## 2018-02-06 ENCOUNTER — Ambulatory Visit (HOSPITAL_COMMUNITY)
Admission: RE | Admit: 2018-02-06 | Discharge: 2018-02-06 | Disposition: A | Discharge status: Home / Self Care | Payer: 59 | Source: Ambulatory Visit | Attending: Internal Medicine | Admitting: Internal Medicine

## 2018-02-06 ENCOUNTER — Other Ambulatory Visit: Payer: Self-pay

## 2018-02-06 DIAGNOSIS — Q231 Congenital insufficiency of aortic valve: Secondary | ICD-10-CM | POA: Diagnosis not present

## 2018-02-06 DIAGNOSIS — I712 Thoracic aortic aneurysm, without rupture, unspecified: Secondary | ICD-10-CM

## 2018-02-06 DIAGNOSIS — E119 Type 2 diabetes mellitus without complications: Secondary | ICD-10-CM | POA: Insufficient documentation

## 2018-02-06 DIAGNOSIS — I10 Essential (primary) hypertension: Secondary | ICD-10-CM | POA: Diagnosis not present

## 2018-02-06 DIAGNOSIS — E785 Hyperlipidemia, unspecified: Secondary | ICD-10-CM | POA: Diagnosis not present

## 2018-02-06 DIAGNOSIS — Z8679 Personal history of other diseases of the circulatory system: Secondary | ICD-10-CM | POA: Diagnosis not present

## 2018-02-06 LAB — CREATININE, SERUM: CREATININE: 1.14 mg/dL (ref 0.61–1.24)

## 2018-02-06 MED ORDER — GADOBENATE DIMEGLUMINE 529 MG/ML IV SOLN
20.0000 mL | Freq: Once | INTRAVENOUS | Status: AC | PRN
  Administered 2018-02-06: 20 mL via INTRAVENOUS

## 2018-02-15 DIAGNOSIS — M7501 Adhesive capsulitis of right shoulder: Secondary | ICD-10-CM | POA: Diagnosis not present

## 2018-03-07 ENCOUNTER — Ambulatory Visit: Payer: 59 | Admitting: Internal Medicine

## 2018-03-07 ENCOUNTER — Encounter: Payer: Self-pay | Admitting: Internal Medicine

## 2018-03-07 VITALS — BP 132/84 | HR 72 | Temp 98.5°F | Ht 72.0 in | Wt 232.0 lb

## 2018-03-07 DIAGNOSIS — I10 Essential (primary) hypertension: Secondary | ICD-10-CM

## 2018-03-07 DIAGNOSIS — IMO0001 Reserved for inherently not codable concepts without codable children: Secondary | ICD-10-CM

## 2018-03-07 DIAGNOSIS — Z0000 Encounter for general adult medical examination without abnormal findings: Secondary | ICD-10-CM

## 2018-03-07 DIAGNOSIS — E1165 Type 2 diabetes mellitus with hyperglycemia: Secondary | ICD-10-CM

## 2018-03-07 DIAGNOSIS — G4733 Obstructive sleep apnea (adult) (pediatric): Secondary | ICD-10-CM

## 2018-03-07 LAB — HM DIABETES FOOT EXAM

## 2018-03-07 LAB — COMPREHENSIVE METABOLIC PANEL
ALT: 22 U/L (ref 0–53)
AST: 20 U/L (ref 0–37)
Albumin: 4.1 g/dL (ref 3.5–5.2)
Alkaline Phosphatase: 54 U/L (ref 39–117)
BUN: 14 mg/dL (ref 6–23)
CHLORIDE: 104 meq/L (ref 96–112)
CO2: 27 mEq/L (ref 19–32)
Calcium: 9.2 mg/dL (ref 8.4–10.5)
Creatinine, Ser: 1.13 mg/dL (ref 0.40–1.50)
GFR: 69.86 mL/min (ref >60.00)
GLUCOSE: 152 mg/dL — AB (ref 70–99)
POTASSIUM: 4.3 meq/L (ref 3.5–5.1)
SODIUM: 137 meq/L (ref 135–145)
TOTAL PROTEIN: 7.5 g/dL (ref 6.0–8.3)
Total Bilirubin: 0.5 mg/dL (ref 0.2–1.2)

## 2018-03-07 LAB — CBC
HCT: 41.9 % (ref 39.0–52.0)
HEMOGLOBIN: 14.1 g/dL (ref 13.0–17.0)
MCHC: 33.7 g/dL (ref 30.0–36.0)
MCV: 91.5 fl (ref 78.0–100.0)
PLATELETS: 317 10*3/uL (ref 150.0–400.0)
RBC: 4.59 Mil/uL (ref 4.22–5.81)
RDW: 14.3 % (ref 11.5–15.5)
WBC: 8.5 10*3/uL (ref 4.0–10.5)

## 2018-03-07 LAB — LIPID PANEL
CHOLESTEROL: 112 mg/dL (ref 0–200)
HDL: 32.8 mg/dL — AB (ref >39.00)
LDL CALC: 46 mg/dL (ref 0–99)
NONHDL: 78.73
Total CHOL/HDL Ratio: 3
Triglycerides: 164 mg/dL — ABNORMAL HIGH (ref 0.0–149.0)
VLDL: 32.8 mg/dL (ref 0.0–40.0)

## 2018-03-07 LAB — HEMOGLOBIN A1C: Hgb A1c MFr Bld: 8.2 % — ABNORMAL HIGH (ref 4.6–6.5)

## 2018-03-07 NOTE — Assessment & Plan Note (Signed)
No longer using CPAP. 

## 2018-03-07 NOTE — Assessment & Plan Note (Signed)
Healthy Working on fitness Yearly flu vaccine Colon due 2024 Defer PSA

## 2018-03-07 NOTE — Assessment & Plan Note (Signed)
If A1c still well over 8%---will start dulaglutide

## 2018-03-07 NOTE — Assessment & Plan Note (Signed)
BP Readings from Last 3 Encounters:  03/07/18 132/84  01/16/18 118/74  11/28/17 115/68   Good control On ACEI
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
## 2018-03-10 ENCOUNTER — Encounter: Payer: Self-pay | Admitting: Internal Medicine

## 2018-03-17 ENCOUNTER — Ambulatory Visit: Payer: 59 | Admitting: Internal Medicine

## 2018-03-17 ENCOUNTER — Encounter: Payer: Self-pay | Admitting: Internal Medicine

## 2018-03-17 VITALS — BP 120/72 | HR 72 | Ht 73.0 in | Wt 239.6 lb

## 2018-03-17 DIAGNOSIS — Q231 Congenital insufficiency of aortic valve: Secondary | ICD-10-CM | POA: Diagnosis not present

## 2018-03-17 DIAGNOSIS — I10 Essential (primary) hypertension: Secondary | ICD-10-CM

## 2018-03-17 DIAGNOSIS — E119 Type 2 diabetes mellitus without complications: Secondary | ICD-10-CM | POA: Diagnosis not present

## 2018-03-17 DIAGNOSIS — I712 Thoracic aortic aneurysm, without rupture, unspecified: Secondary | ICD-10-CM

## 2018-03-17 DIAGNOSIS — I350 Nonrheumatic aortic (valve) stenosis: Secondary | ICD-10-CM

## 2018-03-17 DIAGNOSIS — E785 Hyperlipidemia, unspecified: Secondary | ICD-10-CM

## 2018-03-17 NOTE — Patient Instructions (Addendum)
Medication Instructions:  Your physician recommends that you continue on your current medications as directed. Please refer to the Current Medication list given to you today.  -- If you need a refill on your cardiac medications before your next appointment, please call your pharmacy. --  Labwork: None ordered  Testing/Procedures: MRA CHEST 1 YEAR 2020  Follow-Up: Your physician wants you to follow-up in: 1 year with Dr. Saunders Revel [name]   You will receive a reminder letter in the mail two months in advance. If you don't receive a letter, please call our office to schedule the follow-up appointment.  Thank you for choosing [facility]!!    Any Other Special Instructions Will Be Listed Below (If Applicable).

## 2018-03-17 NOTE — Progress Notes (Signed)
Follow-up Outpatient Visit Date: 03/17/2018  Primary Care Provider: Venia Carbon, MD [place] [address]  Chief Complaint: Follow-up bicuspid aortic valve and thoracic aortic aneurysm.  HPI:  Timothy Bernard is a 62 y.o. year-old male with history of bicuspid aortic valve, thoracic aortic aneurysm, hypertension, hyperlipidemia, type 2 diabetes mellitus, and obstructive sleep apnea, who presents for follow-up of valvular heart disease and TAA.  I last saw him in 04/2017, at which time he was feeling well other than a singe episode of lightheadedness that occurred while climbing stairs.  Today, [name] reports feeling well.  He denies chest pain, shortness of breath, palpitations, lightheadedness, anemia.  He notes that his blood sugars have been suboptimally controlled, though they are improving.  He is not exercising regularly but is thinking about beginning a walking program.  Home blood pressure is usually around 120/70.  --------------------------------------------------------------------------------------------------  Past Medical History:  Diagnosis Date  . Adenomatous colon polyp 09/29/2007   72mm cecal adenomatous polyp 10 mm adenoama, post-polypectomy burn syndrome  . Ascending aortic aneurysm (HCC)    4.8 cm on ech (5/10), 4.6 x 4.6 cm by MRA 6/10 4.6 cm aortic root and ascending aorta by MRA 9/10. MRA chest (3/11) 4.5 ascending aorta. Stable by MRA 6/18  . Bicuspid aortic valve    echo (5/10) with bicuspid AoV (upon re-review), mild LV dilation, EF 50-55%, no aortic stenosis, mild AI, moderate ascending aortic dilation (4.8 cm). 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 Echo (3/11) bicuspid aortic valve with no AS but mild AI, mild LVH, EF 60%.    . Blood transfusion without reported diagnosis   . Cataract   . HTN (hypertension)   . Hyperlipidemia   . 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 OSA on CPAP    Past Surgical History:    Procedure Laterality Date  . COLONOSCOPY W/ BIOPSIES AND POLYPECTOMY  09/29/2007  . Mole removed      Current Meds  Medication Sig  . aspirin 81 MG tablet Take 81 mg by mouth daily.    . Flaxseed, Linseed, (FLAX SEED OIL) 1000 MG CAPS Take 1 capsule by mouth daily.   [name] glipiZIDE (GLUCOTROL XL) 10 MG 24 hr tablet TAKE 1 TABLET BY MOUTH DAILY WITH BREAKFAST.  [name] Lancets (ONETOUCH ULTRASOFT) lancets 1 each by Other route daily. Dx:250.00  . lisinopril (PRINIVIL,ZESTRIL) 20 MG tablet TAKE 2 TABLETS BY MOUTH  DAILY  . metFORMIN (GLUCOPHAGE) 1000 MG tablet TAKE 1 TABLET TWICE A DAY WITH A MEAL  . metoprolol succinate (TOPROL-XL) 50 MG 24 hr tablet TAKE 1 TABLET BY MOUTH  DAILY  . ONE TOUCH ULTRA TEST test strip USE TO TEST BLOOD SUGAR ONCE DAILY E11.65  . rosuvastatin (CRESTOR) 5 MG tablet TAKE 1 TABLET BY MOUTH  DAILY  . sildenafil (VIAGRA) 100 MG tablet TAKE 1 TABLET BY MOUTH DAILY AS NEEDED    Allergies: Patient has no known allergies.  Social History   Tobacco Use  . Smoking status: Never Smoker  . Smokeless tobacco: Never Used  Substance Use Topics  . Alcohol use: No    Alcohol/week: 0.0 oz  . Drug use: No    Family History  Problem Relation Age of Onset  . Hypertension Mother   . Diabetes Mother   . Cancer Father   . Coronary artery disease Maternal Grandfather   . Hypertension Other        maternal side  . Colon cancer Neg Hx   .  Prostate cancer Neg Hx   . Esophageal cancer Neg Hx   . Liver cancer Neg Hx   . Pancreatic cancer Neg Hx   . Stomach cancer Neg Hx   . Rectal cancer Neg Hx     Review of Systems: A 12-system review of systems was performed and was negative except as noted in the HPI.  --------------------------------------------------------------------------------------------------  Physical Exam: BP 120/72   Pulse 72   Ht 6\' 1"  (1.854 m)   Wt 239 lb 9.6 oz (108.7 kg)   SpO2 94%   BMI 31.61 kg/m   General: NAD.  Accompanied by his wife. HEENT:  No conjunctival pallor or scleral icterus. Moist mucous membranes.  OP clear. Neck: Supple without lymphadenopathy, thyromegaly, JVD, or HJR. Lungs: Normal work of breathing. Clear to auscultation bilaterally without wheezes or crackles. Heart: Regular rate and rhythm with 2/6 crescendo-decrescendo systolic murmur loudest at the right upper sternal border.  No rubs or gallops.. Abd: Bowel sounds present. Soft, NT/ND without hepatosplenomegaly Ext: No lower extremity edema. Skin: Warm and dry without rash.  EKG: Normal sinus rhythm with left axis deviation and incomplete left bundle branch block.  No significant change since 05/06/2017.  Echo (02/06/18): Normal LV size and wall thickness.  LVEF 60-65% with grade 2 diastolic dysfunction.  Moderately thickened/calcified aortic valve without significant stenosis (mean gradient 12 mmHg).  Mild LAE.  Normal RV size and function.  Moderately dilated aorta (ascending aorta up to 4.6 cm).  MRA chest (02/06/18): Relatively unchanged diameter of the ascending aorta, measuring 4.5 cm.  Lab Results  Component Value Date   WBC 8.5 03/07/2018   HGB 14.1 03/07/2018   HCT 41.9 03/07/2018   MCV 91.5 03/07/2018   PLT 317.0 03/07/2018    Lab Results  Component Value Date   NA 137 03/07/2018   K 4.3 03/07/2018   CL 104 03/07/2018   CO2 27 03/07/2018   BUN 14 03/07/2018   CREATININE 1.13 03/07/2018   GLUCOSE 152 (H) 03/07/2018   ALT 22 03/07/2018    Lab Results  Component Value Date   CHOL 112 03/07/2018   HDL 32.80 (L) 03/07/2018   LDLCALC 46 03/07/2018   LDLDIRECT 159.0 08/09/2007   TRIG 164.0 (H) 03/07/2018   CHOLHDL 3 03/07/2018    --------------------------------------------------------------------------------------------------  ASSESSMENT AND PLAN: Aortic stenosis with bicuspid aortic valve No symptoms to suggest worsening aortic stenosis.  Echo last month showed borderline mild aortic stenosis.  We will continue clinical follow-up.   Unless he has new symptoms, I think it is okay to defer repeating an echo until 2 years from now.  Thoracic aortic aneurysm A sending aorta on the border of mild to moderate enlargement, not significantly changed from last year.  We will continue with annual surveillance with MRA.  Next scan will be in 01/2019.  Continue with blood pressure control and statin therapy.  Patient should avoid fluoroquinolones, if possible.  Type 2 diabetes mellitus Suboptimally controlled with most recent hemoglobin A1c at 8.2.  Courage Mr. Brisendine to speak with Dr. Silvio Pate about optimizing his diabetes regimen.  Addition of empagliflozin could be considered, given its beneficial cardiac profile.  Hyperlipidemia LDL well controlled on last check earlier this month (goal LDL less than 70).  Continue rosuvastatin 5 mg daily.  Hypertension Blood pressure is well controlled today.  We will continue lisinopril 40 mg daily and metoprolol succinate 50 mg daily.  Follow-up: Return to see me in the [place] office in 1 year.  Mikeyla Music,  MD 03/18/2018 5:07 PM

## 2018-03-18 ENCOUNTER — Encounter: Payer: Self-pay | Admitting: Internal Medicine

## 2018-03-18 DIAGNOSIS — E119 Type 2 diabetes mellitus without complications: Secondary | ICD-10-CM | POA: Insufficient documentation

## 2018-04-28 ENCOUNTER — Other Ambulatory Visit: Payer: Self-pay | Admitting: Cardiovascular Disease

## 2018-05-04 ENCOUNTER — Other Ambulatory Visit: Payer: Self-pay

## 2018-05-04 MED ORDER — METOPROLOL SUCCINATE ER 50 MG PO TB24: 50.0000 mg | ORAL_TABLET | Freq: Every day | ORAL | 2 refills | Status: DC

## 2018-05-04 MED ORDER — ROSUVASTATIN CALCIUM 5 MG PO TABS: 5.0000 mg | ORAL_TABLET | Freq: Every day | ORAL | 2 refills | Status: DC

## 2018-06-01 ENCOUNTER — Ambulatory Visit: Payer: 59 | Admitting: Endocrinology

## 2018-06-01 ENCOUNTER — Encounter: Payer: Self-pay | Admitting: Endocrinology

## 2018-06-01 VITALS — BP 136/82 | HR 79 | Ht 73.0 in | Wt 244.6 lb

## 2018-06-01 DIAGNOSIS — E119 Type 2 diabetes mellitus without complications: Secondary | ICD-10-CM

## 2018-06-01 LAB — POCT GLYCOSYLATED HEMOGLOBIN (HGB A1C): Hemoglobin A1C: 9.7 % — AB (ref 4.0–5.6)

## 2018-06-01 MED ORDER — SEMAGLUTIDE(0.25 OR 0.5MG/DOS) 2 MG/1.5ML ~~LOC~~ SOPN: 0.2500 mg | PEN_INJECTOR | SUBCUTANEOUS | 11 refills | Status: DC

## 2018-06-01 MED ORDER — METFORMIN HCL ER 500 MG PO TB24: 2000.0000 mg | ORAL_TABLET | Freq: Every day | ORAL | 3 refills | Status: DC

## 2018-06-01 NOTE — Progress Notes (Signed)
Subjective:    Patient ID: Timothy Bernard, male    DOB: 02-Mar-1956, 62 y.o.   MRN: [id number]  HPI pt is referred by Dr Silvio Pate, for diabetes.  Pt states DM was dx'ed in 2014; he has mild if any neuropathy of the lower extremities, but he has associated TAA; he has never been on insulin; pt says his diet and exercise are fair; he has never had pancreatitis, pancreatic surgery, severe hypoglycemia or DKA. He did not tolerate trulicity (n/d).  He takes 2 oral meds.  He says cbg's are in the 200's.  Past Medical History:  Diagnosis Date  . Adenomatous colon polyp 09/29/2007   20mm cecal adenomatous polyp 10 mm adenoama, post-polypectomy burn syndrome  . Ascending aortic aneurysm (HCC)    4.8 cm on ech (5/10), 4.6 x 4.6 cm by MRA 6/10 4.6 cm aortic root and ascending aorta by MRA 9/10. MRA chest (3/11) 4.5 ascending aorta. Stable by MRA 6/18  . Bicuspid aortic valve    echo (5/10) with bicuspid AoV (upon re-review), mild LV dilation, EF 50-55%, no aortic stenosis, mild AI, moderate ascending aortic dilation (4.8 cm). 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 Echo (3/11) bicuspid aortic valve with no AS but mild AI, mild LVH, EF 60%.    . Blood transfusion without reported diagnosis   . Cataract   . HTN (hypertension)   . Hyperlipidemia   . 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 OSA on CPAP     Past Surgical History:  Procedure Laterality Date  . COLONOSCOPY W/ BIOPSIES AND POLYPECTOMY  09/29/2007  . Mole removed      Social History   Socioeconomic History  . Marital status: Married    Spouse name: Not on file  . Number of children: Not on file  . Years of education: Not on file  . Highest education level: Not on file  Occupational History  . Occupation: Therapist, sports: [employer]  . Financial resource strain: Not on file  . Food insecurity:    Worry: Not on file    Inability: Not on file  . Transportation needs:    Medical: Not on  file    Non-medical: Not on file  Tobacco Use  . Smoking status: Never Smoker  . Smokeless tobacco: Never Used  Substance and Sexual Activity  . Alcohol use: No    Alcohol/week: 0.0 standard drinks  . Drug use: No  . Sexual activity: Not on file  Lifestyle  . Physical activity:    Days per week: Not on file    Minutes per session: Not on file  . Stress: Not on file  Relationships  . Social connections:    Talks on phone: Not on file    Gets together: Not on file    Attends religious service: Not on file    Active member of club or organization: Not on file    Attends meetings of clubs or organizations: Not on file    Relationship status: Not on file  . Intimate partner violence:    Fear of current or ex partner: Not on file    Emotionally abused: Not on file    Physically abused: Not on file    Forced sexual activity: Not on file  Other Topics Concern  . Not on file  Social History Narrative   [place]    Married, 1 stepson       Pt signed designated party  release form and gives Marisa Hufstetler, spouse [phone], access to medical records. Can leave msg on answering machine.     Current Outpatient Medications on File Prior to Visit  Medication Sig Dispense Refill  . aspirin 81 MG tablet Take 81 mg by mouth daily.      . Flaxseed, Linseed, (FLAX SEED OIL) 1000 MG CAPS Take 1 capsule by mouth daily.     [name] glipiZIDE (GLUCOTROL XL) 10 MG 24 hr tablet TAKE 1 TABLET BY MOUTH DAILY WITH BREAKFAST. 90 tablet 3  . Lancets (ONETOUCH ULTRASOFT) lancets 1 each by Other route daily. Dx:250.00 100 each 11  . lisinopril (PRINIVIL,ZESTRIL) 20 MG tablet TAKE 2 TABLETS BY MOUTH  DAILY 180 tablet 3  . metoprolol succinate (TOPROL-XL) 50 MG 24 hr tablet Take 1 tablet (50 mg total) by mouth daily. Take with or immediately following a meal. 90 tablet 2  . ONE TOUCH ULTRA TEST test strip USE TO TEST BLOOD SUGAR ONCE DAILY E11.65 100 each 3  . rosuvastatin (CRESTOR) 5 MG tablet Take 1  tablet (5 mg total) by mouth daily. 90 tablet 2  . sildenafil (VIAGRA) 100 MG tablet TAKE 1 TABLET BY MOUTH DAILY AS NEEDED 5 tablet 11   No current facility-administered medications on file prior to visit.     No Known Allergies  Family History  Problem Relation Age of Onset  . Hypertension Mother   . Diabetes Mother   . Cancer Father   . Coronary artery disease Maternal Grandfather   . Hypertension Other        maternal side  . Colon cancer Neg Hx   . Prostate cancer Neg Hx   . Esophageal cancer Neg Hx   . Liver cancer Neg Hx   . Pancreatic cancer Neg Hx   . Stomach cancer Neg Hx   . Rectal cancer Neg Hx     BP 136/82 (BP Location: Left Arm, Patient Position: Sitting)   Pulse 79   Ht 6\' 1"  (1.854 m)   Wt 244 lb 9.6 oz (110.9 kg)   SpO2 97%   BMI 32.27 kg/m     Review of Systems denies weight loss, blurry vision, headache, chest pain, sob, n/v, urinary frequency, excessive diaphoresis, memory loss, depression, cold intolerance, rhinorrhea, and easy bruising.  He has nocturnal leg cramps.      Objective:   Physical Exam VS: see vs page GEN: no distress HEAD: head: no deformity eyes: no periorbital swelling, no proptosis external nose and ears are normal mouth: no lesion seen NECK: supple, thyroid is not enlarged CHEST WALL: no deformity LUNGS: clear to auscultation CV: reg rate and rhythm, no murmur ABD: abdomen is soft, nontender.  no hepatosplenomegaly.  not distended.  no hernia MUSCULOSKELETAL: muscle bulk and strength are grossly normal.  no obvious joint swelling.  gait is normal and steady EXTEMITIES: no deformity.  no ulcer on the feet.  feet are of normal color and temp.  1+ bilat leg edema, and bilat vv's.  Contusion is noted at the left great toenail.   PULSES: dorsalis pedis intact bilat.  no carotid bruit.   NEURO:  cn 2-12 grossly intact.   readily moves all 4's.  sensation is intact to touch on the feet.   SKIN:  Normal texture and temperature.   No rash or suspicious lesion is visible.   NODES:  None palpable at the neck.   PSYCH: alert, well-oriented.  Does not appear anxious nor depressed.    Lab  Results  Component Value Date   HGBA1C 9.7 (A) 06/01/2018   Lab Results  Component Value Date   CREATININE 1.13 03/07/2018   BUN 14 03/07/2018   NA 137 03/07/2018   K 4.3 03/07/2018   CL 104 03/07/2018   CO2 27 03/07/2018   I have reviewed outside records, and summarized: Pt was noted to have elevated a1c, and referred here.  Other probs addressed were sleep apnea, wellness, and HTN.     Assessment & Plan:  Type 2 DM, new to me: we discussed.  He declines insulin, at least for now.   Patient Instructions  good diet and exercise significantly improve the control of your diabetes.  please let me know if you wish to be referred to a dietician.  high blood sugar is very risky to your health.  you should see an eye doctor and dentist every year.  It is very important to get all recommended vaccinations.  Controlling your blood pressure and cholesterol drastically reduces the damage diabetes does to your body.  Those who smoke should quit.  Please discuss these with your doctor.  check your blood sugar once a day.  vary the time of day when you check, between before the 3 meals, and at bedtime.  also check if you have symptoms of your blood sugar being too high or too low.  please keep a record of the readings and bring it to your next appointment here (or you can bring the meter itself).  You can write it on any piece of paper.  please call us sooner if your blood sugar goes below 70, or if you have a lot of readings over 200.   I have sent 2 prescriptions to your pharmacy: to change metformin to extended-release, and to add "Ozempic." Please call or message [place] next week, to tell us how the blood sugar is doing.  We can increase the ozempic. We can also add "jardiance" if necessary.   Please come back for a follow-up appointment in 2  months.

## 2018-06-01 NOTE — Patient Instructions (Addendum)
good diet and exercise significantly improve the control of your diabetes.  please let me know if you wish to be referred to a dietician.  high blood sugar is very risky to your health.  you should see an eye doctor and dentist every year.  It is very important to get all recommended vaccinations.  Controlling your blood pressure and cholesterol drastically reduces the damage diabetes does to your body.  Those who smoke should quit.  Please discuss these with your doctor.  check your blood sugar once a day.  vary the time of day when you check, between before the 3 meals, and at bedtime.  also check if you have symptoms of your blood sugar being too high or too low.  please keep a record of the readings and bring it to your next appointment here (or you can bring the meter itself).  You can write it on any piece of paper.  please call us sooner if your blood sugar goes below 70, or if you have a lot of readings over 200.   I have sent 2 prescriptions to your pharmacy: to change metformin to extended-release, and to add "Ozempic." Please call or message [place] next week, to tell us how the blood sugar is doing.  We can increase the ozempic. We can also add "jardiance" if necessary.   Please come back for a follow-up appointment in 2 months.

## 2018-08-02 ENCOUNTER — Ambulatory Visit: Payer: 59 | Admitting: Endocrinology

## 2018-08-02 ENCOUNTER — Encounter: Payer: Self-pay | Admitting: Endocrinology

## 2018-08-02 VITALS — BP 132/72 | HR 72 | Ht 73.0 in | Wt 238.2 lb

## 2018-08-02 DIAGNOSIS — E119 Type 2 diabetes mellitus without complications: Secondary | ICD-10-CM | POA: Diagnosis not present

## 2018-08-02 LAB — POCT GLYCOSYLATED HEMOGLOBIN (HGB A1C): Hemoglobin A1C: 7.8 % — AB (ref 4.0–5.6)

## 2018-08-02 MED ORDER — CANAGLIFLOZIN 100 MG PO TABS: 100.0000 mg | ORAL_TABLET | Freq: Every day | ORAL | 11 refills | Status: DC

## 2018-08-02 MED ORDER — GLIPIZIDE ER 5 MG PO TB24
5.0000 mg | ORAL_TABLET | Freq: Every day | ORAL | 11 refills | Status: DC
Start: 2018-08-02 — End: 2018-10-04

## 2018-08-02 NOTE — Patient Instructions (Addendum)
check your blood sugar once a day.  vary the time of day when you check, between before the 3 meals, and at bedtime.  also check if you have symptoms of your blood sugar being too high or too low.  please keep a record of the readings and bring it to your next appointment here (or you can bring the meter itself).  You can write it on any piece of paper.  please call us sooner if your blood sugar goes below 70, or if you have a lot of readings over 200.   I have sent 2 prescriptions to your pharmacy: to add "invokana," and to reduce the glipizide.  Please continue the same other diabetes medications.  Please come back for a follow-up appointment in 2 months.

## 2018-08-02 NOTE — Progress Notes (Signed)
Subjective:    Patient ID: Timothy Bernard, male    DOB: April 03, 1956, 62 y.o.   MRN: [id number]  HPI Pt returns for f/u of diabetes mellitus: DM type: 2 Dx'ed: 9379 Complications: TAA Therapy: Ozempic and 2 oral meds DKA: never Severe hypoglycemia: never Pancreatitis: never Pancreatic imaging: never Other: He did not tolerate trulicity (n/d); he declines insulin, at least for now Interval history: He tried to increase the Ozempic to 0.5 mg weekly, but he did not tolerate nauses and diarrhea.  He brings a record of his cbg's which I have reviewed today.  cbg varies from 133-235.   Past Medical History:  Diagnosis Date  . Adenomatous colon polyp 09/29/2007   59mm cecal adenomatous polyp 10 mm adenoama, post-polypectomy burn syndrome  . Ascending aortic aneurysm (HCC)    4.8 cm on ech (5/10), 4.6 x 4.6 cm by MRA 6/10 4.6 cm aortic root and ascending aorta by MRA 9/10. MRA chest (3/11) 4.5 ascending aorta. Stable by MRA 6/18  . Bicuspid aortic valve    echo (5/10) with bicuspid AoV (upon re-review), mild LV dilation, EF 50-55%, no aortic stenosis, mild AI, moderate ascending aortic dilation (4.8 cm). 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 Echo (3/11) bicuspid aortic valve with no AS but mild AI, mild LVH, EF 60%.    . Blood transfusion without reported diagnosis   . Cataract   . HTN (hypertension)   . Hyperlipidemia   . 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 OSA on CPAP     Past Surgical History:  Procedure Laterality Date  . COLONOSCOPY W/ BIOPSIES AND POLYPECTOMY  09/29/2007  . Mole removed      Social History   Socioeconomic History  . Marital status: Married    Spouse name: Not on file  . Number of children: Not on file  . Years of education: Not on file  . Highest education level: Not on file  Occupational History  . 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 Financial resource strain: Not on file  . Food insecurity:   Worry: Not on file    Inability: Not on file  . Transportation needs:    Medical: Not on file    Non-medical: Not on file  Tobacco Use  . Smoking status: Never Smoker  . Smokeless tobacco: Never Used  Substance and Sexual Activity  . Alcohol use: No    Alcohol/week: 0.0 standard drinks  . Drug use: No  . Sexual activity: Not on file  Lifestyle  . Physical activity:    Days per week: Not on file    Minutes per session: Not on file  . Stress: Not on file  Relationships  . Social connections:    Talks on phone: Not on file    Gets together: Not on file    Attends religious service: Not on file    Active member of club or organization: Not on file    Attends meetings of clubs or organizations: Not on file    Relationship status: Not on file  . Intimate partner violence:    Fear of current or ex partner: Not on file    Emotionally abused: Not on file    Physically abused: Not on file    Forced sexual activity: Not on file  Other Topics Concern  . Not on file  Social History Narrative   [place]    Married, 1 stepson  Pt signed designated party release form and gives Manvir Prabhu, spouse [phone], access to medical records. Can leave msg on answering machine.     Current Outpatient Medications on File Prior to Visit  Medication Sig Dispense Refill  . aspirin 81 MG tablet Take 81 mg by mouth daily.      . Flaxseed, Linseed, (FLAX SEED OIL) 1000 MG CAPS Take 1 capsule by mouth daily.     . Lancets (ONETOUCH ULTRASOFT) lancets 1 each by Other route daily. Dx:250.00 100 each 11  . lisinopril (PRINIVIL,ZESTRIL) 20 MG tablet TAKE 2 TABLETS BY MOUTH  DAILY 180 tablet 3  . metFORMIN (GLUCOPHAGE-XR) 500 MG 24 hr tablet Take 4 tablets (2,000 mg total) by mouth daily with breakfast. 360 tablet 3  . metoprolol succinate (TOPROL-XL) 50 MG 24 hr tablet Take 1 tablet (50 mg total) by mouth daily. Take with or immediately following a meal. 90 tablet 2  . ONE TOUCH ULTRA  TEST test strip USE TO TEST BLOOD SUGAR ONCE DAILY E11.65 100 each 3  . rosuvastatin (CRESTOR) 5 MG tablet Take 1 tablet (5 mg total) by mouth daily. 90 tablet 2  . Semaglutide,0.25 or 0.5MG /DOS, (OZEMPIC, 0.25 OR 0.5 MG/DOSE,) 2 MG/1.5ML SOPN Inject 0.25 mg into the skin once a week. 4 pen 11  . sildenafil (VIAGRA) 100 MG tablet TAKE 1 TABLET BY MOUTH DAILY AS NEEDED 5 tablet 11   No current facility-administered medications on file prior to visit.     No Known Allergies  Family History  Problem Relation Age of Onset  . Hypertension Mother   . Diabetes Mother   . Cancer Father   . Coronary artery disease Maternal Grandfather   . Hypertension Other        maternal side  . Colon cancer Neg Hx   . Prostate cancer Neg Hx   . Esophageal cancer Neg Hx   . Liver cancer Neg Hx   . Pancreatic cancer Neg Hx   . Stomach cancer Neg Hx   . Rectal cancer Neg Hx     BP 132/72 (BP Location: Left Arm, Patient Position: Sitting, Cuff Size: Normal)   Pulse 72   Ht 6\' 1"  (1.854 m)   Wt 238 lb 3.2 oz (108 kg)   SpO2 98%   BMI 31.43 kg/m   Review of Systems He denies hypoglycemia.  He has lost 6 lbs, since last ov    Objective:   Physical Exam VITAL SIGNS:  See vs page GENERAL: no distress Pulses: dorsalis pedis intact bilat.   MSK: no deformity of the feet CV: 1+ bilat leg edema, and bilat vv's Skin:  no ulcer on the feet.  normal color and temp on the feet. Neuro: sensation is intact to touch on the feet. Ext: There is bilateral onychomycosis of the toenails.     Lab Results  Component Value Date   HGBA1C 7.8 (A) 08/02/2018   Lab Results  Component Value Date   CREATININE 1.13 03/07/2018   BUN 14 03/07/2018   NA 137 03/07/2018   K 4.3 03/07/2018   CL 104 03/07/2018   CO2 27 03/07/2018       Assessment & Plan:  Type 2 DM, with TAA: he needs increased rx.  Goal is also to minimize or d/c glipizide   Patient Instructions  check your blood sugar once a day.  vary the  time of day when you check, between before the 3 meals, and at bedtime.  also check if  you have symptoms of your blood sugar being too high or too low.  please keep a record of the readings and bring it to your next appointment here (or you can bring the meter itself).  You can write it on any piece of paper.  please call us sooner if your blood sugar goes below 70, or if you have a lot of readings over 200.   I have sent 2 prescriptions to your pharmacy: to add "invokana," and to reduce the glipizide.  Please continue the same other diabetes medications.  Please come back for a follow-up appointment in 2 months.

## 2018-09-08 ENCOUNTER — Ambulatory Visit: Payer: 59 | Admitting: Internal Medicine

## 2018-09-08 ENCOUNTER — Encounter: Payer: Self-pay | Admitting: Internal Medicine

## 2018-09-08 VITALS — BP 100/66 | HR 78 | Temp 98.5°F | Ht 73.0 in | Wt 237.0 lb

## 2018-09-08 DIAGNOSIS — N529 Male erectile dysfunction, unspecified: Secondary | ICD-10-CM | POA: Diagnosis not present

## 2018-09-08 MED ORDER — TADALAFIL 20 MG PO TABS: 10.0000 mg | ORAL_TABLET | ORAL | 5 refills | Status: DC | PRN

## 2018-09-08 NOTE — Assessment & Plan Note (Addendum)
Does well with brand viagra but not generic Will try generic cialis Consider alternative pharmacies Not interested in vacuum device or injections Would hold off on checking testosterone
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
## 2018-10-02 ENCOUNTER — Other Ambulatory Visit: Payer: Self-pay | Admitting: Internal Medicine

## 2018-10-04 ENCOUNTER — Encounter: Payer: Self-pay | Admitting: Endocrinology

## 2018-10-04 ENCOUNTER — Ambulatory Visit: Payer: 59 | Admitting: Endocrinology

## 2018-10-04 VITALS — BP 110/60 | HR 82 | Ht 73.0 in | Wt 236.4 lb

## 2018-10-04 DIAGNOSIS — E119 Type 2 diabetes mellitus without complications: Secondary | ICD-10-CM | POA: Diagnosis not present

## 2018-10-04 LAB — POCT GLYCOSYLATED HEMOGLOBIN (HGB A1C): HEMOGLOBIN A1C: 7.6 % — AB (ref 4.0–5.6)

## 2018-10-04 MED ORDER — SEMAGLUTIDE(0.25 OR 0.5MG/DOS) 2 MG/1.5ML ~~LOC~~ SOPN: 0.5000 mg | PEN_INJECTOR | SUBCUTANEOUS | 11 refills | Status: DC

## 2018-10-04 MED ORDER — GLIPIZIDE ER 2.5 MG PO TB24: 2.5000 mg | ORAL_TABLET | Freq: Every day | ORAL | 11 refills | Status: DC

## 2018-10-04 NOTE — Patient Instructions (Addendum)
check your blood sugar once a day.  vary the time of day when you check, between before the 3 meals, and at bedtime.  also check if you have symptoms of your blood sugar being too high or too low.  please keep a record of the readings and bring it to your next appointment here (or you can bring the meter itself).  You can write it on any piece of paper.  please call us sooner if your blood sugar goes below 70, or if you have a lot of readings over 200.   let's increase the Ozempic, and to reduce the glipizide.  Please continue the same other diabetes medications.  Please come back for a follow-up appointment in 2 months.
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
## 2018-10-06 ENCOUNTER — Other Ambulatory Visit: Payer: Self-pay | Admitting: Internal Medicine

## 2018-10-21 LAB — HM DIABETES EYE EXAM

## 2018-10-25 DIAGNOSIS — H2513 Age-related nuclear cataract, bilateral: Secondary | ICD-10-CM | POA: Diagnosis not present

## 2018-10-25 DIAGNOSIS — E119 Type 2 diabetes mellitus without complications: Secondary | ICD-10-CM | POA: Diagnosis not present

## 2018-10-25 DIAGNOSIS — H35033 Hypertensive retinopathy, bilateral: Secondary | ICD-10-CM | POA: Diagnosis not present

## 2018-12-20 ENCOUNTER — Encounter: Payer: Self-pay | Admitting: General Practice

## 2018-12-21 ENCOUNTER — Ambulatory Visit: Payer: 59 | Admitting: Endocrinology

## 2018-12-21 ENCOUNTER — Ambulatory Visit (INDEPENDENT_AMBULATORY_CARE_PROVIDER_SITE_OTHER): Payer: 59 | Admitting: Endocrinology

## 2018-12-21 ENCOUNTER — Other Ambulatory Visit: Payer: Self-pay

## 2018-12-21 DIAGNOSIS — E1165 Type 2 diabetes mellitus with hyperglycemia: Secondary | ICD-10-CM | POA: Diagnosis not present

## 2018-12-21 DIAGNOSIS — N529 Male erectile dysfunction, unspecified: Secondary | ICD-10-CM | POA: Diagnosis not present

## 2018-12-21 DIAGNOSIS — IMO0001 Reserved for inherently not codable concepts without codable children: Secondary | ICD-10-CM
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
## 2018-12-21 NOTE — Patient Instructions (Addendum)
check your blood sugar once a day.  vary the time of day when you check, between before the 3 meals, and at bedtime.  also check if you have symptoms of your blood sugar being too high or too low.  please keep a record of the readings and bring it to your next appointment here (or you can bring the meter itself).  You can write it on any piece of paper.  please call us sooner if your blood sugar goes below 70, or if you have a lot of readings over 200.   let's increase the Ozempic to 0.5 mg weekly, and to stop the glipizide.  Please continue the same other diabetes medications.  If necessary, we can increase the invokana.   Blood tests are requested for you today.  We'll let you know about the results.  Please come back for a follow-up appointment in 2 months.

## 2018-12-22 ENCOUNTER — Other Ambulatory Visit (INDEPENDENT_AMBULATORY_CARE_PROVIDER_SITE_OTHER): Payer: 59

## 2018-12-22 ENCOUNTER — Other Ambulatory Visit: Payer: Self-pay

## 2018-12-22 DIAGNOSIS — N529 Male erectile dysfunction, unspecified: Secondary | ICD-10-CM

## 2018-12-22 DIAGNOSIS — E1165 Type 2 diabetes mellitus with hyperglycemia: Secondary | ICD-10-CM

## 2018-12-22 DIAGNOSIS — IMO0001 Reserved for inherently not codable concepts without codable children: Secondary | ICD-10-CM

## 2018-12-22 LAB — TSH: TSH: 0.99 u[IU]/mL (ref 0.35–4.50)

## 2018-12-23 LAB — TESTOSTERONE,FREE AND TOTAL
Testosterone, Free: 9.3 pg/mL (ref 6.6–18.1)
Testosterone: 734 ng/dL (ref 264–916)

## 2019-01-03 ENCOUNTER — Telehealth: Payer: Self-pay | Admitting: Endocrinology

## 2019-01-03 DIAGNOSIS — N529 Male erectile dysfunction, unspecified: Secondary | ICD-10-CM

## 2019-01-03 NOTE — Telephone Encounter (Signed)
Please place referral order for Urology

## 2019-01-03 NOTE — Telephone Encounter (Signed)
I did the referral 

## 2019-01-03 NOTE — Telephone Encounter (Signed)
Patient called and made a follow-up visit and also stated that Dr.Ellison was going to be placing a referral for him to see urology and wanted to get an update.  Please Advise, Thanks

## 2019-01-08 NOTE — Telephone Encounter (Signed)
PT SECOND REQUEST  Pt called after hours service requesting status of Urology Referral. Please call pt to provide him with a status update.

## 2019-01-08 NOTE — Telephone Encounter (Signed)
This referral has already been sent to [facility] as stated in sent section of referral-it will be up to [facility] to call the patient for scheduling

## 2019-02-06 ENCOUNTER — Other Ambulatory Visit: Payer: Self-pay

## 2019-02-08 ENCOUNTER — Other Ambulatory Visit: Payer: Self-pay

## 2019-02-08 ENCOUNTER — Ambulatory Visit: Payer: 59 | Admitting: Endocrinology

## 2019-02-08 ENCOUNTER — Encounter: Payer: Self-pay | Admitting: Endocrinology

## 2019-02-08 VITALS — BP 128/82 | HR 80 | Temp 98.0°F | Wt 223.0 lb

## 2019-02-08 DIAGNOSIS — E1165 Type 2 diabetes mellitus with hyperglycemia: Secondary | ICD-10-CM

## 2019-02-08 DIAGNOSIS — R42 Dizziness and giddiness: Secondary | ICD-10-CM | POA: Diagnosis not present

## 2019-02-08 DIAGNOSIS — IMO0001 Reserved for inherently not codable concepts without codable children: Secondary | ICD-10-CM

## 2019-02-08 LAB — POCT GLYCOSYLATED HEMOGLOBIN (HGB A1C): Hemoglobin A1C: 7.3 % — AB (ref 4.0–5.6)

## 2019-02-08 NOTE — Patient Instructions (Addendum)
check your blood sugar once a day.  vary the time of day when you check, between before the 3 meals, and at bedtime.  also check if you have symptoms of your blood sugar being too high or too low.  please keep a record of the readings and bring it to your next appointment here (or you can bring the meter itself).  You can write it on any piece of paper.  please call us sooner if your blood sugar goes below 70, or if you have a lot of readings over 200.   Please continue the same medications. Try putting your home blood pressure cuff on, then standing upright.  Please call Dr Silvio Pate if BP goes below 100/-. Please come back for a follow-up appointment in 2-3 months.

## 2019-02-08 NOTE — Progress Notes (Signed)
Subjective:    Patient ID: Timothy Bernard, male    DOB: 10-10-1955, 63 y.o.   MRN: [id number]  HPI Pt returns for f/u of diabetes mellitus: DM type: 2 Dx'ed: 5366 Complications: TAA and renal insuff.  Therapy: Ozempic and 2 oral meds DKA: never Severe hypoglycemia: never Pancreatitis: never Pancreatic imaging: never Other: He did not tolerate trulicity (n/d); he declines insulin, at least for now.  Interval history: pt states cbg varies from 111-188.  pt states he feels well in general, except for slight dizziness upon standing.   Past Medical History:  Diagnosis Date  . Adenomatous colon polyp 09/29/2007   35mm cecal adenomatous polyp 10 mm adenoama, post-polypectomy burn syndrome  . Ascending aortic aneurysm (HCC)    4.8 cm on ech (5/10), 4.6 x 4.6 cm by MRA 6/10 4.6 cm aortic root and ascending aorta by MRA 9/10. MRA chest (3/11) 4.5 ascending aorta. Stable by MRA 6/18  . Bicuspid aortic valve    echo (5/10) with bicuspid AoV (upon re-review), mild LV dilation, EF 50-55%, no aortic stenosis, mild AI, moderate ascending aortic dilation (4.8 cm). 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 Echo (3/11) bicuspid aortic valve with no AS but mild AI, mild LVH, EF 60%.    . Blood transfusion without reported diagnosis   . Cataract   . HTN (hypertension)   . Hyperlipidemia   . 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 OSA on CPAP     Past Surgical History:  Procedure Laterality Date  . COLONOSCOPY W/ BIOPSIES AND POLYPECTOMY  09/29/2007  . Mole removed      Social History   Socioeconomic History  . Marital status: Married    Spouse name: Not on file  . Number of children: Not on file  . Years of education: Not on file  . Highest education level: Not on file  Occupational History  . 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 Financial resource strain: Not on file  . Food insecurity    Worry: Not on file    Inability: Not on file   . Transportation needs    Medical: Not on file    Non-medical: Not on file  Tobacco Use  . Smoking status: Never Smoker  . Smokeless tobacco: Never Used  Substance and Sexual Activity  . Alcohol use: No    Alcohol/week: 0.0 standard drinks  . Drug use: No  . Sexual activity: Not on file  Lifestyle  . Physical activity    Days per week: Not on file    Minutes per session: Not on file  . Stress: Not on file  Relationships  . Social Herbalist on phone: Not on file    Gets together: Not on file    Attends religious service: Not on file    Active member of club or organization: Not on file    Attends meetings of clubs or organizations: Not on file    Relationship status: Not on file  . Intimate partner violence    Fear of current or ex partner: Not on file    Emotionally abused: Not on file    Physically abused: Not on file    Forced sexual activity: Not on file  Other Topics Concern  . Not on file  Social History Narrative   [place].    Married, 1 stepson       Pt signed designated party release form and gives  Damaria Stofko, spouse [phone], access to medical records. Can leave msg on answering machine.     Current Outpatient Medications on File Prior to Visit  Medication Sig Dispense Refill  . aspirin 81 MG tablet Take 81 mg by mouth daily.      . canagliflozin (INVOKANA) 100 MG TABS tablet Take 1 tablet (100 mg total) by mouth daily before breakfast. 30 tablet 11  . Flaxseed, Linseed, (FLAX SEED OIL) 1000 MG CAPS Take 1 capsule by mouth daily.     . Lancets (ONETOUCH ULTRASOFT) lancets 1 each by Other route daily. Dx:250.00 100 each 11  . lisinopril (PRINIVIL,ZESTRIL) 20 MG tablet TAKE 2 TABLETS BY MOUTH  DAILY 180 tablet 3  . metFORMIN (GLUCOPHAGE-XR) 500 MG 24 hr tablet Take 4 tablets (2,000 mg total) by mouth daily with breakfast. (Patient taking differently: Take 2,000 mg by mouth daily with breakfast. Take 2 tablets with breakfast and 2 tablets  with dinner) 360 tablet 3  . metoprolol succinate (TOPROL-XL) 50 MG 24 hr tablet Take 1 tablet (50 mg total) by mouth daily. Take with or immediately following a meal. 90 tablet 2  . ONE TOUCH ULTRA TEST test strip USE TO TEST BLOOD SUGAR ONCE DAILY E11.65 50 each 7  . rosuvastatin (CRESTOR) 5 MG tablet Take 1 tablet (5 mg total) by mouth daily. 90 tablet 2  . Semaglutide,0.25 or 0.5MG /DOS, (OZEMPIC, 0.25 OR 0.5 MG/DOSE,) 2 MG/1.5ML SOPN Inject 0.5 mg into the skin once a week. 1 pen 11  . tadalafil (ADCIRCA/CIALIS) 20 MG tablet Take 0.5-1 tablets (10-20 mg total) by mouth every three (3) days as needed for erectile dysfunction. 10 tablet 5   No current facility-administered medications on file prior to visit.     No Known Allergies  Family History  Problem Relation Age of Onset  . Hypertension Mother   . Diabetes Mother   . Cancer Father   . Coronary artery disease Maternal Grandfather   . Hypertension Other        maternal side  . Colon cancer Neg Hx   . Prostate cancer Neg Hx   . Esophageal cancer Neg Hx   . Liver cancer Neg Hx   . Pancreatic cancer Neg Hx   . Stomach cancer Neg Hx   . Rectal cancer Neg Hx     BP 128/82 (BP Location: Right Arm, Patient Position: Sitting, Cuff Size: Normal)   Pulse 80   Temp 98 F (36.7 C) (Oral)   Wt 223 lb (101.2 kg)   SpO2 92%   BMI 29.42 kg/m    Review of Systems He has lost 13 lbs since last in-person visit.  Denies LOC.  ED is much better.      Objective:   Physical Exam VITAL SIGNS:  See vs page GENERAL: no distress Pulses: dorsalis pedis intact bilat.   MSK: no deformity of the feet CV: trace bilat leg edema, and bilat vv's.   Skin:  no ulcer on the feet.  normal color and temp on the feet. Neuro: sensation is intact to touch on the feet.    A1c=7.3%    Assessment & Plan:  Type 2 DM: he needs increased rx.   Weight loss: I told pt that increasing Ozempic and/or Invokana will help this more, but he declines  Lightheadedness, postural, new.    Patient Instructions  check your blood sugar once a day.  vary the time of day when you check, between before the 3 meals, and at bedtime.  also check if you have symptoms of your blood sugar being too high or too low.  please keep a record of the readings and bring it to your next appointment here (or you can bring the meter itself).  You can write it on any piece of paper.  please call us sooner if your blood sugar goes below 70, or if you have a lot of readings over 200.   Please continue the same medications. Try putting your home blood pressure cuff on, then standing upright.  Please call Dr Silvio Pate if BP goes below 100/-. Please come back for a follow-up appointment in 2-3 months.

## 2019-03-08 ENCOUNTER — Telehealth: Payer: Self-pay | Admitting: Internal Medicine

## 2019-03-08 DIAGNOSIS — I712 Thoracic aortic aneurysm, without rupture, unspecified: Secondary | ICD-10-CM

## 2019-03-08 NOTE — Telephone Encounter (Signed)
Patient spouse Timothy Bernard calling  States that it is time to schedule the MRA and an ECHO - no order States that it is usually done before they see Dr End Please call to discuss

## 2019-03-08 NOTE — Telephone Encounter (Signed)
MRA Ordered per last OV note from Dr. Saunders Revel.  Hx of aortic aneurysm.   To reschedule, cancel, or questions concerning your appointment call scheduling at [phone]. Please arrive 15 minutes prior to appointment time unless lab work is required; If labs required arrive 45 minutes prior to the appointment [facility] [place] [address]  Nothing to eat or drink after midnight except a sip of water with medications.  Hold Metformin 24 hours prior.

## 2019-03-09 ENCOUNTER — Other Ambulatory Visit: Payer: Self-pay | Admitting: Cardiovascular Disease

## 2019-03-12 NOTE — Telephone Encounter (Signed)
This is Dr. Darnelle Bos pt that he wants to see in [place]. Please address

## 2019-03-12 NOTE — Telephone Encounter (Signed)
Pt needing refills would like to f/u with Dr.End in [place]. Please contact pt for 12 month f/u in [place].

## 2019-03-14 ENCOUNTER — Encounter: Payer: Self-pay | Admitting: Internal Medicine

## 2019-03-14 ENCOUNTER — Other Ambulatory Visit: Payer: Self-pay

## 2019-03-14 ENCOUNTER — Ambulatory Visit (INDEPENDENT_AMBULATORY_CARE_PROVIDER_SITE_OTHER): Payer: 59 | Admitting: Internal Medicine

## 2019-03-14 VITALS — BP 112/78 | HR 92 | Temp 98.2°F | Ht 71.0 in | Wt 217.0 lb

## 2019-03-14 DIAGNOSIS — Z125 Encounter for screening for malignant neoplasm of prostate: Secondary | ICD-10-CM

## 2019-03-14 DIAGNOSIS — I712 Thoracic aortic aneurysm, without rupture, unspecified: Secondary | ICD-10-CM

## 2019-03-14 DIAGNOSIS — G4733 Obstructive sleep apnea (adult) (pediatric): Secondary | ICD-10-CM

## 2019-03-14 DIAGNOSIS — E119 Type 2 diabetes mellitus without complications: Secondary | ICD-10-CM | POA: Diagnosis not present

## 2019-03-14 DIAGNOSIS — Z0000 Encounter for general adult medical examination without abnormal findings: Secondary | ICD-10-CM | POA: Diagnosis not present

## 2019-03-14 DIAGNOSIS — I10 Essential (primary) hypertension: Secondary | ICD-10-CM

## 2019-03-14 LAB — CBC
HCT: 46.6 % (ref 39.0–52.0)
Hemoglobin: 15.3 g/dL (ref 13.0–17.0)
MCHC: 32.8 g/dL (ref 30.0–36.0)
MCV: 92.9 fl (ref 78.0–100.0)
Platelets: 300 10*3/uL (ref 150.0–400.0)
RBC: 5.02 Mil/uL (ref 4.22–5.81)
RDW: 14.2 % (ref 11.5–15.5)
WBC: 8.5 10*3/uL (ref 4.0–10.5)

## 2019-03-14 LAB — COMPREHENSIVE METABOLIC PANEL
ALT: 17 U/L (ref 0–53)
AST: 18 U/L (ref 0–37)
Albumin: 4.2 g/dL (ref 3.5–5.2)
Alkaline Phosphatase: 56 U/L (ref 39–117)
BUN: 20 mg/dL (ref 6–23)
CO2: 26 mEq/L (ref 19–32)
Calcium: 9.4 mg/dL (ref 8.4–10.5)
Chloride: 103 mEq/L (ref 96–112)
Creatinine, Ser: 1.11 mg/dL (ref 0.40–1.50)
GFR: 66.87 mL/min (ref >60.00)
Glucose, Bld: 135 mg/dL — ABNORMAL HIGH (ref 70–99)
Potassium: 4.5 mEq/L (ref 3.5–5.1)
Sodium: 138 mEq/L (ref 135–145)
Total Bilirubin: 0.6 mg/dL (ref 0.2–1.2)
Total Protein: 6.9 g/dL (ref 6.0–8.3)

## 2019-03-14 LAB — PSA: PSA: 1.98 ng/mL (ref 0.10–4.00)

## 2019-03-14 LAB — LIPID PANEL
Cholesterol: 118 mg/dL (ref 0–200)
HDL: 31.6 mg/dL — ABNORMAL LOW (ref >39.00)
LDL Cholesterol: 54 mg/dL (ref 0–99)
NonHDL: 86.16
Total CHOL/HDL Ratio: 4
Triglycerides: 162 mg/dL — ABNORMAL HIGH (ref 0.0–149.0)
VLDL: 32.4 mg/dL (ref 0.0–40.0)

## 2019-03-14 MED ORDER — METOPROLOL SUCCINATE ER 50 MG PO TB24: 50.0000 mg | ORAL_TABLET | Freq: Every day | ORAL | 0 refills | Status: DC

## 2019-03-14 MED ORDER — ROSUVASTATIN CALCIUM 5 MG PO TABS: 5.0000 mg | ORAL_TABLET | Freq: Every day | ORAL | 0 refills | Status: DC

## 2019-03-14 NOTE — Assessment & Plan Note (Signed)
No longer uses CPAP

## 2019-03-14 NOTE — Assessment & Plan Note (Signed)
BP Readings from Last 3 Encounters:  03/14/19 112/78  02/08/19 128/82  10/04/18 110/60   Good control

## 2019-03-14 NOTE — Assessment & Plan Note (Signed)
Lab Results  Component Value Date   HGBA1C 7.3 (A) 02/08/2019   Better control now

## 2019-03-14 NOTE — Assessment & Plan Note (Signed)
Healthy Has lost weight but needs to work on fitness Colon due 2024 Will check PSA Flu vaccine in the fall

## 2019-03-14 NOTE — Progress Notes (Signed)
Subjective:    Patient ID: Timothy Bernard, male    DOB: 02/15/1956, 63 y.o.   MRN: [id number]  HPI Here for physical Worked from home for 2 months--back to the office then Plans to work for some more years  Now seeing Dr Loanne Drilling for the diabetes On semaglutide Has lost like 30# in the past year Checks sugars daily---doing well No foot numbness or pain  Keeps up with Dr End Repeat echo coming up, then follow up No apparent symptoms  Current Outpatient Medications on File Prior to Visit  Medication Sig Dispense Refill  . aspirin 81 MG tablet Take 81 mg by mouth daily.      . canagliflozin (INVOKANA) 100 MG TABS tablet Take 1 tablet (100 mg total) by mouth daily before breakfast. 30 tablet 11  . Flaxseed, Linseed, (FLAX SEED OIL) 1000 MG CAPS Take 1 capsule by mouth daily.     . Lancets (ONETOUCH ULTRASOFT) lancets 1 each by Other route daily. Dx:250.00 100 each 11  . lisinopril (PRINIVIL,ZESTRIL) 20 MG tablet TAKE 2 TABLETS BY MOUTH  DAILY 180 tablet 3  . metFORMIN (GLUCOPHAGE-XR) 500 MG 24 hr tablet Take 4 tablets (2,000 mg total) by mouth daily with breakfast. (Patient taking differently: Take 2,000 mg by mouth daily with breakfast. Take 2 tablets with breakfast and 2 tablets with dinner) 360 tablet 3  . metoprolol succinate (TOPROL-XL) 50 MG 24 hr tablet Take 1 tablet (50 mg total) by mouth daily. Take with or immediately following a meal. 90 tablet 2  . ONE TOUCH ULTRA TEST test strip USE TO TEST BLOOD SUGAR ONCE DAILY E11.65 50 each 7  . rosuvastatin (CRESTOR) 5 MG tablet Take 1 tablet (5 mg total) by mouth daily. 90 tablet 2  . Semaglutide,0.25 or 0.5MG /DOS, (OZEMPIC, 0.25 OR 0.5 MG/DOSE,) 2 MG/1.5ML SOPN Inject 0.5 mg into the skin once a week. 1 pen 11  . tadalafil (ADCIRCA/CIALIS) 20 MG tablet Take 0.5-1 tablets (10-20 mg total) by mouth every three (3) days as needed for erectile dysfunction. 10 tablet 5   No current facility-administered medications on file prior to  visit.     No Known Allergies  Past Medical History:  Diagnosis Date  . Adenomatous colon polyp 09/29/2007   87mm cecal adenomatous polyp 10 mm adenoama, post-polypectomy burn syndrome  . Ascending aortic aneurysm (HCC)    4.8 cm on ech (5/10), 4.6 x 4.6 cm by MRA 6/10 4.6 cm aortic root and ascending aorta by MRA 9/10. MRA chest (3/11) 4.5 ascending aorta. Stable by MRA 6/18  . Bicuspid aortic valve    echo (5/10) with bicuspid AoV (upon re-review), mild LV dilation, EF 50-55%, no aortic stenosis, mild AI, moderate ascending aortic dilation (4.8 cm). 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 Echo (3/11) bicuspid aortic valve with no AS but mild AI, mild LVH, EF 60%.    . Blood transfusion without reported diagnosis   . Cataract   . HTN (hypertension)   . Hyperlipidemia   . 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 OSA on CPAP     Past Surgical History:  Procedure Laterality Date  . COLONOSCOPY W/ BIOPSIES AND POLYPECTOMY  09/29/2007  . Mole removed      Family History  Problem Relation Age of Onset  . Hypertension Mother   . Diabetes Mother   . Cancer Father   . Coronary artery disease Maternal Grandfather   . Hypertension Other        maternal side  .  Colon cancer Neg Hx   . Prostate cancer Neg Hx   . Esophageal cancer Neg Hx   . Liver cancer Neg Hx   . Pancreatic cancer Neg Hx   . Stomach cancer Neg Hx   . Rectal cancer Neg Hx     Social History   Socioeconomic History  . Marital status: Married    Spouse name: Not on file  . Number of children: Not on file  . Years of education: Not on file  . Highest education level: Not on file  Occupational History  . Occupation: Therapist, sports: [employer]  . Financial resource strain: Not on file  . Food insecurity    Worry: Not on file    Inability: Not on file  . Transportation needs    Medical: Not on file    Non-medical: Not on file  Tobacco Use  . Smoking status: Never  Smoker  . Smokeless tobacco: Never Used  Substance and Sexual Activity  . Alcohol use: No    Alcohol/week: 0.0 standard drinks  . Drug use: No  . Sexual activity: Not on file  Lifestyle  . Physical activity    Days per week: Not on file    Minutes per session: Not on file  . Stress: Not on file  Relationships  . Social Herbalist on phone: Not on file    Gets together: Not on file    Attends religious service: Not on file    Active member of club or organization: Not on file    Attends meetings of clubs or organizations: Not on file    Relationship status: Not on file  . Intimate partner violence    Fear of current or ex partner: Not on file    Emotionally abused: Not on file    Physically abused: Not on file    Forced sexual activity: Not on file  Other Topics Concern  . Not on file  Social History Narrative   [place].    Married, 1 stepson       Pt signed designated party release form and gives Jermane Brayboy, spouse [phone], access to medical records. Can leave msg on answering machine.    Review of Systems  Constitutional: Negative for fatigue.       No regular exercise--discussed Wears seat belt  HENT: Negative for dental problem, hearing loss and tinnitus.        Keeps up with dentist   Eyes: Negative for visual disturbance.       No diplopia or unilateral vision loss  Respiratory: Negative for cough, chest tightness and shortness of breath.   Cardiovascular: Negative for chest pain, palpitations and leg swelling.  Gastrointestinal: Negative for abdominal pain, blood in stool and constipation.       No heartburn  Genitourinary: Negative for difficulty urinating and urgency.       Happy with the generic cialis  Musculoskeletal: Negative for arthralgias, back pain and joint swelling.  Skin: Negative for rash.       No suspicious lesions  Allergic/Immunologic: Negative for environmental allergies and immunocompromised state.  Neurological:  Negative for dizziness, syncope, light-headedness and headaches.  Hematological: Negative for adenopathy. Does not bruise/bleed easily.  Psychiatric/Behavioral: Negative for dysphoric mood and sleep disturbance. The patient is not nervous/anxious.        Objective:   Physical Exam  Constitutional: He is oriented to person, place, and time. He appears well-developed.  No distress.  HENT:  Head: Normocephalic and atraumatic.  Right Ear: External ear normal.  Left Ear: External ear normal.  Mouth/Throat: Oropharynx is clear and moist. No oropharyngeal exudate.  Eyes: Pupils are equal, round, and reactive to light. Conjunctivae are normal.  Neck: No thyromegaly present.  Cardiovascular: Normal rate, regular rhythm and intact distal pulses. Exam reveals no gallop.  Trigeminy some of the time Soft aortic systolic murmur  Respiratory: Effort normal and breath sounds normal. No respiratory distress. He has no wheezes. He has no rales.  GI: Soft. There is no abdominal tenderness.  Musculoskeletal:        General: No tenderness or edema.  Lymphadenopathy:    He has no cervical adenopathy.  Neurological: He is alert and oriented to person, place, and time.  Skin: No rash noted. No erythema.  No foot leisons  Psychiatric: He has a normal mood and affect. His behavior is normal.           Assessment & Plan:

## 2019-03-14 NOTE — Assessment & Plan Note (Signed)
Associated with bicuspid aortic valve Keeps up with cardiology

## 2019-03-17 ENCOUNTER — Other Ambulatory Visit: Payer: Self-pay

## 2019-03-17 ENCOUNTER — Ambulatory Visit (HOSPITAL_COMMUNITY)
Admission: RE | Admit: 2019-03-17 | Discharge: 2019-03-17 | Disposition: A | Discharge status: Home / Self Care | Payer: 59 | Source: Ambulatory Visit | Attending: Internal Medicine | Admitting: Internal Medicine

## 2019-03-17 DIAGNOSIS — I712 Thoracic aortic aneurysm, without rupture, unspecified: Secondary | ICD-10-CM

## 2019-03-17 MED ORDER — GADOBUTROL 1 MMOL/ML IV SOLN
10.0000 mL | Freq: Once | INTRAVENOUS | Status: AC | PRN
  Administered 2019-03-17: 10:00:00 10 mL via INTRAVENOUS

## 2019-03-20 NOTE — Telephone Encounter (Signed)
Patient scheduled to see Dr. Saunders Revel on 04/27/2019. Both medications refilled on 03/14/2019.

## 2019-04-17 ENCOUNTER — Ambulatory Visit (INDEPENDENT_AMBULATORY_CARE_PROVIDER_SITE_OTHER): Payer: 59

## 2019-04-17 ENCOUNTER — Other Ambulatory Visit: Payer: Self-pay

## 2019-04-17 DIAGNOSIS — I712 Thoracic aortic aneurysm, without rupture, unspecified: Secondary | ICD-10-CM

## 2019-04-23 ENCOUNTER — Encounter: Payer: Self-pay | Admitting: Endocrinology

## 2019-04-23 ENCOUNTER — Other Ambulatory Visit: Payer: Self-pay

## 2019-04-23 ENCOUNTER — Ambulatory Visit: Payer: 59 | Admitting: Endocrinology

## 2019-04-23 VITALS — BP 104/68 | HR 91 | Ht 71.0 in | Wt 216.4 lb

## 2019-04-23 DIAGNOSIS — IMO0001 Reserved for inherently not codable concepts without codable children: Secondary | ICD-10-CM

## 2019-04-23 DIAGNOSIS — E1165 Type 2 diabetes mellitus with hyperglycemia: Secondary | ICD-10-CM | POA: Diagnosis not present

## 2019-04-23 LAB — POCT GLYCOSYLATED HEMOGLOBIN (HGB A1C): Hemoglobin A1C: 7 % — AB (ref 4.0–5.6)

## 2019-04-23 MED ORDER — CANAGLIFLOZIN 300 MG PO TABS: 300.0000 mg | ORAL_TABLET | Freq: Every day | ORAL | 11 refills | Status: DC

## 2019-04-23 NOTE — Progress Notes (Signed)
Subjective:    Patient ID: Timothy Bernard, male    DOB: 11/18/1955, 63 y.o.   MRN: [id number]  HPI Pt returns for f/u of diabetes mellitus: DM type: 2 Dx'ed: 123456 Complications: TAA and renal insuff.  Therapy: Ozempic and 2 oral meds DKA: never Severe hypoglycemia: never Pancreatitis: never Pancreatic imaging: never Other: He did not tolerate trulicity (n/d); he declines insulin, at least for now.  Interval history: He brings a record of his cbg's which I have reviewed today. He checks fasting. cbg varies from 124-206.  pt states he feels well in general.   Past Medical History:  Diagnosis Date  . Adenomatous colon polyp 09/29/2007   26mm cecal adenomatous polyp 10 mm adenoama, post-polypectomy burn syndrome  . Ascending aortic aneurysm (HCC)    4.8 cm on ech (5/10), 4.6 x 4.6 cm by MRA 6/10 4.6 cm aortic root and ascending aorta by MRA 9/10. MRA chest (3/11) 4.5 ascending aorta. Stable by MRA 6/18  . Bicuspid aortic valve    echo (5/10) with bicuspid AoV (upon re-review), mild LV dilation, EF 50-55%, no aortic stenosis, mild AI, moderate ascending aortic dilation (4.8 cm). 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 Echo (3/11) bicuspid aortic valve with no AS but mild AI, mild LVH, EF 60%.    . Blood transfusion without reported diagnosis   . Cataract   . HTN (hypertension)   . Hyperlipidemia   . 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 OSA on CPAP     Past Surgical History:  Procedure Laterality Date  . COLONOSCOPY W/ BIOPSIES AND POLYPECTOMY  09/29/2007  . Mole removed      Social History   Socioeconomic History  . Marital status: Married    Spouse name: Not on file  . Number of children: Not on file  . Years of education: Not on file  . Highest education level: Not on file  Occupational History  . Occupation: Therapist, sports: [employer]  . Financial resource strain: Not on file  . Food insecurity    Worry: Not on file    Inability: Not on file  . Transportation needs    Medical: Not on file    Non-medical: Not on file  Tobacco Use  . Smoking status: Never Smoker  . Smokeless tobacco: Never Used  Substance and Sexual Activity  . Alcohol use: No    Alcohol/week: 0.0 standard drinks  . Drug use: No  . Sexual activity: Not on file  Lifestyle  . Physical activity    Days per week: Not on file    Minutes per session: Not on file  . Stress: Not on file  Relationships  . Social Herbalist on phone: Not on file    Gets together: Not on file    Attends religious service: Not on file    Active member of club or organization: Not on file    Attends meetings of clubs or organizations: Not on file    Relationship status: Not on file  . Intimate partner violence    Fear of current or ex partner: Not on file    Emotionally abused: Not on file    Physically abused: Not on file    Forced sexual activity: Not on file  Other Topics Concern  . Not on file  Social History Narrative   [place].    Married, 1 stepson       Pt signed  designated party release form and gives Mitchal Lauth, spouse [phone], access to medical records. Can leave msg on answering machine.     Current Outpatient Medications on File Prior to Visit  Medication Sig Dispense Refill  . aspirin 81 MG tablet Take 81 mg by mouth daily.      . Flaxseed, Linseed, (FLAX SEED OIL) 1000 MG CAPS Take 1 capsule by mouth daily.     . Lancets (ONETOUCH ULTRASOFT) lancets 1 each by Other route daily. Dx:250.00 100 each 11  . lisinopril (PRINIVIL,ZESTRIL) 20 MG tablet TAKE 2 TABLETS BY MOUTH  DAILY 180 tablet 3  . metFORMIN (GLUCOPHAGE-XR) 500 MG 24 hr tablet Take 4 tablets (2,000 mg total) by mouth daily with breakfast. (Patient taking differently: Take 2,000 mg by mouth daily with breakfast. Take 2 tablets with breakfast and 2 tablets with dinner) 360 tablet 3  . metoprolol succinate (TOPROL-XL) 50 MG 24 hr tablet Take 1 tablet  (50 mg total) by mouth daily. Take with or immediately following a meal. 90 tablet 0  . ONE TOUCH ULTRA TEST test strip USE TO TEST BLOOD SUGAR ONCE DAILY E11.65 50 each 7  . rosuvastatin (CRESTOR) 5 MG tablet Take 1 tablet (5 mg total) by mouth daily. 90 tablet 0  . Semaglutide,0.25 or 0.5MG /DOS, (OZEMPIC, 0.25 OR 0.5 MG/DOSE,) 2 MG/1.5ML SOPN Inject 0.5 mg into the skin once a week. 1 pen 11  . tadalafil (ADCIRCA/CIALIS) 20 MG tablet Take 0.5-1 tablets (10-20 mg total) by mouth every three (3) days as needed for erectile dysfunction. 10 tablet 5   No current facility-administered medications on file prior to visit.     No Known Allergies  Family History  Problem Relation Age of Onset  . Hypertension Mother   . Diabetes Mother   . Cancer Father   . Coronary artery disease Maternal Grandfather   . Hypertension Other        maternal side  . Colon cancer Neg Hx   . Prostate cancer Neg Hx   . Esophageal cancer Neg Hx   . Liver cancer Neg Hx   . Pancreatic cancer Neg Hx   . Stomach cancer Neg Hx   . Rectal cancer Neg Hx     BP 104/68 (BP Location: Left Arm, Patient Position: Sitting, Cuff Size: Large)   Pulse 91   Ht 5\' 11"  (1.803 m)   Wt 216 lb 6.4 oz (98.2 kg)   SpO2 95%   BMI 30.18 kg/m    Review of Systems He has belching but not n/v.      Objective:   Physical Exam VITAL SIGNS:  See vs page GENERAL: no distress Pulses: dorsalis pedis intact bilat.   MSK: no deformity of the feet CV: no leg edema, but there are bilat vv's.   Skin:  no ulcer on the feet.  normal color and temp on the feet. Neuro: sensation is intact to touch on the feet.    Lab Results  Component Value Date   HGBA1C 7.0 (A) 04/23/2019   Lab Results  Component Value Date   CREATININE 1.11 03/14/2019   BUN 20 03/14/2019   NA 138 03/14/2019   K 4.5 03/14/2019   CL 103 03/14/2019   CO2 26 03/14/2019       Assessment & Plan:  Type 2 DM: he needs increased rx, if it can be done with a  regimen that avoids or minimizes hypoglycemia. Belching, prob due to Ozempic.  We discussed.  We'll continue the  same Ozempic, but we can't increase.  Patient Instructions  check your blood sugar once a day.  vary the time of day when you check, between before the 3 meals, and at bedtime.  also check if you have symptoms of your blood sugar being too high or too low.  please keep a record of the readings and bring it to your next appointment here (or you can bring the meter itself).  You can write it on any piece of paper.  please call us sooner if your blood sugar goes below 70, or if you have a lot of readings over 200.   I have sent a prescription to your pharmacy, to increase the invokana. Please continue the same other diabetes medications. Please come back for a follow-up appointment in 3 months.

## 2019-04-23 NOTE — Patient Instructions (Addendum)
check your blood sugar once a day.  vary the time of day when you check, between before the 3 meals, and at bedtime.  also check if you have symptoms of your blood sugar being too high or too low.  please keep a record of the readings and bring it to your next appointment here (or you can bring the meter itself).  You can write it on any piece of paper.  please call us sooner if your blood sugar goes below 70, or if you have a lot of readings over 200.   I have sent a prescription to your pharmacy, to increase the invokana. Please continue the same other diabetes medications. Please come back for a follow-up appointment in 3 months.

## 2019-04-27 ENCOUNTER — Encounter: Payer: Self-pay | Admitting: Internal Medicine

## 2019-04-27 ENCOUNTER — Other Ambulatory Visit: Payer: Self-pay

## 2019-04-27 ENCOUNTER — Ambulatory Visit (INDEPENDENT_AMBULATORY_CARE_PROVIDER_SITE_OTHER): Payer: 59 | Admitting: Internal Medicine

## 2019-04-27 VITALS — BP 128/80 | HR 75 | Ht 71.0 in | Wt 220.0 lb

## 2019-04-27 DIAGNOSIS — I350 Nonrheumatic aortic (valve) stenosis: Secondary | ICD-10-CM | POA: Insufficient documentation

## 2019-04-27 DIAGNOSIS — I10 Essential (primary) hypertension: Secondary | ICD-10-CM

## 2019-04-27 DIAGNOSIS — Q231 Congenital insufficiency of aortic valve: Secondary | ICD-10-CM | POA: Diagnosis not present

## 2019-04-27 DIAGNOSIS — I712 Thoracic aortic aneurysm, without rupture, unspecified: Secondary | ICD-10-CM

## 2019-04-27 DIAGNOSIS — E785 Hyperlipidemia, unspecified: Secondary | ICD-10-CM

## 2019-04-27 MED ORDER — LISINOPRIL 10 MG PO TABS: 10.0000 mg | ORAL_TABLET | Freq: Two times a day (BID) | ORAL | 3 refills | Status: DC
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
## 2019-04-27 NOTE — Patient Instructions (Signed)
Medication Instructions:  Your physician has recommended you make the following change in your medication:  1- CHANGE Lisinopril to 10 mg by mouth two times a day. If you find your blood pressure is trending back up, you may go back to your previous schedule of 10 mg in the morning and 20 mg in the evening.   If you need a refill on your cardiac medications before your next appointment, please call your pharmacy.   Lab work: - None ordered.  If you have labs (blood work) drawn today and your tests are completely normal, you will receive your results only by: [name] MyChart Message (if you have MyChart) OR . A paper copy in the mail If you have any lab test that is abnormal or we need to change your treatment, we will call you to review the results.  Testing/Procedures: Your physician recommends you have a repeat MRA chest in one year to evaluate the thoracic aortic aneurysm. Please call us to schedule in one year closer to the time of the test (July 2021). You will need lab work within 30 days of the procedure.    Follow-Up: At [facility], you and your health needs are our priority.  As part of our continuing mission to provide you with exceptional heart care, we have created designated Provider Care Teams.  These Care Teams include your primary Cardiologist (physician) and Advanced Practice Providers (APPs -  Physician Assistants and Nurse Practitioners) who all work together to provide you with the care you need, when you need it. You will need a follow up appointment in 12 months.   Please call our office 2 months in advance to schedule this appointment.  You may see Nelva Bush, MD or one of the following Advanced Practice Providers on your designated Care Team:   Murray Hodgkins, NP Christell Faith, PA-C . Marrianne Mood, PA-C

## 2019-05-24 ENCOUNTER — Ambulatory Visit (INDEPENDENT_AMBULATORY_CARE_PROVIDER_SITE_OTHER): Payer: 59

## 2019-05-24 DIAGNOSIS — Z23 Encounter for immunization: Secondary | ICD-10-CM | POA: Diagnosis not present

## 2019-06-03 ENCOUNTER — Other Ambulatory Visit: Payer: Self-pay | Admitting: Internal Medicine

## 2019-06-07 ENCOUNTER — Other Ambulatory Visit: Payer: Self-pay | Admitting: Internal Medicine

## 2019-06-07 ENCOUNTER — Other Ambulatory Visit: Payer: Self-pay | Admitting: Endocrinology

## 2019-06-08 ENCOUNTER — Other Ambulatory Visit: Payer: Self-pay | Admitting: Endocrinology

## 2019-07-18 ENCOUNTER — Other Ambulatory Visit: Payer: Self-pay

## 2019-07-20 ENCOUNTER — Encounter: Payer: Self-pay | Admitting: Endocrinology

## 2019-07-20 ENCOUNTER — Ambulatory Visit: Payer: 59 | Admitting: Endocrinology

## 2019-07-20 ENCOUNTER — Other Ambulatory Visit: Payer: Self-pay

## 2019-07-20 VITALS — BP 126/72 | HR 86 | Ht 71.0 in | Wt 212.8 lb

## 2019-07-20 DIAGNOSIS — E119 Type 2 diabetes mellitus without complications: Secondary | ICD-10-CM

## 2019-07-20 LAB — POCT GLYCOSYLATED HEMOGLOBIN (HGB A1C): Hemoglobin A1C: 6.7 % — AB (ref 4.0–5.6)

## 2019-07-20 MED ORDER — CANAGLIFLOZIN 300 MG PO TABS: ORAL_TABLET | ORAL | 11 refills | Status: DC
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
## 2019-07-20 NOTE — Patient Instructions (Addendum)
check your blood sugar once a day.  vary the time of day when you check, between before the 3 meals, and at bedtime.  also check if you have symptoms of your blood sugar being too high or too low.  please keep a record of the readings and bring it to your next appointment here (or you can bring the meter itself).  You can write it on any piece of paper.  please call us sooner if your blood sugar goes below 70, or if you have a lot of readings over 200.   Please continue the same diabetes medications.  Please come back for a follow-up appointment in 3 months.  

## 2019-07-21 DIAGNOSIS — E119 Type 2 diabetes mellitus without complications: Secondary | ICD-10-CM | POA: Insufficient documentation

## 2019-09-03 ENCOUNTER — Other Ambulatory Visit: Payer: Self-pay | Admitting: Endocrinology

## 2019-09-04 ENCOUNTER — Telehealth: Payer: Self-pay

## 2019-09-04 NOTE — Telephone Encounter (Signed)
PRIOR AUTHORIZATION  PA initiation date: 09/04/19 but encountered an error as follows:  There was an error with your request  ? Patient not found   Insurance Company: [place] completed electronically through [employer] My Meds: Yes  Pt will need to provide updated insurance information in order to proceed with PA. Faxed PA request form BACK to [facility] advising them to provide updated insurance information for completion. This PA remains INCOMPLETE until pt can provide correct insurance info.

## 2019-09-17 ENCOUNTER — Other Ambulatory Visit: Payer: Self-pay | Admitting: Internal Medicine

## 2019-09-29 ENCOUNTER — Other Ambulatory Visit: Payer: Self-pay | Admitting: Endocrinology

## 2019-10-24 ENCOUNTER — Encounter: Payer: Self-pay | Admitting: Endocrinology

## 2019-10-24 ENCOUNTER — Ambulatory Visit: Payer: BC Managed Care – PPO | Admitting: Endocrinology

## 2019-10-24 ENCOUNTER — Other Ambulatory Visit: Payer: Self-pay

## 2019-10-24 VITALS — BP 104/70 | HR 135 | Ht 71.0 in | Wt 208.2 lb

## 2019-10-24 DIAGNOSIS — E119 Type 2 diabetes mellitus without complications: Secondary | ICD-10-CM

## 2019-10-24 LAB — POCT GLYCOSYLATED HEMOGLOBIN (HGB A1C): Hemoglobin A1C: 7 % — AB (ref 4.0–5.6)

## 2019-10-24 MED ORDER — CANAGLIFLOZIN 300 MG PO TABS: 300.0000 mg | ORAL_TABLET | Freq: Every day | ORAL | 3 refills | Status: DC

## 2019-10-24 NOTE — Patient Instructions (Addendum)
check your blood sugar once a day.  vary the time of day when you check, between before the 3 meals, and at bedtime.  also check if you have symptoms of your blood sugar being too high or too low.  please keep a record of the readings and bring it to your next appointment here (or you can bring the meter itself).  You can write it on any piece of paper.  please call us sooner if your blood sugar goes below 70, or if you have a lot of readings over 200.   I have sent a prescription to your pharmacy, to increase the Invokana to a whole pill. Please redo blood tests in 1-2 weeks.  Please call the office first.   Please continue the same other diabetes medications. Please come back for a follow-up appointment in 3 months.

## 2019-10-24 NOTE — Progress Notes (Signed)
Subjective:    Patient ID: Timothy Bernard, male    DOB: 19-Apr-1956, 64 y.o.   MRN: [id number]  HPI Pt returns for f/u of diabetes mellitus: DM type: 2 Dx'ed: 123456 Complications: TAA and renal insuff.  Therapy: Ozempic and 2 oral meds.   DKA: never Severe hypoglycemia: never Pancreatitis: never Pancreatic imaging: never Other: He did not tolerate Trulicity (n/d); this also limits Ozemic dosage.  he declines insulin, at least for now; Invokana dosage is limited by polyuria.   Interval history: He brings a record of his cbg's which I have reviewed today.  Fasting cbg varies from 108-182.  pt states he feels well in general.  He again tried to increase Ozempic, but this caused n/v/d.  Pt says this is situational  Past Medical History:  Diagnosis Date  . Adenomatous colon polyp 09/29/2007   41mm cecal adenomatous polyp 10 mm adenoama, post-polypectomy burn syndrome  . Ascending aortic aneurysm (HCC)    4.8 cm on ech (5/10), 4.6 x 4.6 cm by MRA 6/10 4.6 cm aortic root and ascending aorta by MRA 9/10. MRA chest (3/11) 4.5 ascending aorta. Stable by MRA 6/18  . Bicuspid aortic valve    echo (5/10) with bicuspid AoV (upon re-review), mild LV dilation, EF 50-55%, no aortic stenosis, mild AI, moderate ascending aortic dilation (4.8 cm). 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 Echo (3/11) bicuspid aortic valve with no AS but mild AI, mild LVH, EF 60%.    . Blood transfusion without reported diagnosis   . Cataract   . HTN (hypertension)   . Hyperlipidemia   . 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 OSA on CPAP     Past Surgical History:  Procedure Laterality Date  . COLONOSCOPY W/ BIOPSIES AND POLYPECTOMY  09/29/2007  . Mole removed      Social History   Socioeconomic History  . Marital status: Married    Spouse name: Not on file  . Number of children: Not on file  . Years of education: Not on file  . Highest education level: Not on file  Occupational History  .  Occupation: Therapist, sports: [employer]  Tobacco Use  . Smoking status: Never Smoker  . Smokeless tobacco: Never Used  Substance and Sexual Activity  . Alcohol use: No    Alcohol/week: 0.0 standard drinks  . Drug use: No  . Sexual activity: Not on file  Other Topics Concern  . Not on file  Social History Narrative   [place].    Married, 1 stepson       Pt signed designated party release form and gives Marla Loadholt, spouse [phone], access to medical records. Can leave msg on answering machine.    Social Determinants of Health   Financial Resource Strain:   . Difficulty of Paying Living Expenses: Not on file  Food Insecurity:   . Worried About Charity fundraiser in the Last Year: Not on file  . Ran Out of Food in the Last Year: Not on file  Transportation Needs:   . Lack of Transportation (Medical): Not on file  . Lack of Transportation (Non-Medical): Not on file  Physical Activity:   . Days of Exercise per Week: Not on file  . Minutes of Exercise per Session: Not on file  Stress:   . Feeling of Stress : Not on file  Social Connections:   . Frequency of Communication with Friends and Family: Not on file  . Frequency  of Social Gatherings with Friends and Family: Not on file  . Attends Religious Services: Not on file  . Active Member of Clubs or Organizations: Not on file  . Attends Archivist Meetings: Not on file  . Marital Status: Not on file  Intimate Partner Violence:   . Fear of Current or Ex-Partner: Not on file  . Emotionally Abused: Not on file  . Physically Abused: Not on file  . Sexually Abused: Not on file    Current Outpatient Medications on File Prior to Visit  Medication Sig Dispense Refill  . aspirin 81 MG tablet Take 81 mg by mouth daily.      . Flaxseed, Linseed, (FLAX SEED OIL) 1000 MG CAPS Take 1 capsule by mouth daily.     . Lancets (ONETOUCH ULTRASOFT) lancets 1 each by Other route daily. Dx:250.00 100  each 11  . metFORMIN (GLUCOPHAGE-XR) 500 MG 24 hr tablet TAKE 4 TABLETS BY MOUTH DAILY WITH BREAKFAST 120 tablet 11  . metoprolol succinate (TOPROL-XL) 50 MG 24 hr tablet TAKE 1 TABLET BY MOUTH  DAILY WITH OR IMMEDIATLEY  FOLLOWING A MEAL 90 tablet 3  . ONETOUCH ULTRA test strip USE TO TEST BLOOD SUGAR ONCE DAILY E11.65 50 strip 7  . OZEMPIC, 0.25 OR 0.5 MG/DOSE, 2 MG/1.5ML SOPN INJECT 0.25 MG INTO THE SKIN ONCE A WEEK. 1 pen 11  . rosuvastatin (CRESTOR) 5 MG tablet TAKE 1 TABLET BY MOUTH  DAILY 90 tablet 3  . tadalafil (CIALIS) 20 MG tablet TAKE 1/2 OR 1 TABLET BY MOUTH EVERY 3 DAYS AS NEEDED FOR ERECTILE DYSFUNCTION 5 tablet 2  . lisinopril (ZESTRIL) 10 MG tablet Take 1 tablet (10 mg total) by mouth 2 (two) times daily. 180 tablet 3   No current facility-administered medications on file prior to visit.    No Known Allergies  Family History  Problem Relation Age of Onset  . Hypertension Mother   . Diabetes Mother   . Cancer Father   . Coronary artery disease Maternal Grandfather   . Hypertension Other        maternal side  . Colon cancer Neg Hx   . Prostate cancer Neg Hx   . Esophageal cancer Neg Hx   . Liver cancer Neg Hx   . Pancreatic cancer Neg Hx   . Stomach cancer Neg Hx   . Rectal cancer Neg Hx     BP 104/70   Pulse (!) 135   Ht 5\' 11"  (1.803 m)   Wt 208 lb 3.2 oz (94.4 kg)   SpO2 95%   BMI 29.04 kg/m    Review of Systems He denies hypoglycemia.      Objective:   Physical Exam VITAL SIGNS:  See vs page GENERAL: no distress Pulses: dorsalis pedis intact bilat.   MSK: no deformity of the feet CV: no leg edema, but there are bilat vv's.   Skin:  no ulcer on the feet.  normal color and temp on the feet.   Neuro: sensation is intact to touch on the feet.    A1c=7.0%  Lab Results  Component Value Date   CREATININE 1.11 03/14/2019   BUN 20 03/14/2019   NA 138 03/14/2019   K 4.5 03/14/2019   CL 103 03/14/2019   CO2 26 03/14/2019   Lab Results    Component Value Date   TSH 0.99 12/22/2018       Assessment & Plan:  Type 2 DM: worse N/v/d: This limits rx options and  dosages VV's: these are a relative contraindication to pioglitazone Tachycardia, situational.   Patient Instructions  check your blood sugar once a day.  vary the time of day when you check, between before the 3 meals, and at bedtime.  also check if you have symptoms of your blood sugar being too high or too low.  please keep a record of the readings and bring it to your next appointment here (or you can bring the meter itself).  You can write it on any piece of paper.  please call us sooner if your blood sugar goes below 70, or if you have a lot of readings over 200.   I have sent a prescription to your pharmacy, to increase the Invokana to a whole pill. Please redo blood tests in 1-2 weeks.  Please call the office first.   Please continue the same other diabetes medications. Please come back for a follow-up appointment in 3 months.

## 2019-11-06 DIAGNOSIS — E119 Type 2 diabetes mellitus without complications: Secondary | ICD-10-CM | POA: Diagnosis not present

## 2019-11-06 DIAGNOSIS — H2513 Age-related nuclear cataract, bilateral: Secondary | ICD-10-CM | POA: Diagnosis not present

## 2019-11-06 DIAGNOSIS — H0100A Unspecified blepharitis right eye, upper and lower eyelids: Secondary | ICD-10-CM | POA: Diagnosis not present

## 2019-11-06 DIAGNOSIS — H40052 Ocular hypertension, left eye: Secondary | ICD-10-CM | POA: Diagnosis not present

## 2019-11-06 DIAGNOSIS — H35033 Hypertensive retinopathy, bilateral: Secondary | ICD-10-CM | POA: Diagnosis not present

## 2019-11-23 ENCOUNTER — Other Ambulatory Visit: Payer: Self-pay

## 2019-11-23 ENCOUNTER — Other Ambulatory Visit (INDEPENDENT_AMBULATORY_CARE_PROVIDER_SITE_OTHER): Payer: BC Managed Care – PPO

## 2019-11-23 DIAGNOSIS — E119 Type 2 diabetes mellitus without complications: Secondary | ICD-10-CM | POA: Diagnosis not present

## 2019-11-23 LAB — BASIC METABOLIC PANEL
BUN: 15 mg/dL (ref 6–23)
CO2: 24 mEq/L (ref 19–32)
Calcium: 8.8 mg/dL (ref 8.4–10.5)
Chloride: 108 mEq/L (ref 96–112)
Creatinine, Ser: 1.09 mg/dL (ref 0.40–1.50)
GFR: 68.14 mL/min (ref >60.00)
Glucose, Bld: 165 mg/dL — ABNORMAL HIGH (ref 70–99)
Potassium: 4.1 mEq/L (ref 3.5–5.1)
Sodium: 139 mEq/L (ref 135–145)

## 2019-11-23 LAB — TSH: TSH: 1.17 u[IU]/mL (ref 0.35–4.50)

## 2019-11-28 DIAGNOSIS — H35033 Hypertensive retinopathy, bilateral: Secondary | ICD-10-CM | POA: Diagnosis not present

## 2019-11-28 DIAGNOSIS — D23111 Other benign neoplasm of skin of right upper eyelid, including canthus: Secondary | ICD-10-CM | POA: Diagnosis not present

## 2019-11-28 DIAGNOSIS — H0100A Unspecified blepharitis right eye, upper and lower eyelids: Secondary | ICD-10-CM | POA: Diagnosis not present

## 2019-11-28 DIAGNOSIS — H40052 Ocular hypertension, left eye: Secondary | ICD-10-CM | POA: Diagnosis not present

## 2019-12-31 DIAGNOSIS — H2512 Age-related nuclear cataract, left eye: Secondary | ICD-10-CM | POA: Diagnosis not present

## 2020-01-07 ENCOUNTER — Other Ambulatory Visit: Payer: Self-pay | Admitting: Internal Medicine

## 2020-01-15 DIAGNOSIS — H2512 Age-related nuclear cataract, left eye: Secondary | ICD-10-CM | POA: Diagnosis not present

## 2020-01-20 ENCOUNTER — Other Ambulatory Visit: Payer: Self-pay | Admitting: Endocrinology

## 2020-01-21 ENCOUNTER — Other Ambulatory Visit: Payer: Self-pay

## 2020-01-21 DIAGNOSIS — H2511 Age-related nuclear cataract, right eye: Secondary | ICD-10-CM | POA: Diagnosis not present

## 2020-01-23 ENCOUNTER — Other Ambulatory Visit: Payer: Self-pay

## 2020-01-23 ENCOUNTER — Encounter: Payer: Self-pay | Admitting: Endocrinology

## 2020-01-23 ENCOUNTER — Ambulatory Visit (INDEPENDENT_AMBULATORY_CARE_PROVIDER_SITE_OTHER): Payer: BC Managed Care – PPO | Admitting: Endocrinology

## 2020-01-23 VITALS — BP 124/86 | HR 81 | Ht 71.0 in | Wt 212.0 lb

## 2020-01-23 DIAGNOSIS — N529 Male erectile dysfunction, unspecified: Secondary | ICD-10-CM | POA: Diagnosis not present

## 2020-01-23 DIAGNOSIS — E119 Type 2 diabetes mellitus without complications: Secondary | ICD-10-CM | POA: Diagnosis not present

## 2020-01-23 LAB — POCT GLYCOSYLATED HEMOGLOBIN (HGB A1C): Hemoglobin A1C: 7.1 % — AB (ref 4.0–5.6)

## 2020-01-23 MED ORDER — DAPAGLIFLOZIN PROPANEDIOL 10 MG PO TABS
5.0000 mg | ORAL_TABLET | Freq: Every day | ORAL | 3 refills | Status: DC
Start: 2020-01-23 — End: 2020-04-29

## 2020-01-23 MED ORDER — REPAGLINIDE 0.5 MG PO TABS: 0.5000 mg | ORAL_TABLET | Freq: Every day | ORAL | 3 refills | Status: DC

## 2020-01-23 NOTE — Progress Notes (Signed)
Subjective:    Patient ID: Timothy Bernard, male    DOB: [birth year], 64 y.o.   MRN: [id number]  HPI Pt returns for f/u of diabetes mellitus: DM type: 2 Dx'ed: 123456 Complications: TAA and renal insuff.  Therapy: Ozempic and 2 oral meds.   DKA: never Severe hypoglycemia: never Pancreatitis: never Pancreatic imaging: never Other: He did not tolerate Trulicity (n/d); this also limits Ozemic dosage.  he declines insulin, at least for now; Invokana dosage is limited by polyuria.   Interval history: He brings a record of his cbg's which I have reviewed today.  Fasting cbg varies from 128-173.  pt states he feels well in general.  He again tried to increase Ozempic, but this caused n/v/d.   Past Medical History:  Diagnosis Date  . Adenomatous colon polyp 09/29/2007   23mm cecal adenomatous polyp 10 mm adenoama, post-polypectomy burn syndrome  . Ascending aortic aneurysm (HCC)    4.8 cm on ech (5/10), 4.6 x 4.6 cm by MRA 6/10 4.6 cm aortic root and ascending aorta by MRA 9/10. MRA chest (3/11) 4.5 ascending aorta. Stable by MRA 6/18  . Bicuspid aortic valve    echo (5/10) with bicuspid AoV (upon re-review), mild LV dilation, EF 50-55%, no aortic stenosis, mild AI, moderate ascending aortic dilation (4.8 cm). 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 Echo (3/11) bicuspid aortic valve with no AS but mild AI, mild LVH, EF 60%.    . Blood transfusion without reported diagnosis   . Cataract   . HTN (hypertension)   . Hyperlipidemia   . 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 OSA on CPAP     Past Surgical History:  Procedure Laterality Date  . COLONOSCOPY W/ BIOPSIES AND POLYPECTOMY  09/29/2007  . Mole removed      Social History   Socioeconomic History  . Marital status: Married    Spouse name: Not on file  . Number of children: Not on file  . Years of education: Not on file  . Highest education level: Not on file  Occupational History  . Occupation: Estate agent: [place]  Tobacco Use  . Smoking status: Never Smoker  . Smokeless tobacco: Never Used  Substance and Sexual Activity  . Alcohol use: No    Alcohol/week: 0.0 standard drinks  . Drug use: No  . Sexual activity: Not on file  Other Topics Concern  . Not on file  Social History Narrative   [place].    Married, 1 stepson       Pt signed designated party release form and gives Adonay Pogany, spouse [phone], access to medical records. Can leave msg on answering machine.    Social Determinants of Health   Financial Resource Strain:   . Difficulty of Paying Living Expenses:   Food Insecurity:   . Worried About Charity fundraiser in the Last Year:   . Arboriculturist in the Last Year:   Transportation Needs:   . Film/video editor (Medical):   [name] Lack of Transportation (Non-Medical):   Physical Activity:   . Days of Exercise per Week:   . Minutes of Exercise per Session:   Stress:   . Feeling of Stress :   Social Connections:   . Frequency of Communication with Friends and Family:   . Frequency of Social Gatherings with Friends and Family:   . Attends Religious Services:   . Active Member of Clubs or Organizations:   .  Attends Archivist Meetings:   [name] Marital Status:   Intimate Partner Violence:   . Fear of Current or Ex-Partner:   . Emotionally Abused:   [name] Physically Abused:   . Sexually Abused:     Current Outpatient Medications on File Prior to Visit  Medication Sig Dispense Refill  . aspirin 81 MG tablet Take 81 mg by mouth daily.      . Flaxseed, Linseed, (FLAX SEED OIL) 1000 MG CAPS Take 1 capsule by mouth daily.     . Lancets (ONETOUCH ULTRASOFT) lancets 1 each by Other route daily. Dx:250.00 100 each 11  . metFORMIN (GLUCOPHAGE-XR) 500 MG 24 hr tablet TAKE 4 TABLETS BY MOUTH DAILY WITH BREAKFAST 120 tablet 11  . metoprolol succinate (TOPROL-XL) 50 MG 24 hr tablet TAKE 1 TABLET BY MOUTH  DAILY WITH OR IMMEDIATLEY  FOLLOWING A  MEAL 90 tablet 3  . ONETOUCH ULTRA test strip USE TO TEST BLOOD SUGAR ONCE DAILY E11.65 50 strip 7  . OZEMPIC, 0.25 OR 0.5 MG/DOSE, 2 MG/1.5ML SOPN INJECT 0.25 MG INTO THE SKIN ONCE A WEEK. 1 pen 11  . rosuvastatin (CRESTOR) 5 MG tablet TAKE 1 TABLET BY MOUTH  DAILY 90 tablet 3  . tadalafil (CIALIS) 20 MG tablet TAKE 1/2 OR 1 TABLET BY MOUTH EVERY 3 DAYS AS NEEDED FOR ERECTILE DYSFUNCTION 4 tablet 11  . lisinopril (ZESTRIL) 10 MG tablet Take 1 tablet (10 mg total) by mouth 2 (two) times daily. 180 tablet 3   No current facility-administered medications on file prior to visit.    No Known Allergies  Family History  Problem Relation Age of Onset  . Hypertension Mother   . Diabetes Mother   . Cancer Father   . Coronary artery disease Maternal Grandfather   . Hypertension Other        maternal side  . Colon cancer Neg Hx   . Prostate cancer Neg Hx   . Esophageal cancer Neg Hx   . Liver cancer Neg Hx   . Pancreatic cancer Neg Hx   . Stomach cancer Neg Hx   . Rectal cancer Neg Hx     BP 124/86   Pulse 81   Ht 5\' 11"  (1.803 m)   Wt 212 lb (96.2 kg)   SpO2 96%   BMI 29.57 kg/m    Review of Systems Nausea is mild.  cialis does not help ED sxs.     Objective:   Physical Exam VITAL SIGNS:  See vs page GENERAL: no distress Pulses: dorsalis pedis intact bilat.   MSK: no deformity of the feet CV: no leg edema, but there are bilat vv's Skin:  no ulcer on the feet.  normal color and temp on the feet. Neuro: sensation is intact to touch on the feet.    Lab Results  Component Value Date   HGBA1C 7.1 (A) 01/23/2020   Lab Results  Component Value Date   CREATININE 1.09 11/23/2019   BUN 15 11/23/2019   NA 139 11/23/2019   K 4.1 11/23/2019   CL 108 11/23/2019   CO2 24 11/23/2019   Lab Results  Component Value Date   TESTOSTERONE 734 12/22/2018       Assessment & Plan:  Type 2 DM, with CRI: worse.  Nausea, due to Ozempic. ED: persistent  Patient Instructions    check your blood sugar once a day.  vary the time of day when you check, between before the 3 meals, and at bedtime.  also check if you have symptoms of your blood sugar being too high or too low.  please keep a record of the readings and bring it to your next appointment here (or you can bring the meter itself).  You can write it on any piece of paper.  please call us sooner if your blood sugar goes below 70, or if you have a lot of readings over 200.   I have sent 2 prescriptions to your pharmacy: to add repaglinide, and to change the Invokana to "[place]." Please continue the same other diabetes medications. Please see a urology specialist.  you will receive a phone call, about a day and time for an appointment Please come back for a follow-up appointment in 3 months.

## 2020-01-23 NOTE — Patient Instructions (Addendum)
check your blood sugar once a day.  vary the time of day when you check, between before the 3 meals, and at bedtime.  also check if you have symptoms of your blood sugar being too high or too low.  please keep a record of the readings and bring it to your next appointment here (or you can bring the meter itself).  You can write it on any piece of paper.  please call us sooner if your blood sugar goes below 70, or if you have a lot of readings over 200.   I have sent 2 prescriptions to your pharmacy: to add repaglinide, and to change the Invokana to "[place]." Please continue the same other diabetes medications. Please see a urology specialist.  you will receive a phone call, about a day and time for an appointment Please come back for a follow-up appointment in 3 months.

## 2020-02-05 DIAGNOSIS — H2511 Age-related nuclear cataract, right eye: Secondary | ICD-10-CM | POA: Diagnosis not present

## 2020-02-14 ENCOUNTER — Telehealth: Payer: Self-pay | Admitting: Internal Medicine

## 2020-02-14 NOTE — Telephone Encounter (Signed)
As long as new symptoms have not developed since last year, I recommend proceeding with MRA as scheduled next month and deferring repeat echo for another year, as documented in last year's office note.  If dyspnea, chest pain, dizziness, or increasing fatigue have developed, it would be reasonable to obtain an echo before our visit this summer.  Nelva Bush, MD [facility]

## 2020-02-14 NOTE — Telephone Encounter (Signed)
From OV 03/2019 with Dr End: "ASSESSMENT AND PLAN: Bicuspid aortic valve with mild stenosis: No signs or symptoms to suggest rapidly progressive AS.  Recent echo showed a mild gradient.  We will continue clinical follow-up and consider repeating an echo in 2 years."   Will route to Dr End for advice if echo needed or not prior to appointment.

## 2020-02-14 NOTE — Telephone Encounter (Signed)
Patients wife calling in stating it is time for her husband to have his MRA and Echo. Patients wife was given the number for scheduling for the MRA. Patient does not have an order placed for an ECHO  Please advise

## 2020-02-15 NOTE — Telephone Encounter (Signed)
Called and spoke with patient's wife. She verbalized understanding of Dr Darnelle Bos recommendations. Offered sooner appointment with an APP. SHe refused as patient prefers appt with Dr End. First available with Dr End is October 6th. appointment made for 06/04/20 with Dr End. Wife will discuss with patient and call back next week to reschedule with an APP if he agrees.

## 2020-03-14 ENCOUNTER — Encounter: Payer: Self-pay | Admitting: Internal Medicine

## 2020-03-14 ENCOUNTER — Other Ambulatory Visit: Payer: Self-pay

## 2020-03-14 ENCOUNTER — Ambulatory Visit (INDEPENDENT_AMBULATORY_CARE_PROVIDER_SITE_OTHER): Payer: BC Managed Care – PPO | Admitting: Internal Medicine

## 2020-03-14 VITALS — BP 120/84 | HR 73 | Temp 97.7°F | Ht 71.0 in | Wt 216.0 lb

## 2020-03-14 DIAGNOSIS — I10 Essential (primary) hypertension: Secondary | ICD-10-CM | POA: Diagnosis not present

## 2020-03-14 DIAGNOSIS — E1159 Type 2 diabetes mellitus with other circulatory complications: Secondary | ICD-10-CM

## 2020-03-14 DIAGNOSIS — Z0000 Encounter for general adult medical examination without abnormal findings: Secondary | ICD-10-CM

## 2020-03-14 DIAGNOSIS — I712 Thoracic aortic aneurysm, without rupture, unspecified: Secondary | ICD-10-CM

## 2020-03-14 DIAGNOSIS — Z125 Encounter for screening for malignant neoplasm of prostate: Secondary | ICD-10-CM | POA: Diagnosis not present

## 2020-03-14 LAB — LIPID PANEL
Cholesterol: 105 mg/dL (ref 0–200)
HDL: 34.8 mg/dL — ABNORMAL LOW (ref >39.00)
LDL Cholesterol: 42 mg/dL (ref 0–99)
NonHDL: 70.4
Total CHOL/HDL Ratio: 3
Triglycerides: 140 mg/dL (ref 0.0–149.0)
VLDL: 28 mg/dL (ref 0.0–40.0)

## 2020-03-14 LAB — COMPREHENSIVE METABOLIC PANEL
ALT: 22 U/L (ref 0–53)
AST: 19 U/L (ref 0–37)
Albumin: 4.2 g/dL (ref 3.5–5.2)
Alkaline Phosphatase: 71 U/L (ref 39–117)
BUN: 13 mg/dL (ref 6–23)
CO2: 27 mEq/L (ref 19–32)
Calcium: 9.5 mg/dL (ref 8.4–10.5)
Chloride: 105 mEq/L (ref 96–112)
Creatinine, Ser: 1.25 mg/dL (ref 0.40–1.50)
GFR: 58.12 mL/min — ABNORMAL LOW (ref >60.00)
Glucose, Bld: 150 mg/dL — ABNORMAL HIGH (ref 70–99)
Potassium: 4.5 mEq/L (ref 3.5–5.1)
Sodium: 139 mEq/L (ref 135–145)
Total Bilirubin: 0.4 mg/dL (ref 0.2–1.2)
Total Protein: 6.8 g/dL (ref 6.0–8.3)

## 2020-03-14 LAB — CBC
HCT: 45.7 % (ref 39.0–52.0)
Hemoglobin: 15 g/dL (ref 13.0–17.0)
MCHC: 32.9 g/dL (ref 30.0–36.0)
MCV: 92.7 fl (ref 78.0–100.0)
Platelets: 256 10*3/uL (ref 150.0–400.0)
RBC: 4.93 Mil/uL (ref 4.22–5.81)
RDW: 14.1 % (ref 11.5–15.5)
WBC: 7.8 10*3/uL (ref 4.0–10.5)

## 2020-03-14 LAB — PSA: PSA: 1.93 ng/mL (ref 0.10–4.00)

## 2020-03-14 LAB — HM DIABETES FOOT EXAM

## 2020-03-14 MED ORDER — METOPROLOL SUCCINATE ER 50 MG PO TB24: ORAL_TABLET | ORAL | 3 refills | Status: DC

## 2020-03-14 MED ORDER — ROSUVASTATIN CALCIUM 5 MG PO TABS: 5.0000 mg | ORAL_TABLET | Freq: Every day | ORAL | 3 refills | Status: DC

## 2020-03-14 MED ORDER — LISINOPRIL 10 MG PO TABS: 10.0000 mg | ORAL_TABLET | Freq: Two times a day (BID) | ORAL | 3 refills | Status: DC

## 2020-03-14 NOTE — Assessment & Plan Note (Signed)
Healthy Will get back to exercise after recovery from cataract surgery Flu vaccine in the fall Pneumovax booster next year Colon 2024 Will recheck PSA since going to urology

## 2020-03-14 NOTE — Assessment & Plan Note (Signed)
Getting yearly checks

## 2020-03-14 NOTE — Progress Notes (Signed)
Subjective:    Patient ID: Timothy Bernard, male    DOB: 1956-08-11, 64 y.o.   MRN: [id number]  HPI Here for physical This visit occurred during the SARS-CoV-2 public health emergency.  Safety protocols were in place, including screening questions prior to the visit, additional usage of staff PPE, and extensive cleaning of exam room while observing appropriate contact time as indicated for disinfecting solutions.   Had cataracts done in the past year Last exam in March  Diabetes controlled currently No foot numbness, pain or sores No hypoglycemic reactions Weight seems to have leveled out  Continues to have yearly follow up of aortic aneursym MRA scheduled next week No recent exercise---due to recent cataract surgery  Current Outpatient Medications on File Prior to Visit  Medication Sig Dispense Refill  . aspirin 81 MG tablet Take 81 mg by mouth daily.      . dapagliflozin propanediol (FARXIGA) 10 MG TABS tablet Take 1 tablet (10 mg total) by mouth daily before breakfast. 45 tablet 3  . Flaxseed, Linseed, (FLAX SEED OIL) 1000 MG CAPS Take 1 capsule by mouth daily.     . Lancets (ONETOUCH ULTRASOFT) lancets 1 each by Other route daily. Dx:250.00 100 each 11  . metFORMIN (GLUCOPHAGE-XR) 500 MG 24 hr tablet TAKE 4 TABLETS BY MOUTH DAILY WITH BREAKFAST 120 tablet 11  . metoprolol succinate (TOPROL-XL) 50 MG 24 hr tablet TAKE 1 TABLET BY MOUTH  DAILY WITH OR IMMEDIATLEY  FOLLOWING A MEAL 90 tablet 3  . ONETOUCH ULTRA test strip USE TO TEST BLOOD SUGAR ONCE DAILY E11.65 50 strip 7  . OZEMPIC, 0.25 OR 0.5 MG/DOSE, 2 MG/1.5ML SOPN INJECT 0.25 MG INTO THE SKIN ONCE A WEEK. 1 pen 11  . repaglinide (PRANDIN) 0.5 MG tablet Take 1 tablet (0.5 mg total) by mouth daily with supper. 90 tablet 3  . rosuvastatin (CRESTOR) 5 MG tablet TAKE 1 TABLET BY MOUTH  DAILY 90 tablet 3  . tadalafil (CIALIS) 20 MG tablet TAKE 1/2 OR 1 TABLET BY MOUTH EVERY 3 DAYS AS NEEDED FOR ERECTILE DYSFUNCTION 4 tablet 11    . lisinopril (ZESTRIL) 10 MG tablet Take 1 tablet (10 mg total) by mouth 2 (two) times daily. 180 tablet 3   No current facility-administered medications on file prior to visit.    No Known Allergies  Past Medical History:  Diagnosis Date  . Adenomatous colon polyp 09/29/2007   66mm cecal adenomatous polyp 10 mm adenoama, post-polypectomy burn syndrome  . Ascending aortic aneurysm (HCC)    4.8 cm on ech (5/10), 4.6 x 4.6 cm by MRA 6/10 4.6 cm aortic root and ascending aorta by MRA 9/10. MRA chest (3/11) 4.5 ascending aorta. Stable by MRA 6/18  . Bicuspid aortic valve    echo (5/10) with bicuspid AoV (upon re-review), mild LV dilation, EF 50-55%, no aortic stenosis, mild AI, moderate ascending aortic dilation (4.8 cm). 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 Echo (3/11) bicuspid aortic valve with no AS but mild AI, mild LVH, EF 60%.    . Blood transfusion without reported diagnosis   . Cataract   . HTN (hypertension)   . Hyperlipidemia   . 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 OSA on CPAP     Past Surgical History:  Procedure Laterality Date  . COLONOSCOPY W/ BIOPSIES AND POLYPECTOMY  09/29/2007  . Mole removed      Family History  Problem Relation Age of Onset  . Hypertension Mother   . Diabetes Mother   .  Cancer Father   . Coronary artery disease Maternal Grandfather   . Hypertension Other        maternal side  . Colon cancer Neg Hx   . Prostate cancer Neg Hx   . Esophageal cancer Neg Hx   . Liver cancer Neg Hx   . Pancreatic cancer Neg Hx   . Stomach cancer Neg Hx   . Rectal cancer Neg Hx     Social History   Socioeconomic History  . Marital status: Married    Spouse name: Not on file  . Number of children: Not on file  . Years of education: Not on file  . Highest education level: Not on file  Occupational History  . Occupation: Therapist, sports: [employer]  Tobacco Use  . Smoking status: Never Smoker  . Smokeless tobacco:  Never Used  Vaping Use  . Vaping Use: Never used  Substance and Sexual Activity  . Alcohol use: No    Alcohol/week: 0.0 standard drinks  . Drug use: No  . Sexual activity: Not on file  Other Topics Concern  . Not on file  Social History Narrative   [place]    Married, 1 stepson       Pt signed designated party release form and gives Timothy Bernard, spouse [phone], access to medical records. Can leave msg on answering machine.    Social Determinants of Health   Financial Resource Strain:   . Difficulty of Paying Living Expenses:   Food Insecurity:   . Worried About Charity fundraiser in the Last Year:   . Arboriculturist in the Last Year:   Transportation Needs:   . Film/video editor (Medical):   [name] Lack of Transportation (Non-Medical):   Physical Activity:   . Days of Exercise per Week:   . Minutes of Exercise per Session:   Stress:   . Feeling of Stress :   Social Connections:   . Frequency of Communication with Friends and Family:   . Frequency of Social Gatherings with Friends and Family:   . Attends Religious Services:   . Active Member of Clubs or Organizations:   . Attends Archivist Meetings:   [name] Marital Status:   Intimate Partner Violence:   . Fear of Current or Ex-Partner:   . Emotionally Abused:   [name] Physically Abused:   . Sexually Abused:    Review of Systems  Constitutional: Negative for fatigue and unexpected weight change.       Wears seat belt  HENT: Negative for dental problem, hearing loss and tinnitus.        Keeps up with dentist  Eyes: Negative for visual disturbance.  Respiratory: Negative for cough, chest tightness and shortness of breath.   Cardiovascular: Negative for chest pain, palpitations and leg swelling.  Gastrointestinal: Negative for abdominal pain, blood in stool and constipation.       No heartburn  Endocrine: Negative for polydipsia and polyuria.  Genitourinary:       Not satisfied with cialis--going  to urologist to consider alternatives  Musculoskeletal: Negative for arthralgias, back pain and joint swelling.  Skin: Negative for rash.       No suspicious lesions  Allergic/Immunologic: Negative for environmental allergies and immunocompromised state.  Neurological: Negative for dizziness, syncope, light-headedness and headaches.  Hematological: Negative for adenopathy. Does not bruise/bleed easily.  Psychiatric/Behavioral: Negative for sleep disturbance.       Doesn't use CPAP now--sleeping well  Objective:   Physical Exam Constitutional:      General: He is not in acute distress.    Appearance: Normal appearance.  HENT:     Head: Normocephalic and atraumatic.     Right Ear: Tympanic membrane and ear canal normal.     Left Ear: Tympanic membrane and ear canal normal.     Mouth/Throat:     Mouth: Mucous membranes are moist.     Comments: No lesions Eyes:     Conjunctiva/sclera: Conjunctivae normal.     Pupils: Pupils are equal, round, and reactive to light.  Cardiovascular:     Rate and Rhythm: Normal rate and regular rhythm.     Pulses: Normal pulses.     Heart sounds: No murmur heard.  No gallop.   Pulmonary:     Effort: Pulmonary effort is normal.     Breath sounds: Normal breath sounds. No wheezing or rales.  Abdominal:     Palpations: Abdomen is soft.     Tenderness: There is no abdominal tenderness.  Musculoskeletal:     Cervical back: Neck supple.     Right lower leg: No edema.     Left lower leg: No edema.  Lymphadenopathy:     Cervical: No cervical adenopathy.  Skin:    General: Skin is warm.     Findings: No rash.     Comments: No foot lesions  Neurological:     General: No focal deficit present.     Mental Status: He is alert and oriented to person, place, and time.     Comments: Normal sensation in feet  Psychiatric:        Mood and Affect: Mood normal.        Behavior: Behavior normal.            Assessment & Plan:

## 2020-03-14 NOTE — Assessment & Plan Note (Addendum)
BP Readings from Last 3 Encounters:  03/14/20 120/84  01/23/20 124/86  10/24/19 104/70   Good control on metoprolol and lisinopril

## 2020-03-14 NOTE — Assessment & Plan Note (Signed)
Lab Results  Component Value Date   HGBA1C 7.1 (A) 01/23/2020   Good control Working with Dr Loanne Drilling

## 2020-03-17 ENCOUNTER — Other Ambulatory Visit: Payer: Self-pay

## 2020-03-17 ENCOUNTER — Ambulatory Visit (HOSPITAL_COMMUNITY)
Admission: RE | Admit: 2020-03-17 | Discharge: 2020-03-17 | Disposition: A | Discharge status: Home / Self Care | Payer: BC Managed Care – PPO | Source: Ambulatory Visit | Attending: Internal Medicine | Admitting: Internal Medicine

## 2020-03-17 DIAGNOSIS — N5201 Erectile dysfunction due to arterial insufficiency: Secondary | ICD-10-CM | POA: Diagnosis not present

## 2020-03-17 DIAGNOSIS — I712 Thoracic aortic aneurysm, without rupture, unspecified: Secondary | ICD-10-CM

## 2020-03-17 DIAGNOSIS — N183 Chronic kidney disease, stage 3 unspecified: Secondary | ICD-10-CM | POA: Diagnosis not present

## 2020-03-17 DIAGNOSIS — Z125 Encounter for screening for malignant neoplasm of prostate: Secondary | ICD-10-CM | POA: Diagnosis not present

## 2020-03-17 MED ORDER — GADOBUTROL 1 MMOL/ML IV SOLN
9.0000 mL | Freq: Once | INTRAVENOUS | Status: AC | PRN
  Administered 2020-03-17: 9 mL via INTRAVENOUS

## 2020-03-18 ENCOUNTER — Telehealth: Payer: Self-pay

## 2020-03-18 NOTE — Telephone Encounter (Signed)
Call to patient to review MRI Aorta results.    Pt verbalized understanding and has no further questions at this time.    Advised pt to call for any further questions or concerns.  No further orders.  Confirmed appt oct 2021.

## 2020-03-18 NOTE — Telephone Encounter (Signed)
-----   Message from Nelva Bush, MD sent at 03/18/2020  1:02 PM EDT ----- Please let Timothy Bernard know that his ascending aorta remains moderately enlarged but is unchanged compared with last year.  We will continue annual imaging of the aorta to ensure that it does not grow.  He should continue his current medications and f/u in the office as planned in October.

## 2020-03-20 ENCOUNTER — Ambulatory Visit (HOSPITAL_COMMUNITY): Payer: BC Managed Care – PPO

## 2020-03-20 DIAGNOSIS — N5201 Erectile dysfunction due to arterial insufficiency: Secondary | ICD-10-CM | POA: Diagnosis not present

## 2020-04-29 ENCOUNTER — Other Ambulatory Visit: Payer: Self-pay

## 2020-04-29 ENCOUNTER — Ambulatory Visit (INDEPENDENT_AMBULATORY_CARE_PROVIDER_SITE_OTHER): Payer: BC Managed Care – PPO | Admitting: Endocrinology

## 2020-04-29 ENCOUNTER — Encounter: Payer: Self-pay | Admitting: Endocrinology

## 2020-04-29 VITALS — BP 130/84 | HR 75 | Ht 71.0 in | Wt 217.2 lb

## 2020-04-29 DIAGNOSIS — E119 Type 2 diabetes mellitus without complications: Secondary | ICD-10-CM

## 2020-04-29 LAB — POCT GLYCOSYLATED HEMOGLOBIN (HGB A1C): Hemoglobin A1C: 7 % — AB (ref 4.0–5.6)

## 2020-04-29 MED ORDER — DAPAGLIFLOZIN PROPANEDIOL 5 MG PO TABS: 5.0000 mg | ORAL_TABLET | Freq: Every day | ORAL | 3 refills | Status: DC

## 2020-04-29 MED ORDER — CANAGLIFLOZIN 100 MG PO TABS: 100.0000 mg | ORAL_TABLET | Freq: Every day | ORAL | 3 refills | Status: DC

## 2020-04-29 NOTE — Patient Instructions (Addendum)
check your blood sugar once a day.  vary the time of day when you check, between before the 3 meals, and at bedtime.  also check if you have symptoms of your blood sugar being too high or too low.  please keep a record of the readings and bring it to your next appointment here (or you can bring the meter itself).  You can write it on any piece of paper.  please call us sooner if your blood sugar goes below 70, or if you have a lot of readings over 200.   Please continue the same diabetes medications.  Please come back for a follow-up appointment in 3 months.
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
## 2020-05-10 ENCOUNTER — Other Ambulatory Visit: Payer: Self-pay

## 2020-05-10 ENCOUNTER — Ambulatory Visit (INDEPENDENT_AMBULATORY_CARE_PROVIDER_SITE_OTHER): Payer: BC Managed Care – PPO

## 2020-05-10 DIAGNOSIS — Z23 Encounter for immunization: Secondary | ICD-10-CM

## 2020-05-20 ENCOUNTER — Other Ambulatory Visit: Payer: Self-pay | Admitting: Endocrinology

## 2020-05-31 ENCOUNTER — Other Ambulatory Visit: Payer: Self-pay | Admitting: Internal Medicine

## 2020-06-04 ENCOUNTER — Other Ambulatory Visit: Payer: Self-pay

## 2020-06-04 ENCOUNTER — Encounter: Payer: Self-pay | Admitting: Internal Medicine

## 2020-06-04 ENCOUNTER — Ambulatory Visit: Payer: BC Managed Care – PPO | Admitting: Internal Medicine

## 2020-06-04 VITALS — BP 120/72 | HR 76 | Ht 72.0 in | Wt 220.0 lb

## 2020-06-04 DIAGNOSIS — I712 Thoracic aortic aneurysm, without rupture, unspecified: Secondary | ICD-10-CM

## 2020-06-04 DIAGNOSIS — I10 Essential (primary) hypertension: Secondary | ICD-10-CM

## 2020-06-04 DIAGNOSIS — I491 Atrial premature depolarization: Secondary | ICD-10-CM | POA: Diagnosis not present

## 2020-06-04 DIAGNOSIS — Q231 Congenital insufficiency of aortic valve: Secondary | ICD-10-CM | POA: Diagnosis not present

## 2020-06-04 DIAGNOSIS — I493 Ventricular premature depolarization: Secondary | ICD-10-CM

## 2020-06-04 DIAGNOSIS — Q2381 Bicuspid aortic valve: Secondary | ICD-10-CM
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
## 2020-06-04 NOTE — Patient Instructions (Signed)
Medication Instructions:  Your physician recommends that you continue on your current medications as directed. Please refer to the Current Medication list given to you today.  *If you need a refill on your cardiac medications before your next appointment, please call your pharmacy*   Lab Work: Your physician recommends that you return for lab work in: TODAY - BMP, Mg, TSH.  If you have labs (blood work) drawn today and your tests are completely normal, you will receive your results only by: [name] MyChart Message (if you have MyChart) OR . A paper copy in the mail If you have any lab test that is abnormal or we need to change your treatment, we will call you to review the results.  Testing/Procedures:  During Summer 2022 Your physician has requested that you have an echocardiogram. Echocardiography is a painless test that uses sound waves to create images of your heart. It provides your doctor with information about the size and shape of your heart and how well your heart's chambers and valves are working. This procedure takes approximately one hour. There are no restrictions for this procedure. You may get an IV, if needed, to receive an ultrasound enhancing agent through to better visualize your heart.    MRI Chest in during Summer 2022. Please call to schedule if you have not heard anything from our office.   Follow-Up: At [facility], you and your health needs are our priority.  As part of our continuing mission to provide you with exceptional heart care, we have created designated Provider Care Teams.  These Care Teams include your primary Cardiologist (physician) and Advanced Practice Providers (APPs -  Physician Assistants and Nurse Practitioners) who all work together to provide you with the care you need, when you need it.  We recommend signing up for the patient portal called "MyChart".  Sign up information is provided on this After Visit Summary.  MyChart is used to connect with  patients for Virtual Visits (Telemedicine).  Patients are able to view lab/test results, encounter notes, upcoming appointments, etc.  Non-urgent messages can be sent to your provider as well.   To learn more about what you can do with MyChart, go to [email or web address].    Your next appointment:   12 month(s)  The format for your next appointment:   In Person  Provider:   You may see Nelva Bush, MD or one of the following Advanced Practice Providers on your designated Care Team:    Murray Hodgkins, NP  Christell Faith, PA-C  Marrianne Mood, PA-C  Cadence [place], [place]

## 2020-06-05 LAB — BASIC METABOLIC PANEL
BUN/Creatinine Ratio: 15 (ref 10–24)
BUN: 16 mg/dL (ref 8–27)
CO2: 21 mmol/L (ref 20–29)
Calcium: 10.1 mg/dL (ref 8.6–10.2)
Chloride: 107 mmol/L — ABNORMAL HIGH (ref 96–106)
Creatinine, Ser: 1.09 mg/dL (ref 0.76–1.27)
GFR calc Af Amer: 82 mL/min/{1.73_m2} (ref >59)
GFR calc non Af Amer: 71 mL/min/{1.73_m2} (ref >59)
Glucose: 148 mg/dL — ABNORMAL HIGH (ref 65–99)
Potassium: 4.7 mmol/L (ref 3.5–5.2)
Sodium: 140 mmol/L (ref 134–144)

## 2020-06-05 LAB — TSH: TSH: 0.977 u[IU]/mL (ref 0.450–4.500)

## 2020-06-05 LAB — MAGNESIUM: Magnesium: 2 mg/dL (ref 1.6–2.3)

## 2020-06-09 ENCOUNTER — Telehealth: Payer: Self-pay | Admitting: *Deleted

## 2020-06-09 DIAGNOSIS — I491 Atrial premature depolarization: Secondary | ICD-10-CM

## 2020-06-09 DIAGNOSIS — I493 Ventricular premature depolarization: Secondary | ICD-10-CM

## 2020-06-09 NOTE — Telephone Encounter (Signed)
Patient at work. Spoke to wife, ok per DPR. She verbalized understanding of the results and agreeable to have the ZIO 3 day monitor mailed to their house. She will give the information to the patient as well. Patient registered on ZIO to have monitor mailed to his house.

## 2020-06-09 NOTE — Telephone Encounter (Signed)
-----   Message from Nelva Bush, MD sent at 06/05/2020  8:23 PM EDT ----- Kidney function and electrolytes are normal, as is TSH.  Given frequent PAC's and PVC's, I recommend that Timothy Bernard wear a monitor for 3 days to assess PAC/PVC burden.

## 2020-06-12 NOTE — Progress Notes (Signed)
This request has received a Favorable outcome from [employer].  Please keep in mind this is not a guarantee of payment. Eligibility and Benefit determinations will be made at the time of service.  Please note any additional information provided by [employer] at the bottom of the screen. 

## 2020-06-15 ENCOUNTER — Ambulatory Visit (INDEPENDENT_AMBULATORY_CARE_PROVIDER_SITE_OTHER): Payer: BC Managed Care – PPO

## 2020-06-15 DIAGNOSIS — I491 Atrial premature depolarization: Secondary | ICD-10-CM

## 2020-06-15 DIAGNOSIS — I493 Ventricular premature depolarization: Secondary | ICD-10-CM

## 2020-06-23 DIAGNOSIS — I491 Atrial premature depolarization: Secondary | ICD-10-CM | POA: Diagnosis not present

## 2020-06-28 ENCOUNTER — Ambulatory Visit: Payer: BC Managed Care – PPO | Attending: Internal Medicine

## 2020-06-28 DIAGNOSIS — Z23 Encounter for immunization: Secondary | ICD-10-CM

## 2020-06-28 NOTE — Progress Notes (Signed)
   Covid-19 Vaccination Clinic  Name:  Timothy Bernard    MRN: [id number] DOB: [birth year]  06/28/2020  [name] was observed post Covid-19 immunization for 15 minutes without incident. He was provided with Vaccine Information Sheet and instruction to access the V-Safe system.   [name] was instructed to call 911 with any severe reactions post vaccine: [name] Difficulty breathing  . Swelling of face and throat  . A fast heartbeat  . A bad rash all over body  . Dizziness and weakness

## 2020-07-30 ENCOUNTER — Other Ambulatory Visit: Payer: Self-pay

## 2020-07-30 ENCOUNTER — Ambulatory Visit: Payer: BC Managed Care – PPO | Admitting: Endocrinology

## 2020-07-30 ENCOUNTER — Encounter: Payer: Self-pay | Admitting: Endocrinology

## 2020-07-30 VITALS — BP 134/84 | HR 79 | Ht 74.0 in | Wt 214.8 lb

## 2020-07-30 DIAGNOSIS — E119 Type 2 diabetes mellitus without complications: Secondary | ICD-10-CM

## 2020-07-30 LAB — POCT GLYCOSYLATED HEMOGLOBIN (HGB A1C): Hemoglobin A1C: 7.6 % — AB (ref 4.0–5.6)

## 2020-07-30 MED ORDER — DAPAGLIFLOZIN PROPANEDIOL 5 MG PO TABS: 5.0000 mg | ORAL_TABLET | Freq: Every day | ORAL | 3 refills | Status: DC

## 2020-07-30 MED ORDER — REPAGLINIDE 0.5 MG PO TABS: 0.5000 mg | ORAL_TABLET | Freq: Two times a day (BID) | ORAL | 3 refills | Status: DC

## 2020-07-30 NOTE — Patient Instructions (Addendum)
check your blood sugar once a day.  vary the time of day when you check, between before the 3 meals, and at bedtime.  also check if you have symptoms of your blood sugar being too high or too low.  please keep a record of the readings and bring it to your next appointment here (or you can bring the meter itself).  You can write it on any piece of paper.  please call us sooner if your blood sugar goes below 70, or if you have a lot of readings over 200.   I have sent a prescription to your pharmacy, to increase the repaglinide. Please continue the same other diabetes medications.   Please come back for a follow-up appointment in 3 months.

## 2020-07-30 NOTE — Progress Notes (Signed)
Subjective:    Patient ID: Timothy Bernard, male    DOB: 1956-03-25, 64 y.o.   MRN: [id number]  HPI Pt returns for f/u of diabetes mellitus: DM type: 2 Dx'ed: 6073 Complications: TAA and renal insuff.   Therapy: Ozempic and 3 oral meds.   DKA: never Severe hypoglycemia: never Pancreatitis: never Pancreatic imaging: never Other: He did not tolerate Trulicity (n/d); this also limits Ozemic dosage.  he declines insulin, at least for now; Invokana dosage is limited by polyuria.   Interval history: He brings a record of his cbg's which I have reviewed today.  CBG's vary from 117-225.  pt states he feels well in general.   Past Medical History:  Diagnosis Date  . Adenomatous colon polyp 09/29/2007   52mm cecal adenomatous polyp 10 mm adenoama, post-polypectomy burn syndrome  . Ascending aortic aneurysm (HCC)    4.8 cm on ech (5/10), 4.6 x 4.6 cm by MRA 6/10 4.6 cm aortic root and ascending aorta by MRA 9/10. MRA chest (3/11) 4.5 ascending aorta. Stable by MRA 6/18  . Bicuspid aortic valve    echo (5/10) with bicuspid AoV (upon re-review), mild LV dilation, EF 50-55%, no aortic stenosis, mild AI, moderate ascending aortic dilation (4.8 cm). 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 Echo (3/11) bicuspid aortic valve with no AS but mild AI, mild LVH, EF 60%.    . Blood transfusion without reported diagnosis   . Cataract   . HTN (hypertension)   . Hyperlipidemia   . 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 OSA on CPAP     Past Surgical History:  Procedure Laterality Date  . COLONOSCOPY W/ BIOPSIES AND POLYPECTOMY  09/29/2007  . Mole removed      Social History   Socioeconomic History  . Marital status: Married    Spouse name: Not on file  . Number of children: Not on file  . Years of education: Not on file  . Highest education level: Not on file  Occupational History  . Occupation: Therapist, sports: [employer]  Tobacco Use  . Smoking status: Never  Smoker  . Smokeless tobacco: Never Used  Vaping Use  . Vaping Use: Never used  Substance and Sexual Activity  . Alcohol use: No    Alcohol/week: 0.0 standard drinks  . Drug use: No  . Sexual activity: Not on file  Other Topics Concern  . Not on file  Social History Narrative   [place]    Married, 1 stepson       Pt signed designated party release form and gives Dyon Rotert, spouse [phone], access to medical records. Can leave msg on answering machine.    Social Determinants of Health   Financial Resource Strain:   . Difficulty of Paying Living Expenses: Not on file  Food Insecurity:   . Worried About Charity fundraiser in the Last Year: Not on file  . Ran Out of Food in the Last Year: Not on file  Transportation Needs:   . Lack of Transportation (Medical): Not on file  . Lack of Transportation (Non-Medical): Not on file  Physical Activity:   . Days of Exercise per Week: Not on file  . Minutes of Exercise per Session: Not on file  Stress:   . Feeling of Stress : Not on file  Social Connections:   . Frequency of Communication with Friends and Family: Not on file  . Frequency of Social Gatherings with Friends and Family:  Not on file  . Attends Religious Services: Not on file  . Active Member of Clubs or Organizations: Not on file  . Attends Archivist Meetings: Not on file  . Marital Status: Not on file  Intimate Partner Violence:   . Fear of Current or Ex-Partner: Not on file  . Emotionally Abused: Not on file  . Physically Abused: Not on file  . Sexually Abused: Not on file    Current Outpatient Medications on File Prior to Visit  Medication Sig Dispense Refill  . aspirin 81 MG tablet Take 81 mg by mouth daily.      . Flaxseed, Linseed, (FLAX SEED OIL) 1000 MG CAPS Take 1 capsule by mouth daily.     . Lancets (ONETOUCH ULTRASOFT) lancets 1 each by Other route daily. Dx:250.00 100 each 11  . metFORMIN (GLUCOPHAGE-XR) 500 MG 24 hr tablet TAKE  4 TABLETS BY MOUTH DAILY WITH BREAKFAST 360 tablet 0  . metoprolol succinate (TOPROL-XL) 50 MG 24 hr tablet TAKE 1 TABLET BY MOUTH  DAILY WITH OR IMMEDIATLEY  FOLLOWING A MEAL 90 tablet 3  . ONETOUCH ULTRA test strip USE TO TEST BLOOD SUGAR ONCE DAILY E11.65 50 strip 7  . OZEMPIC, 0.25 OR 0.5 MG/DOSE, 2 MG/1.5ML SOPN INJECT 0.25 MG INTO THE SKIN ONCE A WEEK. 1 pen 11  . rosuvastatin (CRESTOR) 5 MG tablet Take 1 tablet (5 mg total) by mouth daily. 90 tablet 3  . lisinopril (ZESTRIL) 10 MG tablet Take 1 tablet (10 mg total) by mouth 2 (two) times daily. 180 tablet 3  . tadalafil (CIALIS) 20 MG tablet TAKE 1/2 OR 1 TABLET BY MOUTH EVERY 3 DAYS AS NEEDED FOR ERECTILE DYSFUNCTION (Patient not taking: Reported on 07/30/2020) 4 tablet 11   No current facility-administered medications on file prior to visit.    No Known Allergies  Family History  Problem Relation Age of Onset  . Hypertension Mother   . Diabetes Mother   . Cancer Father   . Coronary artery disease Maternal Grandfather   . Hypertension Other        maternal side  . Colon cancer Neg Hx   . Prostate cancer Neg Hx   . Esophageal cancer Neg Hx   . Liver cancer Neg Hx   . Pancreatic cancer Neg Hx   . Stomach cancer Neg Hx   . Rectal cancer Neg Hx     BP 134/84   Pulse 79   Ht 6\' 2"  (1.88 m)   Wt 214 lb 12.8 oz (97.4 kg)   SpO2 96%   BMI 27.58 kg/m    Review of Systems     Objective:   Physical Exam VITAL SIGNS:  See vs page GENERAL: no distress Pulses: dorsalis pedis intact bilat.   MSK: no deformity of the feet CV: no leg edema, but there are bilat vv's Skin:  no ulcer on the feet.  normal color and temp on the feet. Neuro: sensation is intact to touch on the feet   Lab Results  Component Value Date   HGBA1C 7.6 (A) 07/30/2020       Assessment & Plan:  Type 2 DM, with CRI: uncontrolled.   Patient Instructions  check your blood sugar once a day.  vary the time of day when you check, between before the 3  meals, and at bedtime.  also check if you have symptoms of your blood sugar being too high or too low.  please keep a record of the  readings and bring it to your next appointment here (or you can bring the meter itself).  You can write it on any piece of paper.  please call us sooner if your blood sugar goes below 70, or if you have a lot of readings over 200.   I have sent a prescription to your pharmacy, to increase the repaglinide. Please continue the same other diabetes medications.   Please come back for a follow-up appointment in 3 months.

## 2020-08-15 ENCOUNTER — Other Ambulatory Visit: Payer: Self-pay

## 2020-08-15 ENCOUNTER — Ambulatory Visit (INDEPENDENT_AMBULATORY_CARE_PROVIDER_SITE_OTHER): Payer: BC Managed Care – PPO

## 2020-08-15 DIAGNOSIS — Q231 Congenital insufficiency of aortic valve: Secondary | ICD-10-CM | POA: Diagnosis not present

## 2020-08-15 DIAGNOSIS — I712 Thoracic aortic aneurysm, without rupture, unspecified: Secondary | ICD-10-CM

## 2020-08-15 LAB — ECHOCARDIOGRAM COMPLETE
AR max vel: 1.9 cm2
AV Area VTI: 2.38 cm2
AV Area mean vel: 1.96 cm2
AV Mean grad: 8.5 mmHg
AV Peak grad: 14.5 mmHg
Ao pk vel: 1.91 m/s
Area-P 1/2: 5.13 cm2
Calc EF: 56.5 %
S' Lateral: 4.2 cm
Single Plane A2C EF: 56.9 %
Single Plane A4C EF: 55.8 %

## 2020-08-16 ENCOUNTER — Other Ambulatory Visit: Payer: Self-pay | Admitting: Endocrinology

## 2020-08-28 ENCOUNTER — Ambulatory Visit (INDEPENDENT_AMBULATORY_CARE_PROVIDER_SITE_OTHER): Payer: BC Managed Care – PPO | Admitting: Cardiovascular Disease

## 2020-08-28 ENCOUNTER — Other Ambulatory Visit: Payer: Self-pay

## 2020-08-28 ENCOUNTER — Encounter: Payer: Self-pay | Admitting: Internal Medicine

## 2020-08-28 VITALS — BP 128/62 | HR 80 | Ht 72.0 in | Wt 224.0 lb

## 2020-08-28 DIAGNOSIS — I712 Thoracic aortic aneurysm, without rupture, unspecified: Secondary | ICD-10-CM

## 2020-08-28 DIAGNOSIS — I10 Essential (primary) hypertension: Secondary | ICD-10-CM

## 2020-08-28 DIAGNOSIS — Q231 Congenital insufficiency of aortic valve: Secondary | ICD-10-CM | POA: Diagnosis not present

## 2020-08-28 DIAGNOSIS — E119 Type 2 diabetes mellitus without complications: Secondary | ICD-10-CM

## 2020-08-28 DIAGNOSIS — I493 Ventricular premature depolarization: Secondary | ICD-10-CM

## 2020-08-28 NOTE — Patient Instructions (Signed)
Medication Instructions:  No changes  If you need a refill on your cardiac medications before your next appointment, please call your pharmacy.    Lab work: No new labs needed   If you have labs (blood work) drawn today and your tests are completely normal, you will receive your results only by: [name] MyChart Message (if you have MyChart) OR . A paper copy in the mail If you have any lab test that is abnormal or we need to change your treatment, we will call you to review the results.   Testing/Procedures: No new testing needed   Follow-Up: At [facility], you and your health needs are our priority.  As part of our continuing mission to provide you with exceptional heart care, we have created designated Provider Care Teams.  These Care Teams include your primary Cardiologist (physician) and Advanced Practice Providers (APPs -  Physician Assistants and Nurse Practitioners) who all work together to provide you with the care you need, when you need it.  . You will need a follow up appointment in 12 months with Dr. Okey Dupre  . Providers on your designated Care Team:   . Nicolasa Ducking, NP . Eula Listen, PA-C . Marisue Ivan, PA-C  Any Other Special Instructions Will Be Listed Below (If Applicable).  COVID-19 Vaccine Information can be found at: [email or web address] For questions related to vaccine distribution or appointments, please email [email or web address] or call [phone].

## 2020-08-28 NOTE — Progress Notes (Signed)
Cardiology Office Note  Date:  08/28/2020   ID:  Bernard, Timothy 03-28-1956, MRN [id number]  PCP:  Venia Carbon, MD   Chief Complaint  Patient presents with  . Other    Follow up post Zio and ECHO. Meds reviewed verbally with patient.     HPI:  [name] is a 64 y.o. male with history of  bicuspid aortic valve,  thoracic aortic aneurysm,  hypertension,  hyperlipidemia,  type 2 diabetes mellitus,  obstructive sleep apnea,  PACs, PVCs who presents for follow-up of aortic stenosis and thoracic aortic aneurysm.  Last seen in clinic June 04, 2020 He presents today for discussion of recent echocardiogram and ZIO monitor Otherwise reports he is doing well, denies significant shortness of breath on exertion, no chest pain Denies having significant symptoms from palpitations Reports palpitations date back 15 years  EKG personally reviewed by myself on todays visit Normal sinus rhythm rate 87 bpm poor R wave progression to the anterior precordial leads, left anterior fascicular block, PACs, PVCs  Echocardiogram results reviewed with him 1. Left ventricular ejection fraction, by estimation, is 55 to 60%. The  left ventricle has normal function. The left ventricle has no regional  wall motion abnormalities. The left ventricular internal cavity size was  mildly dilated. Left ventricular  diastolic parameters are indeterminate.  2. Right ventricular systolic function is normal. The right ventricular  size is normal.  3. Left atrial size was mildly dilated.  4. The aortic valve was not well visualized. Unable to determine number  of leaflets. There is moderate calcification of the aortic valve. Aortic  valve regurgitation is not visualized. Mild to moderate aortic valve  sclerosis/calcification is present,  without any evidence of aortic stenosis.  5. Aortic dilatation noted. There is moderate dilatation of the aortic  root and of the ascending aorta, measuring 47  mm.   ZIO monitor  The patient was monitored for 2 days, 20 hours.  The predominant rhythm was sinus with an average rate of 76 bpm (range 58-120 bpm in sinus).  There were frequent PAC's (15% burden) and PVC's (8.4% burden).  16 atrial runs occurred, lasting up to 12 minutes, 5 seconds, with a maximum rate of 158 bpm.  First degree AV block was noted.  There were no patient triggered symptoms.   Predominantly sinus rhythm with frequent PAC's and PVC's, as well as PSVT lasting up to 12 minutes.   PMH:   has a past medical history of Adenomatous colon polyp (09/29/2007), Ascending aortic aneurysm ([place]), Bicuspid aortic valve, Blood transfusion without reported diagnosis, Cataract, HTN (hypertension), Hyperlipidemia, NIDDM (non-insulin dependent diabetes mellitus) (12/12), and OSA on CPAP.  PSH:    Past Surgical History:  Procedure Laterality Date  . COLONOSCOPY W/ BIOPSIES AND POLYPECTOMY  09/29/2007  . Mole removed      Current Outpatient Medications  Medication Sig Dispense Refill  . aspirin 81 MG tablet Take 81 mg by mouth daily.    . dapagliflozin propanediol (FARXIGA) 5 MG TABS tablet Take 1 tablet (5 mg total) by mouth daily before breakfast. 90 tablet 3  . Flaxseed, Linseed, (FLAX SEED OIL) 1000 MG CAPS Take 1 capsule by mouth daily.    . Lancets (ONETOUCH ULTRASOFT) lancets 1 each by Other route daily. Dx:250.00 100 each 11  . lisinopril (ZESTRIL) 10 MG tablet Take 1 tablet (10 mg total) by mouth 2 (two) times daily. 180 tablet 3  . metFORMIN (GLUCOPHAGE-XR) 500 MG 24 hr tablet TAKE 4  TABLETS BY MOUTH DAILY WITH BREAKFAST 360 tablet 0  . metoprolol succinate (TOPROL-XL) 50 MG 24 hr tablet TAKE 1 TABLET BY MOUTH  DAILY WITH OR IMMEDIATLEY  FOLLOWING A MEAL 90 tablet 3  . ONETOUCH ULTRA test strip USE TO TEST BLOOD SUGAR ONCE DAILY E11.65 50 strip 7  . OZEMPIC, 0.25 OR 0.5 MG/DOSE, 2 MG/1.5ML SOPN INJECT 0.25 MG INTO THE SKIN ONCE A WEEK. 1 pen 11  . repaglinide (PRANDIN)  0.5 MG tablet Take 1 tablet (0.5 mg total) by mouth 2 (two) times daily before a meal. Breakfast and supper 180 tablet 3  . rosuvastatin (CRESTOR) 5 MG tablet Take 1 tablet (5 mg total) by mouth daily. 90 tablet 3   No current facility-administered medications for this visit.     Allergies:   Patient has no known allergies.   Social History:  The patient  reports that he has never smoked. He has never used smokeless tobacco. He reports that he does not drink alcohol and does not use drugs.   Family History:   family history includes Cancer in his father; Coronary artery disease in his maternal grandfather; Diabetes in his mother; Hypertension in his mother and another family member.    Review of Systems: Review of Systems  Constitutional: Negative.   HENT: Negative.   Respiratory: Negative.   Cardiovascular: Positive for palpitations.  Gastrointestinal: Negative.   Musculoskeletal: Negative.   Neurological: Negative.   Psychiatric/Behavioral: Negative.   All other systems reviewed and are negative.   PHYSICAL EXAM: VS:  BP 128/62 (BP Location: Left Arm, Patient Position: Sitting, Cuff Size: Normal)   Pulse 80   Ht 6' (1.829 m)   Wt 224 lb (101.6 kg)   SpO2 96%   BMI 30.38 kg/m  , BMI Body mass index is 30.38 kg/m. GEN: Well nourished, well developed, in no acute distress HEENT: normal Neck: no JVD, carotid bruits, or masses Cardiac: RRR; no murmurs, rubs, or gallops,no edema  Respiratory:  clear to auscultation bilaterally, normal work of breathing GI: soft, nontender, nondistended, + BS MS: no deformity or atrophy Skin: warm and dry, no rash Neuro:  Strength and sensation are intact Psych: euthymic mood, full affect   Recent Labs: 03/14/2020: ALT 22; Hemoglobin 15.0; Platelets 256.0 06/04/2020: BUN 16; Creatinine, Ser 1.09; Magnesium 2.0; Potassium 4.7; Sodium 140; TSH 0.977    Lipid Panel Lab Results  Component Value Date   CHOL 105 03/14/2020   HDL 34.80 (L)  03/14/2020   LDLCALC 42 03/14/2020   TRIG 140.0 03/14/2020      Wt Readings from Last 3 Encounters:  08/28/20 224 lb (101.6 kg)  07/30/20 214 lb 12.8 oz (97.4 kg)  06/04/20 220 lb (99.8 kg)       ASSESSMENT AND PLAN:  Problem List Items Addressed This Visit      Cardiology Problems   Essential hypertension   Relevant Orders   EKG 12-Lead   Thoracic aortic aneurysm without rupture (HCC) - Primary   Bicuspid aortic valve     Other   Type 2 diabetes mellitus without complication, without long-term current use of insulin (HCC)    Other Visit Diagnoses    PVC (premature ventricular contraction)          Bicuspid aortic valve with dilated ascending aorta Recent echocardiogram reviewed with him, relatively stable numbers Stable mean gradient, no significant regurgitation Moderately dilated ascending aorta On average has been 4.7, most recent echo 4.8 cm Alternates MR imaging with echo every  other year  PVCs, PACs Reports he is asymptomatic Symptoms improved years ago when he was started on metoprolol succinate 50 daily He does not want to increase metoprolol succinate at this time as he reports doing well Normal ejection fraction We did discuss higher dose metoprolol or even antiarrhythmics if he becomes symptomatic  Type 2 diabetes Lifestyle modification recommended, recent bump in his A1c up to 7.6 from 7.0 Recommend low carbohydrate diet  Hyperlipidemia Numbers well controlled on Lipitor 40 daily, no changes made  Essential hypertension Blood pressure is well controlled on today's visit. No changes made to the medications.    will follow up with Dr. Saunders Revel in 1 year  Total encounter time more than 25 minutes  Greater than 50% was spent in counseling and coordination of care with the patient    Signed, Esmond Plants, M.D., Ph.D. [place], [place]

## 2020-09-12 ENCOUNTER — Telehealth: Payer: Self-pay

## 2020-09-12 DIAGNOSIS — U071 COVID-19: Secondary | ICD-10-CM

## 2020-09-12 NOTE — Telephone Encounter (Signed)
Lm for a return call, no other info given

## 2020-09-12 NOTE — Telephone Encounter (Signed)
Called wife. Said he tested positive for Covid yesterday. Symptoms started 09-08-20. No fever. Sore throat. Cough stuff up. Just wanted to know if there was anything special they needed to do with his health issues. Full vaccinated and boosted.

## 2020-09-12 NOTE — Telephone Encounter (Signed)
Just typical rest, fluids, analgesics Let him know I did refer him to our treatment team to see if he qualifies (I doubt for infusion but perhaps for one of the 2 pills that have become available in limited amounts)

## 2020-09-12 NOTE — Telephone Encounter (Signed)
Spoke to  pt's wife .

## 2020-09-16 ENCOUNTER — Other Ambulatory Visit: Payer: Self-pay | Admitting: Endocrinology

## 2020-11-05 ENCOUNTER — Other Ambulatory Visit: Payer: Self-pay

## 2020-11-05 ENCOUNTER — Ambulatory Visit: Payer: BC Managed Care – PPO | Admitting: Endocrinology

## 2020-11-05 VITALS — BP 146/80 | HR 69 | Ht 72.0 in | Wt 214.6 lb

## 2020-11-05 DIAGNOSIS — E119 Type 2 diabetes mellitus without complications: Secondary | ICD-10-CM

## 2020-11-05 LAB — POCT GLYCOSYLATED HEMOGLOBIN (HGB A1C): Hemoglobin A1C: 6.5 % — AB (ref 4.0–5.6)

## 2020-11-05 NOTE — Progress Notes (Signed)
Subjective:    Patient ID: Timothy Bernard, male    DOB: 11-01-1955, 65 y.o.   MRN: [id number]  HPI Pt returns for f/u of diabetes mellitus: DM type: 2 Dx'ed: 4656 Complications: TAA and renal insuff.   Therapy: Ozempic and 3 oral meds.   DKA: never Severe hypoglycemia: never Pancreatitis: never Pancreatic imaging: never Other: He did not tolerate Trulicity (n/d); this also limits Ozemic dosage.  he declines insulin, at least for now; Invokana dosage is limited by polyuria.   Interval history: He brings a record of his cbg's which I have reviewed today.  CBG's vary from 98-166.  pt states he feels well in general.  He takes meds as rx'ed Past Medical History:  Diagnosis Date  . Adenomatous colon polyp 09/29/2007   60mm cecal adenomatous polyp 10 mm adenoama, post-polypectomy burn syndrome  . Ascending aortic aneurysm (HCC)    4.8 cm on ech (5/10), 4.6 x 4.6 cm by MRA 6/10 4.6 cm aortic root and ascending aorta by MRA 9/10. MRA chest (3/11) 4.5 ascending aorta. Stable by MRA 6/18  . Bicuspid aortic valve    echo (5/10) with bicuspid AoV (upon re-review), mild LV dilation, EF 50-55%, no aortic stenosis, mild AI, moderate ascending aortic dilation (4.8 cm). 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 Echo (3/11) bicuspid aortic valve with no AS but mild AI, mild LVH, EF 60%.    . Blood transfusion without reported diagnosis   . Cataract   . HTN (hypertension)   . Hyperlipidemia   . 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 OSA on CPAP     Past Surgical History:  Procedure Laterality Date  . COLONOSCOPY W/ BIOPSIES AND POLYPECTOMY  09/29/2007  . Mole removed      Social History   Socioeconomic History  . Marital status: Married    Spouse name: Not on file  . Number of children: Not on file  . Years of education: Not on file  . Highest education level: Not on file  Occupational History  . Occupation: Therapist, sports: [employer]  Tobacco Use  .  Smoking status: Never Smoker  . Smokeless tobacco: Never Used  Vaping Use  . Vaping Use: Never used  Substance and Sexual Activity  . Alcohol use: No    Alcohol/week: 0.0 standard drinks  . Drug use: No  . Sexual activity: Not on file  Other Topics Concern  . Not on file  Social History Narrative   [place].    Married, 1 stepson       Pt signed designated party release form and gives Travaris Kosh, spouse [phone], access to medical records. Can leave msg on answering machine.    Social Determinants of Health   Financial Resource Strain: Not on file  Food Insecurity: Not on file  Transportation Needs: Not on file  Physical Activity: Not on file  Stress: Not on file  Social Connections: Not on file  Intimate Partner Violence: Not on file    Current Outpatient Medications on File Prior to Visit  Medication Sig Dispense Refill  . aspirin 81 MG tablet Take 81 mg by mouth daily.    . dapagliflozin propanediol (FARXIGA) 5 MG TABS tablet Take 1 tablet (5 mg total) by mouth daily before breakfast. 90 tablet 3  . Flaxseed, Linseed, (FLAX SEED OIL) 1000 MG CAPS Take 1 capsule by mouth daily.    . Lancets (ONETOUCH ULTRASOFT) lancets 1 each by Other route daily. Dx:250.00  100 each 11  . metFORMIN (GLUCOPHAGE-XR) 500 MG 24 hr tablet TAKE 4 TABLETS BY MOUTH DAILY WITH BREAKFAST. 360 tablet 0  . metoprolol succinate (TOPROL-XL) 50 MG 24 hr tablet TAKE 1 TABLET BY MOUTH  DAILY WITH OR IMMEDIATLEY  FOLLOWING A MEAL 90 tablet 3  . ONETOUCH ULTRA test strip USE TO TEST BLOOD SUGAR ONCE DAILY E11.65 50 strip 7  . OZEMPIC, 0.25 OR 0.5 MG/DOSE, 2 MG/1.5ML SOPN INJECT 0.25 MG INTO THE SKIN ONCE A WEEK. 1 pen 11  . repaglinide (PRANDIN) 0.5 MG tablet Take 1 tablet (0.5 mg total) by mouth 2 (two) times daily before a meal. Breakfast and supper 180 tablet 3  . rosuvastatin (CRESTOR) 5 MG tablet Take 1 tablet (5 mg total) by mouth daily. 90 tablet 3  . lisinopril (ZESTRIL) 10 MG tablet Take  1 tablet (10 mg total) by mouth 2 (two) times daily. 180 tablet 3   No current facility-administered medications on file prior to visit.    No Known Allergies  Family History  Problem Relation Age of Onset  . Hypertension Mother   . Diabetes Mother   . Cancer Father   . Coronary artery disease Maternal Grandfather   . Hypertension Other        maternal side  . Colon cancer Neg Hx   . Prostate cancer Neg Hx   . Esophageal cancer Neg Hx   . Liver cancer Neg Hx   . Pancreatic cancer Neg Hx   . Stomach cancer Neg Hx   . Rectal cancer Neg Hx     BP (!) 146/80 (BP Location: Right Arm, Patient Position: Sitting, Cuff Size: Normal)   Pulse 69   Ht 6' (1.829 m)   Wt 214 lb 9.6 oz (97.3 kg)   SpO2 97%   BMI 29.10 kg/m    Review of Systems He denies hypoglycemia/n/v.      Objective:   Physical Exam VITAL SIGNS:  See vs page GENERAL: no distress Pulses: dorsalis pedis intact bilat.   MSK: no deformity of the feet CV: no leg edema.   Skin:  no ulcer on the feet.  normal color and temp on the feet.  Neuro: sensation is intact to touch on the feet.     Lab Results  Component Value Date   HGBA1C 6.5 (A) 11/05/2020       Assessment & Plan:  Type 2 DM, with CRI: well-controlled.  HTN: is noted today.   Patient Instructions  Your blood pressure is high today.  Please see your primary care provider soon, to have it rechecked check your blood sugar once a day.  vary the time of day when you check, between before the 3 meals, and at bedtime.  also check if you have symptoms of your blood sugar being too high or too low.  please keep a record of the readings and bring it to your next appointment here (or you can bring the meter itself).  You can write it on any piece of paper.  please call us sooner if your blood sugar goes below 70, or if you have a lot of readings over 200.   Please continue the same 4 diabetes medications.   Please come back for a follow-up appointment in 4  months.

## 2020-11-05 NOTE — Patient Instructions (Addendum)
Your blood pressure is high today.  Please see your primary care provider soon, to have it rechecked check your blood sugar once a day.  vary the time of day when you check, between before the 3 meals, and at bedtime.  also check if you have symptoms of your blood sugar being too high or too low.  please keep a record of the readings and bring it to your next appointment here (or you can bring the meter itself).  You can write it on any piece of paper.  please call us sooner if your blood sugar goes below 70, or if you have a lot of readings over 200.   Please continue the same 4 diabetes medications.   Please come back for a follow-up appointment in 4 months.

## 2020-11-12 DIAGNOSIS — H35033 Hypertensive retinopathy, bilateral: Secondary | ICD-10-CM | POA: Diagnosis not present

## 2020-11-12 DIAGNOSIS — E119 Type 2 diabetes mellitus without complications: Secondary | ICD-10-CM | POA: Diagnosis not present

## 2020-11-12 DIAGNOSIS — D23111 Other benign neoplasm of skin of right upper eyelid, including canthus: Secondary | ICD-10-CM | POA: Diagnosis not present

## 2020-11-12 DIAGNOSIS — H0100A Unspecified blepharitis right eye, upper and lower eyelids: Secondary | ICD-10-CM | POA: Diagnosis not present

## 2020-11-12 LAB — HM DIABETES EYE EXAM

## 2021-01-10 ENCOUNTER — Other Ambulatory Visit: Payer: Self-pay | Admitting: Endocrinology

## 2021-02-01 ENCOUNTER — Other Ambulatory Visit: Payer: Self-pay | Admitting: Endocrinology

## 2021-02-18 ENCOUNTER — Other Ambulatory Visit: Payer: Self-pay | Admitting: Internal Medicine

## 2021-03-10 ENCOUNTER — Other Ambulatory Visit: Payer: BC Managed Care – PPO

## 2021-03-10 ENCOUNTER — Telehealth: Payer: Self-pay | Admitting: Internal Medicine

## 2021-03-10 NOTE — Telephone Encounter (Signed)
Patient calling to see if he is due for an echocardiogram. Last echo was done on 08/15/21. Patient has an MRI coming up which made him think about this.   Please advise. Patient is scheduled for yearly office visit on 10/6

## 2021-03-11 ENCOUNTER — Other Ambulatory Visit: Payer: Self-pay

## 2021-03-11 ENCOUNTER — Encounter: Payer: Self-pay | Admitting: Endocrinology

## 2021-03-11 ENCOUNTER — Ambulatory Visit: Payer: BC Managed Care – PPO | Admitting: Endocrinology

## 2021-03-11 VITALS — BP 118/78 | HR 69 | Ht 72.0 in | Wt 212.8 lb

## 2021-03-11 DIAGNOSIS — E119 Type 2 diabetes mellitus without complications: Secondary | ICD-10-CM | POA: Diagnosis not present

## 2021-03-11 LAB — POCT GLYCOSYLATED HEMOGLOBIN (HGB A1C): Hemoglobin A1C: 8 % — AB (ref 4.0–5.6)

## 2021-03-11 NOTE — Telephone Encounter (Signed)
Last office visit with Dr. Saunders Revel 06/04/20. Per note: We will also plan to repeat an echocardiogram next summer to reassess the bicuspid aortic valve, which showed very mild stenosis on last evaluation in 03/2019.  Pt since had Echo completed 08/15/21.  Spoke to pt's wife, ok per DPR. Notified I will clarify with Dr. Saunders Revel on whether a repeat Echo is recommended.  Pt has annual follow up scheduled 06/04/21.  Pt's wife is aware that Dr. Saunders Revel is out of the office and may be a few days before I am able to call pt or her back to advise.  Pt's wife appreciative and with no further concerns at this time.

## 2021-03-11 NOTE — Progress Notes (Signed)
Subjective:    Patient ID: Timothy Bernard, male    DOB: 1956/01/01, 65 y.o.   MRN: [id number]  HPI Pt returns for f/u of diabetes mellitus: DM type: 2 Dx'ed: 8889 Complications: TAA and renal insuff.   Therapy: Ozempic and 3 oral meds.   DKA: never Severe hypoglycemia: never Pancreatitis: never Pancreatic imaging: never Other: He did not tolerate Trulicity (n/d); this also limits Ozemic dosage.  he declines insulin, at least for now; Invokana dosage is limited by polyuria.   Interval history: He brings a record of his cbg's which I have reviewed today.  CBG's vary from 128-181.  pt states he feels well in general.  He takes meds as rx'ed.  He has not recently taken Ozempic.   Past Medical History:  Diagnosis Date   Adenomatous colon polyp 09/29/2007   57mm cecal adenomatous polyp 10 mm adenoama, post-polypectomy burn syndrome   Ascending aortic aneurysm (HCC)    4.8 cm on ech (5/10), 4.6 x 4.6 cm by MRA 6/10 4.6 cm aortic root and ascending aorta by MRA 9/10. MRA chest (3/11) 4.5 ascending aorta. Stable by MRA 6/18   Bicuspid aortic valve    echo (5/10) with bicuspid AoV (upon re-review), mild LV dilation, EF 50-55%, no aortic stenosis, mild AI, moderate ascending aortic dilation (4.8 cm). 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 Echo (3/11) bicuspid aortic valve with no AS but mild AI, mild LVH, EF 60%.     Blood transfusion without reported diagnosis    Cataract    HTN (hypertension)    Hyperlipidemia    NIDDM (non-insulin dependent diabetes mellitus) 12/12   OSA on CPAP     Past Surgical History:  Procedure Laterality Date   COLONOSCOPY W/ BIOPSIES AND POLYPECTOMY  09/29/2007   Mole removed      Social History   Socioeconomic History   Marital status: Married    Spouse name: Not on file   Number of children: Not on file   Years of education: Not on file   Highest education level: Not on file  Occupational History   Occupation: Therapist, sports: [employer]  Tobacco Use   Smoking status: Never   Smokeless tobacco: Never  Vaping Use   Vaping Use: Never used  Substance and Sexual Activity   Alcohol use: No    Alcohol/week: 0.0 standard drinks   Drug use: No   Sexual activity: Not on file  Other Topics Concern   Not on file  Social History Narrative   Land.    Married, 1 stepson       Pt signed designated party release form and gives Jaquez Farrington, spouse [phone], access to medical records. Can leave msg on answering machine.    Social Determinants of Health   Financial Resource Strain: Not on file  Food Insecurity: Not on file  Transportation Needs: Not on file  Physical Activity: Not on file  Stress: Not on file  Social Connections: Not on file  Intimate Partner Violence: Not on file    Current Outpatient Medications on File Prior to Visit  Medication Sig Dispense Refill   aspirin 81 MG tablet Take 81 mg by mouth daily.     dapagliflozin propanediol (FARXIGA) 5 MG TABS tablet Take 1 tablet (5 mg total) by mouth daily before breakfast. 90 tablet 3   Flaxseed, Linseed, (FLAX SEED OIL) 1000 MG CAPS Take 1 capsule by mouth daily.     Lancets (ONETOUCH ULTRASOFT) lancets  1 each by Other route daily. Dx:250.00 100 each 11   lisinopril (ZESTRIL) 10 MG tablet TAKE 1 TABLET TWICE A DAY 180 tablet 3   metFORMIN (GLUCOPHAGE-XR) 500 MG 24 hr tablet TAKE 4 TABLETS BY MOUTH DAILY WITH BREAKFAST. 360 tablet 0   metoprolol succinate (TOPROL-XL) 50 MG 24 hr tablet TAKE 1 TABLET DAILY WITH OR IMMEDIATELY FOLLOWING A MEAL 90 tablet 3   ONETOUCH ULTRA test strip USE TO TEST BLOOD SUGAR ONCE DAILY E11.65 50 strip 7   repaglinide (PRANDIN) 0.5 MG tablet Take 1 tablet (0.5 mg total) by mouth 2 (two) times daily before a meal. Breakfast and supper 180 tablet 3   rosuvastatin (CRESTOR) 5 MG tablet Take 1 tablet (5 mg total) by mouth daily. 90 tablet 3   No current facility-administered medications on file prior to visit.     No Known Allergies  Family History  Problem Relation Age of Onset   Hypertension Mother    Diabetes Mother    Cancer Father    Coronary artery disease Maternal Grandfather    Hypertension Other        maternal side   Colon cancer Neg Hx    Prostate cancer Neg Hx    Esophageal cancer Neg Hx    Liver cancer Neg Hx    Pancreatic cancer Neg Hx    Stomach cancer Neg Hx    Rectal cancer Neg Hx     BP 118/78 (BP Location: Right Arm, Patient Position: Sitting, Cuff Size: Normal)   Pulse 69   Ht 6' (1.829 m)   Wt 212 lb 12.8 oz (96.5 kg)   SpO2 95%   BMI 28.86 kg/m    Review of Systems Denies n/v    Objective:   Physical Exam Pulses: dorsalis pedis intact bilat.   MSK: no deformity of the feet CV: no leg edema, but there are bilat vv's Skin:  no ulcer on the feet.  normal color and temp on the feet. Neuro: sensation is intact to touch on the feet.   Lab Results  Component Value Date   HGBA1C 8.0 (A) 03/11/2021   Lab Results  Component Value Date   CREATININE 1.09 06/04/2020   BUN 16 06/04/2020   NA 140 06/04/2020   K 4.7 06/04/2020   CL 107 (H) 06/04/2020   CO2 21 06/04/2020       Assessment & Plan:  Type 2 DM: uncontrolled: I advised pt to resume Ozempic, but he declines.    Patient Instructions  check your blood sugar once a day.  vary the time of day when you check, between before the 3 meals, and at bedtime.  also check if you have symptoms of your blood sugar being too high or too low.  please keep a record of the readings and bring it to your next appointment here (or you can bring the meter itself).  You can write it on any piece of paper.  please call us sooner if your blood sugar goes below 70, or if you have a lot of readings over 200.   Please continue the same 3 diabetes medications.   Please come back for a follow-up appointment in 3 months.

## 2021-03-11 NOTE — Patient Instructions (Addendum)
check your blood sugar once a day.  vary the time of day when you check, between before the 3 meals, and at bedtime.  also check if you have symptoms of your blood sugar being too high or too low.  please keep a record of the readings and bring it to your next appointment here (or you can bring the meter itself).  You can write it on any piece of paper.  please call us sooner if your blood sugar goes below 70, or if you have a lot of readings over 200.   Please continue the same 3 diabetes medications.   Please come back for a follow-up appointment in 3 months.

## 2021-03-12 NOTE — Telephone Encounter (Signed)
No need to repeat an echocardiogram prior to the patient's follow-up visit in October.  He should move forward with MRA to assess his thoracic aortic aneurysm as scheduled.  Nelva Bush, MD [facility]

## 2021-03-13 DIAGNOSIS — Z125 Encounter for screening for malignant neoplasm of prostate: Secondary | ICD-10-CM | POA: Diagnosis not present

## 2021-03-13 NOTE — Telephone Encounter (Signed)
Was able to reach out to pt's wife Blanch Media (DPR approved), advised on echo roder, that Dr. Saunders Revel advised  "No need to repeat an echocardiogram prior to the patient's follow-up visit in October.  He should move forward with MRA to assess his thoracic aortic aneurysm as scheduled"  Blanch Media is very delighted to hear this, will keep appt on 7/21 for MR [place], defer the echo and f/u with Dr. Saunders Revel as scheudle.

## 2021-03-17 ENCOUNTER — Encounter: Payer: BC Managed Care – PPO | Admitting: Internal Medicine

## 2021-03-19 ENCOUNTER — Ambulatory Visit (HOSPITAL_COMMUNITY)
Admission: RE | Admit: 2021-03-19 | Discharge: 2021-03-19 | Disposition: A | Discharge status: Home / Self Care | Payer: BC Managed Care – PPO | Source: Ambulatory Visit | Attending: Internal Medicine | Admitting: Internal Medicine

## 2021-03-19 ENCOUNTER — Other Ambulatory Visit: Payer: Self-pay

## 2021-03-19 DIAGNOSIS — I712 Thoracic aortic aneurysm, without rupture, unspecified: Secondary | ICD-10-CM

## 2021-03-19 MED ORDER — GADOBUTROL 1 MMOL/ML IV SOLN
9.0000 mL | Freq: Once | INTRAVENOUS | Status: AC | PRN
  Administered 2021-03-19: 9 mL via INTRAVENOUS

## 2021-03-20 DIAGNOSIS — N5201 Erectile dysfunction due to arterial insufficiency: Secondary | ICD-10-CM | POA: Diagnosis not present

## 2021-03-20 DIAGNOSIS — Z125 Encounter for screening for malignant neoplasm of prostate: Secondary | ICD-10-CM | POA: Diagnosis not present

## 2021-03-23 ENCOUNTER — Telehealth: Payer: Self-pay | Admitting: *Deleted

## 2021-03-23 DIAGNOSIS — I712 Thoracic aortic aneurysm, without rupture, unspecified: Secondary | ICD-10-CM

## 2021-03-23 NOTE — Telephone Encounter (Signed)
Spoke to pt's wife, Blanch Media (DPR approved), notified of results and provider's recc.  Blanch Media voiced understanding. Orders placed for repeat MRA of chest to be done in 1 year.  Blanch Media has no further questions at this time.

## 2021-03-23 NOTE — Telephone Encounter (Signed)
-----   Message from Nelva Bush, MD sent at 03/22/2021  8:19 PM EDT ----- MRA of the chest shows relatively stable moderate dilation of the thoracic aorta measuring up to 4.7 cm, though there has been a little growth compared to 2010-2013.  We will plan to repeat an MRA in ~1 year for follow-up.

## 2021-05-04 ENCOUNTER — Other Ambulatory Visit: Payer: Self-pay | Admitting: Endocrinology

## 2021-05-19 ENCOUNTER — Other Ambulatory Visit: Payer: Self-pay | Admitting: Internal Medicine

## 2021-05-29 ENCOUNTER — Ambulatory Visit (INDEPENDENT_AMBULATORY_CARE_PROVIDER_SITE_OTHER): Payer: BC Managed Care – PPO | Admitting: Internal Medicine

## 2021-05-29 ENCOUNTER — Other Ambulatory Visit: Payer: Self-pay

## 2021-05-29 ENCOUNTER — Encounter: Payer: Self-pay | Admitting: Internal Medicine

## 2021-05-29 VITALS — BP 138/78 | HR 62 | Temp 97.3°F | Ht 71.0 in | Wt 218.0 lb

## 2021-05-29 DIAGNOSIS — Z0000 Encounter for general adult medical examination without abnormal findings: Secondary | ICD-10-CM | POA: Diagnosis not present

## 2021-05-29 DIAGNOSIS — E1159 Type 2 diabetes mellitus with other circulatory complications: Secondary | ICD-10-CM | POA: Diagnosis not present

## 2021-05-29 DIAGNOSIS — Z23 Encounter for immunization: Secondary | ICD-10-CM

## 2021-05-29 DIAGNOSIS — I10 Essential (primary) hypertension: Secondary | ICD-10-CM

## 2021-05-29 LAB — RENAL FUNCTION PANEL
Albumin: 3.9 g/dL (ref 3.5–5.2)
BUN: 14 mg/dL (ref 6–23)
CO2: 26 mEq/L (ref 19–32)
Calcium: 8.8 mg/dL (ref 8.4–10.5)
Chloride: 105 mEq/L (ref 96–112)
Creatinine, Ser: 1.12 mg/dL (ref 0.40–1.50)
GFR: 69.01 mL/min (ref >60.00)
Glucose, Bld: 193 mg/dL — ABNORMAL HIGH (ref 70–99)
Phosphorus: 3.1 mg/dL (ref 2.3–4.6)
Potassium: 4 mEq/L (ref 3.5–5.1)
Sodium: 139 mEq/L (ref 135–145)

## 2021-05-29 LAB — HEPATIC FUNCTION PANEL
ALT: 22 U/L (ref 0–53)
AST: 19 U/L (ref 0–37)
Albumin: 3.9 g/dL (ref 3.5–5.2)
Alkaline Phosphatase: 80 U/L (ref 39–117)
Bilirubin, Direct: 0.1 mg/dL (ref 0.0–0.3)
Total Bilirubin: 0.5 mg/dL (ref 0.2–1.2)
Total Protein: 6.8 g/dL (ref 6.0–8.3)

## 2021-05-29 LAB — LIPID PANEL
Cholesterol: 114 mg/dL (ref 0–200)
HDL: 32.6 mg/dL — ABNORMAL LOW (ref >39.00)
LDL Cholesterol: 48 mg/dL (ref 0–99)
NonHDL: 81.04
Total CHOL/HDL Ratio: 3
Triglycerides: 164 mg/dL — ABNORMAL HIGH (ref 0.0–149.0)
VLDL: 32.8 mg/dL (ref 0.0–40.0)

## 2021-05-29 LAB — CBC
HCT: 45.2 % (ref 39.0–52.0)
Hemoglobin: 14.6 g/dL (ref 13.0–17.0)
MCHC: 32.4 g/dL (ref 30.0–36.0)
MCV: 92.6 fl (ref 78.0–100.0)
Platelets: 204 10*3/uL (ref 150.0–400.0)
RBC: 4.88 Mil/uL (ref 4.22–5.81)
RDW: 13.8 % (ref 11.5–15.5)
WBC: 6.8 10*3/uL (ref 4.0–10.5)

## 2021-05-29 LAB — HM DIABETES FOOT EXAM

## 2021-05-29 NOTE — Assessment & Plan Note (Signed)
Healthy Working on lifestyle Pneumovax 23 and flu vaccines today Bivalent COVID soon Colon due 2024 Had recent PSA

## 2021-05-29 NOTE — Assessment & Plan Note (Signed)
Slipped in lifestyle due to mom's decline--working harder now Off ozempic--may need to go back on Farxiga, prandin and metformin for now On statin

## 2021-05-29 NOTE — Progress Notes (Signed)
Subjective:    Patient ID: Timothy Bernard, male    DOB: 1956/08/12, 65 y.o.   MRN: [id number]  HPI Here for physical This visit occurred during the SARS-CoV-2 public health emergency.  Safety protocols were in place, including screening questions prior to the visit, additional usage of staff PPE, and extensive cleaning of exam room while observing appropriate contact time as indicated for disinfecting solutions.   Doing okay with diabetes A1c was up some---stress with mom's decline and then death Off the ozempic now Back to working harder with proper eating Trying to walk more now Weight stable---wants to lose more weight  Retiring in 8 months  Current Outpatient Medications on File Prior to Visit  Medication Sig Dispense Refill   aspirin 81 MG tablet Take 81 mg by mouth daily.     dapagliflozin propanediol (FARXIGA) 5 MG TABS tablet Take 1 tablet (5 mg total) by mouth daily before breakfast. 90 tablet 3   Flaxseed, Linseed, (FLAX SEED OIL) 1000 MG CAPS Take 1 capsule by mouth daily.     Lancets (ONETOUCH ULTRASOFT) lancets 1 each by Other route daily. Dx:250.00 100 each 11   lisinopril (ZESTRIL) 10 MG tablet TAKE 1 TABLET TWICE A DAY 180 tablet 3   metFORMIN (GLUCOPHAGE-XR) 500 MG 24 hr tablet TAKE 4 TABLETS BY MOUTH DAILY WITH BREAKFAST. 360 tablet 0   metoprolol succinate (TOPROL-XL) 50 MG 24 hr tablet TAKE 1 TABLET DAILY WITH OR IMMEDIATELY FOLLOWING A MEAL 90 tablet 3   ONETOUCH ULTRA test strip USE TO TEST BLOOD SUGAR ONCE DAILY E11.65 50 strip 7   rosuvastatin (CRESTOR) 5 MG tablet TAKE 1 TABLET DAILY 365 tablet 0   No current facility-administered medications on file prior to visit.    No Known Allergies  Past Medical History:  Diagnosis Date   Adenomatous colon polyp 09/29/2007   86mm cecal adenomatous polyp 10 mm adenoama, post-polypectomy burn syndrome   Ascending aortic aneurysm (HCC)    4.8 cm on ech (5/10), 4.6 x 4.6 cm by MRA 6/10 4.6 cm aortic root and  ascending aorta by MRA 9/10. MRA chest (3/11) 4.5 ascending aorta. Stable by MRA 6/18   Bicuspid aortic valve    echo (5/10) with bicuspid AoV (upon re-review), mild LV dilation, EF 50-55%, no aortic stenosis, mild AI, moderate ascending aortic dilation (4.8 cm). 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 Echo (3/11) bicuspid aortic valve with no AS but mild AI, mild LVH, EF 60%.     Blood transfusion without reported diagnosis    Cataract    HTN (hypertension)    Hyperlipidemia    NIDDM (non-insulin dependent diabetes mellitus) 12/12   OSA on CPAP     Past Surgical History:  Procedure Laterality Date   COLONOSCOPY W/ BIOPSIES AND POLYPECTOMY  09/29/2007   Mole removed      Family History  Problem Relation Age of Onset   Hypertension Mother    Diabetes Mother    Cancer Father    Coronary artery disease Maternal Grandfather    Hypertension Other        maternal side   Colon cancer Neg Hx    Prostate cancer Neg Hx    Esophageal cancer Neg Hx    Liver cancer Neg Hx    Pancreatic cancer Neg Hx    Stomach cancer Neg Hx    Rectal cancer Neg Hx     Social History   Socioeconomic History   Marital status: Married    Spouse name:  Not on file   Number of children: Not on file   Years of education: Not on file   Highest education level: Not on file  Occupational History   Occupation: Land    Employer: [employer]  Tobacco Use   Smoking status: Never   Smokeless tobacco: Never  Vaping Use   Vaping Use: Never used  Substance and Sexual Activity   Alcohol use: No    Alcohol/week: 0.0 standard drinks   Drug use: No   Sexual activity: Not on file  Other Topics Concern   Not on file  Social History Narrative   [employer].    Married, 1 stepson      No living will   Wants wife to make decisions if he is not able--stepson is alternate   Would want resuscitation attempts   Not sure about tube feeds       Pt signed designated party release form and gives  Darious Rehman, spouse [phone], access to medical records. Can leave msg on answering machine.    Social Determinants of Health   Financial Resource Strain: Not on file  Food Insecurity: Not on file  Transportation Needs: Not on file  Physical Activity: Not on file  Stress: Not on file  Social Connections: Not on file  Intimate Partner Violence: Not on file   Review of Systems  Constitutional:  Negative for fatigue and unexpected weight change.       Wears seat belt  HENT:  Positive for tinnitus. Negative for hearing loss.        Keeps up with dentist  Eyes:  Negative for redness and visual disturbance.  Respiratory:  Negative for cough, chest tightness and shortness of breath.   Cardiovascular:  Negative for chest pain, palpitations and leg swelling.  Gastrointestinal:  Negative for blood in stool and constipation.       No heartburn  Endocrine: Negative for polydipsia and polyuria.  Genitourinary:  Positive for frequency. Negative for difficulty urinating.       No sexual problems  Musculoskeletal:  Negative for arthralgias, back pain and joint swelling.  Skin:  Negative for rash.  Allergic/Immunologic: Negative for environmental allergies and immunocompromised state.  Neurological:  Negative for dizziness, syncope, light-headedness and headaches.  Hematological:  Negative for adenopathy. Does not bruise/bleed easily.  Psychiatric/Behavioral:  Negative for dysphoric mood and sleep disturbance. The patient is not nervous/anxious.       Objective:   Physical Exam Constitutional:      Appearance: Normal appearance.  HENT:     Right Ear: Tympanic membrane and ear canal normal.     Left Ear: Tympanic membrane and ear canal normal.     Mouth/Throat:     Pharynx: No oropharyngeal exudate or posterior oropharyngeal erythema.  Eyes:     Conjunctiva/sclera: Conjunctivae normal.     Pupils: Pupils are equal, round, and reactive to light.  Cardiovascular:     Rate and Rhythm:  Normal rate and regular rhythm.     Pulses: Normal pulses.     Heart sounds:    No gallop.     Comments: Gr 2/6 coarse aortic systolic mummur Pulmonary:     Effort: Pulmonary effort is normal.     Breath sounds: Normal breath sounds. No wheezing or rales.  Abdominal:     Palpations: Abdomen is soft.     Tenderness: There is no abdominal tenderness.  Musculoskeletal:     Cervical back: Neck supple.     Right  lower leg: No edema.     Left lower leg: No edema.  Lymphadenopathy:     Cervical: No cervical adenopathy.  Skin:    Findings: No lesion or rash.     Comments: No foot lesions  Neurological:     General: No focal deficit present.     Mental Status: He is alert and oriented to person, place, and time.     Comments: Normal sensation in feet  Psychiatric:        Mood and Affect: Mood normal.        Behavior: Behavior normal.           Assessment & Plan:

## 2021-05-29 NOTE — Addendum Note (Signed)
Addended by: Pilar Grammes on: 05/29/2021 08:22 AM   Modules accepted: Orders

## 2021-05-29 NOTE — Assessment & Plan Note (Signed)
BP Readings from Last 3 Encounters:  05/29/21 138/78  03/11/21 118/78  11/05/20 (!) 146/80   Good control on lisinopril and metoprolol

## 2021-06-03 ENCOUNTER — Other Ambulatory Visit (HOSPITAL_COMMUNITY): Payer: Self-pay

## 2021-06-04 ENCOUNTER — Ambulatory Visit: Payer: BC Managed Care – PPO | Admitting: Internal Medicine

## 2021-06-04 ENCOUNTER — Other Ambulatory Visit: Payer: Self-pay

## 2021-06-04 ENCOUNTER — Encounter: Payer: Self-pay | Admitting: Internal Medicine

## 2021-06-04 VITALS — BP 120/80 | HR 67 | Ht 72.0 in | Wt 219.0 lb

## 2021-06-04 DIAGNOSIS — Q231 Congenital insufficiency of aortic valve: Secondary | ICD-10-CM | POA: Diagnosis not present

## 2021-06-04 DIAGNOSIS — I493 Ventricular premature depolarization: Secondary | ICD-10-CM

## 2021-06-04 DIAGNOSIS — I491 Atrial premature depolarization: Secondary | ICD-10-CM

## 2021-06-04 DIAGNOSIS — I712 Thoracic aortic aneurysm, without rupture, unspecified: Secondary | ICD-10-CM

## 2021-06-04 DIAGNOSIS — E785 Hyperlipidemia, unspecified: Secondary | ICD-10-CM

## 2021-06-04 DIAGNOSIS — I10 Essential (primary) hypertension: Secondary | ICD-10-CM

## 2021-06-04 DIAGNOSIS — E1169 Type 2 diabetes mellitus with other specified complication: Secondary | ICD-10-CM

## 2021-06-04 NOTE — Patient Instructions (Signed)
Medication Instructions:  Your physician recommends that you continue on your current medications as directed. Please refer to the Current Medication list given to you today.  *If you need a refill on your cardiac medications before your next appointment, please call your pharmacy*   Lab Work: None ordered If you have labs (blood work) drawn today and your tests are completely normal, you will receive your results only by: [place] (if you have MyChart) OR A paper copy in the mail If you have any lab test that is abnormal or we need to change your treatment, we will call you to review the results.   Testing/Procedures:  Please call [facility] Scheduling at [phone] to schedule your MRA of the chest for 02/2022   Day of your Test:   Hold your Metformin the day of your scan.   After the Test: Drink plenty of water. After receiving IV contrast, you may experience a mild flushed feeling. This is normal. On occasion, you may experience a mild rash up to 24 hours after the test. This is not dangerous. If this occurs, you can take Benadryl 25 mg and increase your fluid intake. If you experience trouble breathing, this can be serious. If it is severe call 911 IMMEDIATELY. If it is mild, please call our office. If you take any of these medications: Glipizide/Metformin, Avandament, Glucavance, please do not take 48 hours after completing test unless otherwise instructed.   Follow-Up: At [facility], you and your health needs are our priority.  As part of our continuing mission to provide you with exceptional heart care, we have created designated Provider Care Teams.  These Care Teams include your primary Cardiologist (physician) and Advanced Practice Providers (APPs -  Physician Assistants and Nurse Practitioners) who all work together to provide you with the care you need, when you need it.  We recommend signing up for the patient portal called "MyChart".  Sign up  information is provided on this After Visit Summary.  MyChart is used to connect with patients for Virtual Visits (Telemedicine).  Patients are able to view lab/test results, encounter notes, upcoming appointments, etc.  Non-urgent messages can be sent to your provider as well.   To learn more about what you can do with MyChart, go to [email or web address].    Your next appointment:   Follow up 1 year(s)  The format for your next appointment:   In Person  Provider:   You may see Nelva Bush, MD or one of the following Advanced Practice Providers on your designated Care Team:   Murray Hodgkins, NP Christell Faith, PA-C Marrianne Mood, PA-C Cadence Kathlen Mody, [place]   Other Instructions

## 2021-06-05 ENCOUNTER — Encounter: Payer: Self-pay | Admitting: Internal Medicine

## 2021-06-05 DIAGNOSIS — I491 Atrial premature depolarization: Secondary | ICD-10-CM | POA: Insufficient documentation

## 2021-06-05 DIAGNOSIS — I493 Ventricular premature depolarization: Secondary | ICD-10-CM | POA: Insufficient documentation

## 2021-06-05 NOTE — Progress Notes (Signed)
Follow-up Outpatient Visit Date: 06/04/2021  Primary Care Provider: Venia Carbon, MD [place] [address]  Chief Complaint: Follow-up bicuspid aortic valve and thoracic aortic aneurysm  HPI:  Timothy Bernard is a 65 y.o. male with history of  bicuspid aortic valve, thoracic aortic aneurysm, hypertension, hyperlipidemia, type 2 diabetes mellitus, and obstructive sleep apnea, who presents for follow-up of bicuspid aortic valve and thoracic aortic aneurysm.  He was last seen in our office by Dr. Rockey Situ in late December, at which time he was doing well.  Today, [name] is without complaints.  He has not had any chest pain, shortness of breath, palpitations, lightheadedness, or edema.  He is trying to walk some with his wife.  He does not check his blood pressures at home regularly.  --------------------------------------------------------------------------------------------------  Past Medical History:  Diagnosis Date   Adenomatous colon polyp 09/29/2007   56mm cecal adenomatous polyp 10 mm adenoama, post-polypectomy burn syndrome   Ascending aortic aneurysm    4.8 cm on ech (5/10), 4.6 x 4.6 cm by MRA 6/10 4.6 cm aortic root and ascending aorta by MRA 9/10. MRA chest (3/11) 4.5 ascending aorta. Stable by MRA 6/18   Bicuspid aortic valve    echo (5/10) with bicuspid AoV (upon re-review), mild LV dilation, EF 50-55%, no aortic stenosis, mild AI, moderate ascending aortic dilation (4.8 cm). 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 Echo (3/11) bicuspid aortic valve with no AS but mild AI, mild LVH, EF 60%.     Blood transfusion without reported diagnosis    Cataract    HTN (hypertension)    Hyperlipidemia    NIDDM (non-insulin dependent diabetes mellitus) 12/12   OSA on CPAP    Past Surgical History:  Procedure Laterality Date   COLONOSCOPY W/ BIOPSIES AND POLYPECTOMY  09/29/2007   Mole removed      Current Meds  Medication Sig   aspirin 81 MG tablet Take 81 mg by  mouth daily.   dapagliflozin propanediol (FARXIGA) 5 MG TABS tablet Take 1 tablet (5 mg total) by mouth daily before breakfast.   Flaxseed, Linseed, (FLAX SEED OIL) 1000 MG CAPS Take 1 capsule by mouth daily.   Lancets (ONETOUCH ULTRASOFT) lancets 1 each by Other route daily. Dx:250.00   lisinopril (ZESTRIL) 10 MG tablet TAKE 1 TABLET TWICE A DAY   metFORMIN (GLUCOPHAGE-XR) 500 MG 24 hr tablet TAKE 4 TABLETS BY MOUTH DAILY WITH BREAKFAST.   metoprolol succinate (TOPROL-XL) 50 MG 24 hr tablet TAKE 1 TABLET DAILY WITH OR IMMEDIATELY FOLLOWING A MEAL   ONETOUCH ULTRA test strip USE TO TEST BLOOD SUGAR ONCE DAILY E11.65   rosuvastatin (CRESTOR) 5 MG tablet TAKE 1 TABLET DAILY    Allergies: Patient has no known allergies.  Social History   Tobacco Use   Smoking status: Never   Smokeless tobacco: Never  Vaping Use   Vaping Use: Never used  Substance Use Topics   Alcohol use: No    Alcohol/week: 0.0 standard drinks   Drug use: No    Family History  Problem Relation Age of Onset   Hypertension Mother    Diabetes Mother    Cancer Father    Coronary artery disease Maternal Grandfather    Hypertension Other        maternal side   Colon cancer Neg Hx    Prostate cancer Neg Hx    Esophageal cancer Neg Hx    Liver cancer Neg Hx    Pancreatic cancer Neg Hx  Stomach cancer Neg Hx    Rectal cancer Neg Hx     Review of Systems: A 12-system review of systems was performed and was negative except as noted in the HPI.  --------------------------------------------------------------------------------------------------  Physical Exam: BP 120/80 (BP Location: Left Arm, Patient Position: Sitting, Cuff Size: Large)   Pulse 67   Ht 6' (1.829 m)   Wt 219 lb (99.3 kg)   SpO2 97%   BMI 29.70 kg/m   General:  NAD. Neck: No JVD or HJR. Lungs: Clear to auscultation bilaterally without wheezes or crackles. Heart: Regular rate and rhythm with 2/6 systolic murmur.  No rubs or  gallops. Abdomen: Soft, nontender, nondistended. Extremities: No lower extremity edema.  EKG: Normal sinus rhythm with first-degree AV block (PR interval 220 ms) and left axis deviation.  Compared with prior tracing from 08/28/2020, PVCs and PACs are no longer present.  Lab Results  Component Value Date   WBC 6.8 05/29/2021   HGB 14.6 05/29/2021   HCT 45.2 05/29/2021   MCV 92.6 05/29/2021   PLT 204.0 05/29/2021    Lab Results  Component Value Date   NA 139 05/29/2021   K 4.0 05/29/2021   CL 105 05/29/2021   CO2 26 05/29/2021   BUN 14 05/29/2021   CREATININE 1.12 05/29/2021   GLUCOSE 193 (H) 05/29/2021   ALT 22 05/29/2021    Lab Results  Component Value Date   CHOL 114 05/29/2021   HDL 32.60 (L) 05/29/2021   LDLCALC 48 05/29/2021   LDLDIRECT 159.0 08/09/2007   TRIG 164.0 (H) 05/29/2021   CHOLHDL 3 05/29/2021    --------------------------------------------------------------------------------------------------  ASSESSMENT AND PLAN: Bicuspid aortic valve and thoracic aortic aneurysm: Timothy Bernard remains asymptomatic.  Echocardiogram in 03/2019 showed mild regurgitation and stenosis.  Thoracic aortic aneurysm has been stable in size on serial car MRI evaluation, most recently this past July.  We will plan to repeat an MRA next summer.  Repeat echo will be considered in 2 years from his last study unless symptoms develop in the meantime.  We will continue blood pressure control and statin therapy in the setting of TIA.  PACs and PVCs: No symptoms reported with EKG today showing normal sinus rhythm without ectopy.  We will plan to continue current dose of metoprolol.  Hyperlipidemia associated with type 2 diabetes mellitus: LDL well controlled on last check about a week ago.  Continue low-dose rosuvastatin.  Ongoing management of diabetes per Dr. Loanne Drilling.  Hypertension: Blood pressure well controlled today.  Continue current doses of metoprolol and lisinopril.  Follow-up:  Return to clinic in 1 year.  Nelva Bush, MD 06/05/2021 7:57 AM

## 2021-06-15 ENCOUNTER — Other Ambulatory Visit: Payer: Self-pay

## 2021-06-15 ENCOUNTER — Ambulatory Visit: Payer: BC Managed Care – PPO | Admitting: Endocrinology

## 2021-06-15 VITALS — BP 110/80 | HR 76 | Ht 72.0 in | Wt 212.4 lb

## 2021-06-15 DIAGNOSIS — E119 Type 2 diabetes mellitus without complications: Secondary | ICD-10-CM | POA: Diagnosis not present

## 2021-06-15 LAB — POCT GLYCOSYLATED HEMOGLOBIN (HGB A1C): Hemoglobin A1C: 8.5 % — AB (ref 4.0–5.6)

## 2021-06-15 MED ORDER — DAPAGLIFLOZIN PROPANEDIOL 5 MG PO TABS: ORAL_TABLET | ORAL | 3 refills | Status: DC

## 2021-06-15 MED ORDER — OZEMPIC (0.25 OR 0.5 MG/DOSE) 2 MG/1.5ML ~~LOC~~ SOPN
0.5000 mg | PEN_INJECTOR | SUBCUTANEOUS | 3 refills | Status: DC
Start: 2021-06-15 — End: 2021-09-24
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
## 2021-06-15 NOTE — Patient Instructions (Addendum)
check your blood sugar once a day.  vary the time of day when you check, between before the 3 meals, and at bedtime.  also check if you have symptoms of your blood sugar being too high or too low.  please keep a record of the readings and bring it to your next appointment here (or you can bring the meter itself).  You can write it on any piece of paper.  please call us sooner if your blood sugar goes below 70, or if you have a lot of readings over 200.   I have sent a prescription to your pharmacy, for the [place], and to reduce the Farxiga to 1/2 pill daily   Please continue the same metformin.  Please come back for a follow-up appointment in 3 months.

## 2021-07-06 ENCOUNTER — Other Ambulatory Visit: Payer: Self-pay | Admitting: Endocrinology

## 2021-08-04 ENCOUNTER — Other Ambulatory Visit: Payer: Self-pay | Admitting: Endocrinology

## 2021-08-25 ENCOUNTER — Other Ambulatory Visit: Payer: Self-pay | Admitting: Internal Medicine

## 2021-08-30 DIAGNOSIS — H3321 Serous retinal detachment, right eye: Secondary | ICD-10-CM

## 2021-08-30 HISTORY — DX: Serous retinal detachment, right eye: H33.21

## 2021-09-24 ENCOUNTER — Ambulatory Visit: Payer: Medicare HMO | Admitting: Endocrinology

## 2021-09-24 ENCOUNTER — Other Ambulatory Visit: Payer: Self-pay

## 2021-09-24 VITALS — BP 110/60 | HR 66 | Ht 72.0 in | Wt 216.2 lb

## 2021-09-24 DIAGNOSIS — E119 Type 2 diabetes mellitus without complications: Secondary | ICD-10-CM | POA: Diagnosis not present

## 2021-09-24 LAB — POCT GLYCOSYLATED HEMOGLOBIN (HGB A1C): Hemoglobin A1C: 8.3 % — AB (ref 4.0–5.6)

## 2021-09-24 MED ORDER — CANAGLIFLOZIN 300 MG PO TABS: 300.0000 mg | ORAL_TABLET | Freq: Every day | ORAL | 3 refills | Status: DC

## 2021-09-24 MED ORDER — OZEMPIC (0.25 OR 0.5 MG/DOSE) 2 MG/1.5ML ~~LOC~~ SOPN: 0.2500 mg | PEN_INJECTOR | SUBCUTANEOUS | 3 refills | Status: DC

## 2021-09-24 NOTE — Patient Instructions (Addendum)
check your blood sugar once a day.  vary the time of day when you check, between before the 3 meals, and at bedtime.  also check if you have symptoms of your blood sugar being too high or too low.  please keep a record of the readings and bring it to your next appointment here (or you can bring the meter itself).  You can write it on any piece of paper.  please call us sooner if your blood sugar goes below 70, or if you have a lot of readings over 200.   I have sent a prescription to your pharmacy, for the [place], and to change the [place] to [place].   Please continue the same metformin.   Please come back for a follow-up appointment in 3 months.
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
## 2021-10-06 ENCOUNTER — Telehealth: Payer: Self-pay | Admitting: Internal Medicine

## 2021-10-06 MED ORDER — ROSUVASTATIN CALCIUM 5 MG PO TABS: 5.0000 mg | ORAL_TABLET | Freq: Every day | ORAL | 3 refills | Status: DC

## 2021-10-06 MED ORDER — METOPROLOL SUCCINATE ER 50 MG PO TB24: ORAL_TABLET | ORAL | 3 refills | Status: DC

## 2021-10-06 NOTE — Telephone Encounter (Signed)
°  Encourage patient to contact the pharmacy for refills or they can request refills through [facility]:  Please schedule appointment if longer than 1 year  NEXT APPOINTMENT DATE:  MEDICATION:metoprolol succinate (TOPROL-XL) 50 MG 24 hr tablet,rosuvastatin (CRESTOR) 5 MG tablet  Is the patient out of medication?   [facility] P.[address]. [address] [place] [phone]  Let patient know to contact pharmacy at the end of the day to make sure medication is ready.  Please notify patient to allow 48-72 hours to process  CLINICAL FILLS OUT ALL BELOW:   LAST REFILL:  QTY:  REFILL DATE:    OTHER COMMENTS:    Okay for refill?  Please advise

## 2021-10-06 NOTE — Telephone Encounter (Signed)
RXS sent to [facility]

## 2021-10-16 ENCOUNTER — Telehealth: Payer: Self-pay | Admitting: Internal Medicine

## 2021-10-16 NOTE — Telephone Encounter (Signed)
Pt needs tru matrix meter kit sent in this is the one his insurance covers now, please send to [facility]

## 2021-10-19 MED ORDER — TRUE METRIX BLOOD GLUCOSE TEST VI STRP: ORAL_STRIP | 12 refills | Status: DC

## 2021-10-19 MED ORDER — TRUE METRIX METER W/DEVICE KIT: 1.0000 | PACK | Freq: Once | 0 refills | Status: AC

## 2021-10-19 NOTE — Addendum Note (Signed)
Addended by: Pilar Grammes on: 10/19/2021 03:17 PM   Modules accepted: Orders

## 2021-11-05 ENCOUNTER — Other Ambulatory Visit: Payer: Self-pay | Admitting: Endocrinology

## 2021-11-18 DIAGNOSIS — H04123 Dry eye syndrome of bilateral lacrimal glands: Secondary | ICD-10-CM | POA: Diagnosis not present

## 2021-11-18 DIAGNOSIS — E119 Type 2 diabetes mellitus without complications: Secondary | ICD-10-CM | POA: Diagnosis not present

## 2021-11-18 DIAGNOSIS — H26493 Other secondary cataract, bilateral: Secondary | ICD-10-CM | POA: Diagnosis not present

## 2021-11-18 DIAGNOSIS — H33021 Retinal detachment with multiple breaks, right eye: Secondary | ICD-10-CM | POA: Diagnosis not present

## 2021-11-19 DIAGNOSIS — H43813 Vitreous degeneration, bilateral: Secondary | ICD-10-CM | POA: Diagnosis not present

## 2021-11-19 DIAGNOSIS — H43393 Other vitreous opacities, bilateral: Secondary | ICD-10-CM | POA: Diagnosis not present

## 2021-11-19 DIAGNOSIS — H33021 Retinal detachment with multiple breaks, right eye: Secondary | ICD-10-CM | POA: Diagnosis not present

## 2021-11-20 DIAGNOSIS — H33021 Retinal detachment with multiple breaks, right eye: Secondary | ICD-10-CM | POA: Diagnosis not present

## 2021-11-20 DIAGNOSIS — H3341 Traction detachment of retina, right eye: Secondary | ICD-10-CM | POA: Diagnosis not present

## 2021-11-20 DIAGNOSIS — H3321 Serous retinal detachment, right eye: Secondary | ICD-10-CM | POA: Diagnosis not present

## 2021-11-20 DIAGNOSIS — G4730 Sleep apnea, unspecified: Secondary | ICD-10-CM | POA: Diagnosis not present

## 2021-11-20 DIAGNOSIS — I10 Essential (primary) hypertension: Secondary | ICD-10-CM | POA: Diagnosis not present

## 2021-11-20 DIAGNOSIS — E119 Type 2 diabetes mellitus without complications: Secondary | ICD-10-CM | POA: Diagnosis not present

## 2021-11-20 DIAGNOSIS — E113591 Type 2 diabetes mellitus with proliferative diabetic retinopathy without macular edema, right eye: Secondary | ICD-10-CM | POA: Diagnosis not present

## 2021-11-20 DIAGNOSIS — Z79899 Other long term (current) drug therapy: Secondary | ICD-10-CM | POA: Diagnosis not present

## 2021-11-20 DIAGNOSIS — Z7985 Long-term (current) use of injectable non-insulin antidiabetic drugs: Secondary | ICD-10-CM | POA: Diagnosis not present

## 2021-11-27 DIAGNOSIS — H33021 Retinal detachment with multiple breaks, right eye: Secondary | ICD-10-CM | POA: Diagnosis not present

## 2021-12-16 DIAGNOSIS — H3341 Traction detachment of retina, right eye: Secondary | ICD-10-CM | POA: Diagnosis not present

## 2021-12-24 DIAGNOSIS — H3341 Traction detachment of retina, right eye: Secondary | ICD-10-CM | POA: Diagnosis not present

## 2021-12-24 DIAGNOSIS — H33021 Retinal detachment with multiple breaks, right eye: Secondary | ICD-10-CM | POA: Diagnosis not present

## 2021-12-30 ENCOUNTER — Ambulatory Visit: Payer: Medicare HMO | Admitting: Endocrinology

## 2021-12-30 ENCOUNTER — Encounter: Payer: Self-pay | Admitting: Endocrinology

## 2021-12-30 VITALS — BP 126/82 | HR 70 | Ht 72.0 in | Wt 210.6 lb

## 2021-12-30 DIAGNOSIS — E1165 Type 2 diabetes mellitus with hyperglycemia: Secondary | ICD-10-CM

## 2021-12-30 DIAGNOSIS — R739 Hyperglycemia, unspecified: Secondary | ICD-10-CM

## 2021-12-30 LAB — POCT GLYCOSYLATED HEMOGLOBIN (HGB A1C): Hemoglobin A1C: 7.9 % — AB (ref 4.0–5.6)

## 2021-12-30 MED ORDER — RYBELSUS 3 MG PO TABS: 3.0000 mg | ORAL_TABLET | Freq: Every day | ORAL | 11 refills | Status: DC

## 2021-12-30 NOTE — Patient Instructions (Addendum)
check your blood sugar once a day.  vary the time of day when you check, between before the 3 meals, and at bedtime.  also check if you have symptoms of your blood sugar being too high or too low.  please keep a record of the readings and bring it to your next appointment here (or you can bring the meter itself).  You can write it on any piece of paper.  please call us sooner if your blood sugar goes below 70, or if you have a lot of readings over 200.   ?I have sent a prescription to your pharmacy, to change Ozempic to Rybelsus.   ?Please continue the same other medications.   ?You should have an endocrinology follow-up appointment in 3 months.   ?
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
## 2021-12-31 DIAGNOSIS — H33021 Retinal detachment with multiple breaks, right eye: Secondary | ICD-10-CM | POA: Diagnosis not present

## 2021-12-31 DIAGNOSIS — H3341 Traction detachment of retina, right eye: Secondary | ICD-10-CM | POA: Diagnosis not present

## 2022-01-14 DIAGNOSIS — H43812 Vitreous degeneration, left eye: Secondary | ICD-10-CM | POA: Diagnosis not present

## 2022-01-28 DIAGNOSIS — G459 Transient cerebral ischemic attack, unspecified: Secondary | ICD-10-CM

## 2022-01-28 HISTORY — DX: Transient cerebral ischemic attack, unspecified: G45.9

## 2022-02-08 DIAGNOSIS — H43812 Vitreous degeneration, left eye: Secondary | ICD-10-CM | POA: Diagnosis not present

## 2022-02-09 ENCOUNTER — Observation Stay (HOSPITAL_COMMUNITY): Payer: Medicare HMO

## 2022-02-09 ENCOUNTER — Other Ambulatory Visit: Payer: Self-pay

## 2022-02-09 ENCOUNTER — Emergency Department (HOSPITAL_COMMUNITY): Payer: Medicare HMO

## 2022-02-09 ENCOUNTER — Encounter (HOSPITAL_COMMUNITY): Payer: Self-pay | Admitting: Emergency Medicine

## 2022-02-09 ENCOUNTER — Encounter: Payer: Self-pay | Admitting: Emergency Medicine

## 2022-02-09 ENCOUNTER — Observation Stay (HOSPITAL_COMMUNITY)
Admission: EM | Admit: 2022-02-09 | Discharge: 2022-02-10 | Disposition: A | Discharge status: Home / Self Care | Payer: Medicare HMO | Attending: Internal Medicine | Admitting: Internal Medicine

## 2022-02-09 ENCOUNTER — Ambulatory Visit
Admission: EM | Admit: 2022-02-09 | Discharge: 2022-02-09 | Disposition: A | Discharge status: Nursing Home (Includes ICF) | Payer: Medicare HMO

## 2022-02-09 DIAGNOSIS — Z7984 Long term (current) use of oral hypoglycemic drugs: Secondary | ICD-10-CM | POA: Insufficient documentation

## 2022-02-09 DIAGNOSIS — R29818 Other symptoms and signs involving the nervous system: Secondary | ICD-10-CM | POA: Diagnosis not present

## 2022-02-09 DIAGNOSIS — R4781 Slurred speech: Secondary | ICD-10-CM | POA: Diagnosis not present

## 2022-02-09 DIAGNOSIS — E1159 Type 2 diabetes mellitus with other circulatory complications: Secondary | ICD-10-CM | POA: Insufficient documentation

## 2022-02-09 DIAGNOSIS — E1165 Type 2 diabetes mellitus with hyperglycemia: Secondary | ICD-10-CM | POA: Diagnosis present

## 2022-02-09 DIAGNOSIS — Z7985 Long-term (current) use of injectable non-insulin antidiabetic drugs: Secondary | ICD-10-CM | POA: Diagnosis not present

## 2022-02-09 DIAGNOSIS — Z79899 Other long term (current) drug therapy: Secondary | ICD-10-CM | POA: Diagnosis not present

## 2022-02-09 DIAGNOSIS — E1169 Type 2 diabetes mellitus with other specified complication: Secondary | ICD-10-CM | POA: Diagnosis not present

## 2022-02-09 DIAGNOSIS — E785 Hyperlipidemia, unspecified: Secondary | ICD-10-CM

## 2022-02-09 DIAGNOSIS — Z7982 Long term (current) use of aspirin: Secondary | ICD-10-CM | POA: Diagnosis not present

## 2022-02-09 DIAGNOSIS — I2510 Atherosclerotic heart disease of native coronary artery without angina pectoris: Secondary | ICD-10-CM | POA: Insufficient documentation

## 2022-02-09 DIAGNOSIS — R531 Weakness: Secondary | ICD-10-CM

## 2022-02-09 DIAGNOSIS — I359 Nonrheumatic aortic valve disorder, unspecified: Secondary | ICD-10-CM | POA: Diagnosis present

## 2022-02-09 DIAGNOSIS — Z8673 Personal history of transient ischemic attack (TIA), and cerebral infarction without residual deficits: Secondary | ICD-10-CM | POA: Diagnosis present

## 2022-02-09 DIAGNOSIS — G459 Transient cerebral ischemic attack, unspecified: Secondary | ICD-10-CM | POA: Diagnosis not present

## 2022-02-09 DIAGNOSIS — I6782 Cerebral ischemia: Secondary | ICD-10-CM | POA: Diagnosis not present

## 2022-02-09 DIAGNOSIS — I10 Essential (primary) hypertension: Secondary | ICD-10-CM | POA: Diagnosis not present

## 2022-02-09 DIAGNOSIS — E11319 Type 2 diabetes mellitus with unspecified diabetic retinopathy without macular edema: Secondary | ICD-10-CM | POA: Diagnosis not present

## 2022-02-09 DIAGNOSIS — R202 Paresthesia of skin: Secondary | ICD-10-CM | POA: Diagnosis present

## 2022-02-09 DIAGNOSIS — I712 Thoracic aortic aneurysm, without rupture, unspecified: Secondary | ICD-10-CM | POA: Diagnosis present

## 2022-02-09 DIAGNOSIS — R200 Anesthesia of skin: Secondary | ICD-10-CM | POA: Diagnosis not present

## 2022-02-09 DIAGNOSIS — I6523 Occlusion and stenosis of bilateral carotid arteries: Secondary | ICD-10-CM | POA: Diagnosis not present

## 2022-02-09 DIAGNOSIS — G4733 Obstructive sleep apnea (adult) (pediatric): Secondary | ICD-10-CM | POA: Diagnosis present

## 2022-02-09 DIAGNOSIS — E119 Type 2 diabetes mellitus without complications: Secondary | ICD-10-CM | POA: Diagnosis not present

## 2022-02-09 LAB — CBC
HCT: 46.8 % (ref 39.0–52.0)
Hemoglobin: 15.5 g/dL (ref 13.0–17.0)
MCH: 30.8 pg (ref 26.0–34.0)
MCHC: 33.1 g/dL (ref 30.0–36.0)
MCV: 93 fL (ref 80.0–100.0)
Platelets: 249 10*3/uL (ref 150–400)
RBC: 5.03 MIL/uL (ref 4.22–5.81)
RDW: 13.9 % (ref 11.5–15.5)
WBC: 9.9 10*3/uL (ref 4.0–10.5)
nRBC: 0 % (ref 0.0–0.2)

## 2022-02-09 LAB — COMPREHENSIVE METABOLIC PANEL
ALT: 20 U/L (ref 0–44)
AST: 19 U/L (ref 15–41)
Albumin: 4.1 g/dL (ref 3.5–5.0)
Alkaline Phosphatase: 63 U/L (ref 38–126)
Anion gap: 7 (ref 5–15)
BUN: 20 mg/dL (ref 8–23)
CO2: 24 mmol/L (ref 22–32)
Calcium: 9.1 mg/dL (ref 8.9–10.3)
Chloride: 108 mmol/L (ref 98–111)
Creatinine, Ser: 1.29 mg/dL — ABNORMAL HIGH (ref 0.61–1.24)
GFR, Estimated: >60 mL/min (ref >60)
Glucose, Bld: 131 mg/dL — ABNORMAL HIGH (ref 70–99)
Potassium: 4.1 mmol/L (ref 3.5–5.1)
Sodium: 139 mmol/L (ref 135–145)
Total Bilirubin: 0.6 mg/dL (ref 0.3–1.2)
Total Protein: 7.7 g/dL (ref 6.5–8.1)

## 2022-02-09 LAB — URINALYSIS, ROUTINE W REFLEX MICROSCOPIC
Bacteria, UA: NONE SEEN
Bilirubin Urine: NEGATIVE
Glucose, UA: >=500 mg/dL — AB
Hgb urine dipstick: NEGATIVE
Ketones, ur: 5 mg/dL — AB
Leukocytes,Ua: NEGATIVE
Nitrite: NEGATIVE
Protein, ur: NEGATIVE mg/dL
Specific Gravity, Urine: 1.023 (ref 1.005–1.030)
pH: 5 (ref 5.0–8.0)

## 2022-02-09 LAB — RAPID URINE DRUG SCREEN, HOSP PERFORMED
Amphetamines: NOT DETECTED
Barbiturates: NOT DETECTED
Benzodiazepines: NOT DETECTED
Cocaine: NOT DETECTED
Opiates: NOT DETECTED
Tetrahydrocannabinol: NOT DETECTED

## 2022-02-09 LAB — GLUCOSE, CAPILLARY
Glucose-Capillary: 110 mg/dL — ABNORMAL HIGH (ref 70–99)
Glucose-Capillary: 140 mg/dL — ABNORMAL HIGH (ref 70–99)

## 2022-02-09 LAB — ETHANOL: Alcohol, Ethyl (B): <10 mg/dL (ref <10)

## 2022-02-09 LAB — HIV ANTIBODY (ROUTINE TESTING W REFLEX): HIV Screen 4th Generation wRfx: NONREACTIVE

## 2022-02-09 LAB — CBG MONITORING, ED: Glucose-Capillary: 124 mg/dL — ABNORMAL HIGH (ref 70–99)

## 2022-02-09 MED ORDER — SENNOSIDES-DOCUSATE SODIUM 8.6-50 MG PO TABS: 1.0000 | ORAL_TABLET | Freq: Every evening | ORAL | Status: DC | PRN

## 2022-02-09 MED ORDER — CLOPIDOGREL BISULFATE 75 MG PO TABS
75.0000 mg | ORAL_TABLET | Freq: Every day | ORAL | Status: DC
  Administered 2022-02-10: 75 mg via ORAL
  Filled 2022-02-09: qty 1

## 2022-02-09 MED ORDER — ACETAMINOPHEN 650 MG RE SUPP: 650.0000 mg | RECTAL | Status: DC | PRN

## 2022-02-09 MED ORDER — ROSUVASTATIN CALCIUM 10 MG PO TABS
5.0000 mg | ORAL_TABLET | Freq: Every day | ORAL | Status: DC
  Administered 2022-02-09 – 2022-02-10 (×2): 5 mg via ORAL
  Filled 2022-02-09 (×2): qty 1

## 2022-02-09 MED ORDER — LISINOPRIL 10 MG PO TABS
10.0000 mg | ORAL_TABLET | Freq: Every day | ORAL | Status: DC
  Administered 2022-02-09: 10 mg via ORAL
  Filled 2022-02-09: qty 1

## 2022-02-09 MED ORDER — METOPROLOL SUCCINATE ER 50 MG PO TB24
50.0000 mg | ORAL_TABLET | Freq: Every day | ORAL | Status: DC
  Administered 2022-02-09 – 2022-02-10 (×2): 50 mg via ORAL
  Filled 2022-02-09 (×2): qty 1

## 2022-02-09 MED ORDER — ACETAMINOPHEN 160 MG/5ML PO SOLN: 650.0000 mg | ORAL | Status: DC | PRN

## 2022-02-09 MED ORDER — SODIUM CHLORIDE 0.9 % IV SOLN: INTRAVENOUS | Status: AC

## 2022-02-09 MED ORDER — LISINOPRIL 5 MG PO TABS: 5.0000 mg | ORAL_TABLET | Freq: Every day | ORAL | Status: DC

## 2022-02-09 MED ORDER — INSULIN ASPART 100 UNIT/ML IJ SOLN
0.0000 [IU] | Freq: Three times a day (TID) | INTRAMUSCULAR | Status: DC
  Administered 2022-02-10 (×2): 2 [IU] via SUBCUTANEOUS

## 2022-02-09 MED ORDER — ENOXAPARIN SODIUM 40 MG/0.4ML IJ SOSY
40.0000 mg | PREFILLED_SYRINGE | INTRAMUSCULAR | Status: DC
  Filled 2022-02-09: qty 0.4

## 2022-02-09 MED ORDER — ACETAMINOPHEN 325 MG PO TABS: 650.0000 mg | ORAL_TABLET | ORAL | Status: DC | PRN

## 2022-02-09 MED ORDER — CLOPIDOGREL BISULFATE 75 MG PO TABS
300.0000 mg | ORAL_TABLET | Freq: Once | ORAL | Status: AC
  Administered 2022-02-09: 300 mg via ORAL
  Filled 2022-02-09: qty 4

## 2022-02-09 MED ORDER — STROKE: EARLY STAGES OF RECOVERY BOOK
Freq: Once | Status: AC
  Filled 2022-02-09: qty 1

## 2022-02-09 NOTE — Progress Notes (Addendum)
Alert and oriented with no deficits since admission.  Right pupil size 3 and nonreactive to light but patient and wife stated doctor dilated eye yesterday and patient had a recent retinal detachment.  No vision deficits at this time.  Lovenox 40 and plavix 300 were due and Dr. Wynetta Emery ordered to hold lovenox until tomorrow.  Wife at bedside and patient eating supper.

## 2022-02-09 NOTE — ED Triage Notes (Addendum)
Pt reports sudden onset left sided facial numbness, upper and lower extremity numbness at 930 this am that last approximately 5 minutes.   Pt reports is at baseline at this time and reports is diabetic but CBG was 80 this am. Pt alert and oriented x4. Speech clear. Gait is steady.

## 2022-02-09 NOTE — ED Provider Notes (Signed)
[facility] EMERGENCY DEPARTMENT Provider Note   CSN: [id number] Arrival date & time: 02/09/22  1224     History  Chief Complaint  Patient presents with   Numbness    Timothy Bernard is a 66 y.o. male.  Patient with known type 2 diabetes, lipids, family history of coronary artery disease presents with left facial numbness, left arm and leg weakness and numbness around 930 lasting 5 to 10 minutes.  Fortunately symptoms signs have resolved.  No history of similar.  No known stroke history.  No anticoagulant use.  Last known normal 9:00 this morning.       Home Medications Prior to Admission medications   Medication Sig Start Date End Date Taking? Authorizing Provider  aspirin 81 MG tablet Take 81 mg by mouth daily.    [provider]  canagliflozin (INVOKANA) 300 MG TABS tablet Take 1 tablet (300 mg total) by mouth daily before breakfast. 09/24/21   Renato Shin, MD  dapagliflozin propanediol (FARXIGA) 5 MG TABS tablet Take by mouth daily.    [provider]  Flaxseed, Linseed, (FLAX SEED OIL) 1000 MG CAPS Take 1 capsule by mouth daily.    [provider]  glucose blood (TRUE METRIX BLOOD GLUCOSE TEST) test strip Use to check blood sugar once daily. 10/19/21   Venia Carbon, MD  Lancets [facility] ULTRASOFT) lancets 1 each by Other route daily. Dx:250.00 05/23/14   Viviana Simpler I, MD  lisinopril (ZESTRIL) 10 MG tablet TAKE 1 TABLET TWICE A DAY 02/18/21   Viviana Simpler I, MD  metFORMIN (GLUCOPHAGE-XR) 500 MG 24 hr tablet TAKE 4 TABLETS BY MOUTH DAILY WITH BREAKFAST. 11/05/21   Renato Shin, MD  metoprolol succinate (TOPROL-XL) 50 MG 24 hr tablet TAKE 1 TABLET DAILY WITH OR IMMEDIATELY FOLLOWING A MEAL 10/06/21   Venia Carbon, MD  [facility] ULTRA test strip USE TO TEST BLOOD SUGAR ONCE DAILY E11.65 08/25/21   Viviana Simpler I, MD  rosuvastatin (CRESTOR) 5 MG tablet Take 1 tablet (5 mg total) by mouth daily. 10/06/21   Venia Carbon, MD  Semaglutide  (RYBELSUS) 3 MG TABS Take 3 mg by mouth daily. 12/30/21   Renato Shin, MD      Allergies    Patient has no known allergies.    Review of Systems   Review of Systems  Constitutional:  Negative for chills and fever.  HENT:  Negative for congestion.   Eyes:  Negative for visual disturbance.  Respiratory:  Negative for shortness of breath.   Cardiovascular:  Negative for chest pain.  Gastrointestinal:  Negative for abdominal pain and vomiting.  Genitourinary:  Negative for dysuria and flank pain.  Musculoskeletal:  Negative for back pain, neck pain and neck stiffness.  Skin:  Negative for rash.  Neurological:  Positive for weakness and numbness. Negative for light-headedness and headaches.    Physical Exam Updated Vital Signs BP 131/73   Pulse 76   Temp 98.2 F (36.8 C) (Oral)   Resp 19   Ht 6' (1.829 m)   Wt 96.8 kg   SpO2 95%   BMI 28.94 kg/m  Physical Exam Vitals and nursing note reviewed.  Constitutional:      General: He is not in acute distress.    Appearance: He is well-developed.  HENT:     Head: Normocephalic and atraumatic.     Mouth/Throat:     Mouth: Mucous membranes are moist.  Eyes:     General:  Right eye: No discharge.        Left eye: No discharge.     Conjunctiva/sclera: Conjunctivae normal.  Neck:     Trachea: No tracheal deviation.  Cardiovascular:     Rate and Rhythm: Normal rate and regular rhythm.     Heart sounds: No murmur heard. Pulmonary:     Effort: Pulmonary effort is normal.     Breath sounds: Normal breath sounds.  Abdominal:     General: There is no distension.     Palpations: Abdomen is soft.     Tenderness: There is no abdominal tenderness. There is no guarding.  Musculoskeletal:        General: No swelling.     Cervical back: Normal range of motion and neck supple. No rigidity.  Skin:    General: Skin is warm.     Capillary Refill: Capillary refill takes less than 2 seconds.     Findings: No rash.  Neurological:      General: No focal deficit present.     Mental Status: He is alert.     GCS: GCS eye subscore is 4. GCS verbal subscore is 5. GCS motor subscore is 6.     Cranial Nerves: No cranial nerve deficit.     Sensory: No sensory deficit.     Motor: No weakness.     Coordination: Coordination normal.     Comments: 5+ strength in UE and LE with f/e at major joints. Sensation to palpation intact in UE and LE. CNs 2-12 grossly intact.  EOMFI.  PERRL.   Finger nose and coordination intact bilateral.   Visual fields intact to finger testing. No nystagmus   Psychiatric:        Mood and Affect: Mood normal.     ED Results / Procedures / Treatments   Labs (all labs ordered are listed, but only abnormal results are displayed) Labs Reviewed  COMPREHENSIVE METABOLIC PANEL - Abnormal; Notable for the following components:      Result Value   Glucose, Bld 131 (*)    Creatinine, Ser 1.29 (*)    All other components within normal limits  CBG MONITORING, ED - Abnormal; Notable for the following components:   Glucose-Capillary 124 (*)    All other components within normal limits  CBC  ETHANOL  RAPID URINE DRUG SCREEN, HOSP PERFORMED  URINALYSIS, ROUTINE W REFLEX MICROSCOPIC    EKG None  Radiology CT HEAD WO CONTRAST  Result Date: 02/09/2022 CLINICAL DATA:  Neuro deficit, acute, stroke suspected, slurred speech; left-sided facial numbness EXAM: CT HEAD WITHOUT CONTRAST TECHNIQUE: Contiguous axial images were obtained from the base of the skull through the vertex without intravenous contrast. RADIATION DOSE REDUCTION: This exam was performed according to the departmental dose-optimization program which includes automated exposure control, adjustment of the mA and/or kV according to patient size and/or use of iterative reconstruction technique. COMPARISON:  None Available. FINDINGS: Brain: There is no acute intracranial hemorrhage, mass effect, or edema. Gray-white differentiation is preserved. There  is no extra-axial fluid collection. Prominence of the ventricles and sulci reflects minor parenchymal volume loss. Patchy hypoattenuation in the supratentorial white matter is nonspecific but may reflect mild chronic microvascular ischemic changes. There is an age-indeterminate small vessel infarct of left gangliocapsular region. Vascular: There is atherosclerotic calcification at the skull base. Skull: Calvarium is unremarkable. Sinuses/Orbits: No acute finding. Other: None. IMPRESSION: No acute intracranial hemorrhage. Age-indeterminate small vessel infarct of the left gangliocapsular region. Mild chronic microvascular ischemic changes. Electronically Signed  By: Macy Mis M.D.   On: 02/09/2022 13:34    Procedures Procedures    Medications Ordered in ED Medications - No data to display  ED Course/ Medical Decision Making/ A&P                           Medical Decision Making Amount and/or Complexity of Data Reviewed Labs: ordered. Radiology: ordered. ECG/medicine tests: ordered.  Risk Decision regarding hospitalization.   Patient presents after episode of slurred speech, left-sided numbness and weakness that has since resolved.  Discussed clinical concern for TIA versus small stroke.  Blood work ordered reviewed over unremarkable electrolytes reviewed, glucose normal, creatinine 1.2 similar to previous.  CT scan of the head ordered and reviewed results no acute bleeding or abnormalities.  Discussed with neurology recommended hospitalist admit for MRI, echo and further stroke work-up given strong clinical suspicion.  Discussed hospitalist who agrees with plan of care.         Final Clinical Impression(s) / ED Diagnoses Final diagnoses:  TIA (transient ischemic attack)  Left-sided weakness    Rx / DC Orders ED Discharge Orders     None         Elnora Morrison, MD 02/09/22 1733

## 2022-02-09 NOTE — Assessment & Plan Note (Signed)
--   presumably this is a TIA as symptoms only lasted 5 minutes and he has returned to his baseline status  -- full TIA work up -- neuro checks as ordered -- loaded with clopidogrel followed by daily 75 mg tablet

## 2022-02-09 NOTE — Assessment & Plan Note (Signed)
--   appreciate neurologist input Dr. Curly Bernard and Timothy Bernard -- pt loaded with plavix 300 mg x 1  -- continue plavix 75 mg daily and ASA 81 mg x 21 days, then ASA 81 alone -- full TIA work up ordered.  -- follow up MRI brain--neg for acute infarct --MRA brain--no significant intracranial stenosis -- carotid dopplers --no hemodynamically significant stenosis -- follow up lipid panel--LDL 41 -- A1c 7.9%  -- PT/OT evaluation--no follow up --appreciate Dr. Hortense Bernard -- 30 day event monitor to be set up after d/c

## 2022-02-09 NOTE — Hospital Course (Signed)
66 year old gentleman with type 2 diabetes mellitus uncontrolled with diabetic retinopathy, hyperlipidemia, hypertension, aortic valve disease, OSA, ascending aortic aneurysm presented to the [facility] urgent care with complaints of sudden onset left-sided facial numbness upper and lower extremity numbness that started at 930 this morning when he was at work.  The symptoms lasted about 5 minutes.  He tested his blood glucose and it was 80.  He had reportedly slightly slurred speech at the time.  This has resolved as well.  He had no difficulty walking and no fall.  He denies dizziness headache and vision changes.  The urgent care provider recommended he go to the emergency department over concern for TIA symptoms.  He was sent for CT with no acute findings found.  Neurology was contacted and they have recommended that he have a full stroke work-up and be loaded with Plavix and start a daily Plavix therapy.  In addition they have recommended MRI brain.  The patient is being admitted for TIA work-up.

## 2022-02-09 NOTE — Assessment & Plan Note (Signed)
--   this is monitored by his cardiologist, Dr. Marisue Humble -- pt has bicuspid aortic valve

## 2022-02-09 NOTE — Assessment & Plan Note (Signed)
--   will offer nightly CPAP in hospital

## 2022-02-09 NOTE — ED Notes (Signed)
Patient transported to CT 
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
## 2022-02-09 NOTE — Assessment & Plan Note (Signed)
--   continue home medications and follow BP

## 2022-02-09 NOTE — ED Notes (Signed)
Informed Dr. Reather Converse about pt episode of facial numbness this am, per Dr. He will evaluated him shortly.

## 2022-02-09 NOTE — ED Provider Notes (Signed)
Patient seen with wife in triage.  Reports sudden onset of left arm numbness/weakness, left leg weakness, and slurred speech that started this morning while at work at 9:30 AM.  Reports symptoms lasted about 5 minutes, then improved all the way.  Denies any memory issues, vision changes, headache, dizziness or lightheadedness.  Medical history significant for type 2 diabetes, thoracic aortic aneurysm, hyperlipidemia, family history of coronary artery disease.  Discussed with patient and wife that it is possible he may have had a transient ischemic attack and recommended further evaluation and management in the emergency room for further work-up.  Patient and wife are in agreement to this plan.  Patient safe to transport by private vehicle.   Eulogio Bear, NP 02/09/22 1216

## 2022-02-09 NOTE — ED Notes (Signed)
Patient is being discharged from the Urgent Care and sent to the Emergency Department via POV . Per NP, patient is in need of higher level of care due to left sided extremity numbness/TIA. Patient is aware and verbalizes understanding of plan of care.  Vitals:   02/09/22 1205  BP: 108/74  Pulse: 84  Resp: 18  Temp: 98.4 F (36.9 C)  SpO2: 96%

## 2022-02-09 NOTE — Assessment & Plan Note (Signed)
--   uncontrolled as evidenced by an A1c >7%  --12/30/21 A1C--7.9 -- he has diabetic retinopathy and history of retinal complications

## 2022-02-09 NOTE — Assessment & Plan Note (Addendum)
--   resume home rosuvastatin 5 mg daily  -- follow up morning fasting lipid panel -- work on better glycemic control which will improve the lipid profile

## 2022-02-09 NOTE — ED Notes (Signed)
Attempted to call [facility] ED Charge RN for report. No answer. NP aware.

## 2022-02-09 NOTE — ED Triage Notes (Addendum)
Pt was at work and approx  0930 and experienced left facial numbness, slurred speech and bilateral arm and leg heaviness lasting approximately 5 minutes.  LKW 9am this morning.  History of diabetes.

## 2022-02-09 NOTE — Assessment & Plan Note (Addendum)
--   he has been monitored for this by his cardiologist  -- continue outpt surveillance

## 2022-02-09 NOTE — Discharge Instructions (Addendum)
Please go directly to the emergency room for further evaluation and work-up of the weakness and slurred speech

## 2022-02-10 ENCOUNTER — Observation Stay (HOSPITAL_COMMUNITY): Payer: Medicare HMO

## 2022-02-10 ENCOUNTER — Observation Stay (HOSPITAL_BASED_OUTPATIENT_CLINIC_OR_DEPARTMENT_OTHER): Payer: Medicare HMO

## 2022-02-10 DIAGNOSIS — G459 Transient cerebral ischemic attack, unspecified: Secondary | ICD-10-CM | POA: Diagnosis not present

## 2022-02-10 DIAGNOSIS — E1165 Type 2 diabetes mellitus with hyperglycemia: Secondary | ICD-10-CM

## 2022-02-10 DIAGNOSIS — R531 Weakness: Secondary | ICD-10-CM | POA: Diagnosis not present

## 2022-02-10 DIAGNOSIS — I639 Cerebral infarction, unspecified: Secondary | ICD-10-CM | POA: Diagnosis not present

## 2022-02-10 DIAGNOSIS — I10 Essential (primary) hypertension: Secondary | ICD-10-CM | POA: Diagnosis not present

## 2022-02-10 LAB — ECHOCARDIOGRAM COMPLETE
AR max vel: 1.27 cm2
AV Area VTI: 1.57 cm2
AV Area mean vel: 1.22 cm2
AV Mean grad: 15 mmHg
AV Peak grad: 24 mmHg
Ao pk vel: 2.45 m/s
Area-P 1/2: 3.74 cm2
Calc EF: 60 %
Height: 72 in
MV VTI: 4.19 cm2
S' Lateral: 3.9 cm
Single Plane A2C EF: 59.2 %
Single Plane A4C EF: 60.2 %
Weight: 3414.48 oz

## 2022-02-10 LAB — GLUCOSE, CAPILLARY
Glucose-Capillary: 123 mg/dL — ABNORMAL HIGH (ref 70–99)
Glucose-Capillary: 144 mg/dL — ABNORMAL HIGH (ref 70–99)
Glucose-Capillary: 144 mg/dL — ABNORMAL HIGH (ref 70–99)

## 2022-02-10 LAB — LIPID PANEL
Cholesterol: 101 mg/dL (ref 0–200)
HDL: 28 mg/dL — ABNORMAL LOW (ref >40)
LDL Cholesterol: 41 mg/dL (ref 0–99)
Total CHOL/HDL Ratio: 3.6 RATIO
Triglycerides: 158 mg/dL — ABNORMAL HIGH (ref <150)
VLDL: 32 mg/dL (ref 0–40)

## 2022-02-10 LAB — BASIC METABOLIC PANEL
Anion gap: 6 (ref 5–15)
BUN: 21 mg/dL (ref 8–23)
CO2: 24 mmol/L (ref 22–32)
Calcium: 8.8 mg/dL — ABNORMAL LOW (ref 8.9–10.3)
Chloride: 108 mmol/L (ref 98–111)
Creatinine, Ser: 1.35 mg/dL — ABNORMAL HIGH (ref 0.61–1.24)
GFR, Estimated: 58 mL/min — ABNORMAL LOW (ref >60)
Glucose, Bld: 125 mg/dL — ABNORMAL HIGH (ref 70–99)
Potassium: 4.3 mmol/L (ref 3.5–5.1)
Sodium: 138 mmol/L (ref 135–145)

## 2022-02-10 MED ORDER — ASPIRIN 81 MG PO TBEC: 81.0000 mg | DELAYED_RELEASE_TABLET | Freq: Every day | ORAL | 12 refills | Status: DC

## 2022-02-10 MED ORDER — CLOPIDOGREL BISULFATE 75 MG PO TABS: 75.0000 mg | ORAL_TABLET | Freq: Every day | ORAL | 0 refills | Status: DC

## 2022-02-10 MED ORDER — ASPIRIN 81 MG PO TBEC
81.0000 mg | DELAYED_RELEASE_TABLET | Freq: Every day | ORAL | Status: DC
Start: 2022-02-10 — End: 2022-02-10

## 2022-02-10 NOTE — Progress Notes (Signed)
Patient is a & o x 4 and  slept quietly throughout the night with no c/o pain or concerns voiced. There were no neurological deficits assessed this shift and was able to follow commands without difficulty.

## 2022-02-10 NOTE — Discharge Summary (Addendum)
Physician Discharge Summary   Patient: Timothy Bernard MRN: [id number] DOB: 05-10-1956  Admit date:     02/09/2022  Discharge date: 02/10/22  Discharge Physician: Shanon Brow Jameson Morrow   PCP: Venia Carbon, MD   Recommendations at discharge:   Please follow up with primary care provider within 1-2 weeks  Please repeat BMP and CBC in one week     Hospital Course: 66 year old gentleman with type 2 diabetes mellitus uncontrolled with diabetic retinopathy, hyperlipidemia, hypertension, aortic valve disease, OSA, ascending aortic aneurysm presented to the [facility] urgent care with complaints of sudden onset left-sided facial numbness upper and lower extremity numbness that started at 930 this morning when he was at work.  The symptoms lasted about 5 minutes.  He tested his blood glucose and it was 80.  He had reportedly slightly slurred speech at the time.  This has resolved as well.  He had no difficulty walking and no fall.  He denies dizziness headache and vision changes.  The urgent care provider recommended he go to the emergency department over concern for TIA symptoms.  He was sent for CT with no acute findings found.  Neurology was contacted and they have recommended that he have a full stroke work-up and be loaded with Plavix and start a daily Plavix therapy.  In addition they have recommended MRI brain.  The patient is being admitted for TIA work-up.  Assessment and Plan: * TIA (transient ischemic attack) -- appreciate neurologist input Dr. Curly Shores and Hortense Ramal -- pt loaded with plavix 300 mg x 1  -- continue plavix 75 mg daily and ASA 81 mg x 21 days, then ASA 81 alone -- full TIA work up ordered.  -- follow up MRI brain--neg for acute infarct --MRA brain--no significant intracranial stenosis -- carotid dopplers --no hemodynamically significant stenosis -- follow up lipid panel--LDL 41 -- A1c 7.9%  -- PT/OT evaluation--no follow up --appreciate Dr. 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 Marisue Humble -- pt has bicuspid aortic valve  Essential hypertension -- continue home medications and follow BP  Obstructive sleep apnea -- offered nightly CPAP in hospital   Hyperlipidemia associated with type 2 diabetes mellitus ([place]) -- resume home rosuvastatin 5 mg daily  -- LDL 41         Consultants: neurology Procedures performed: none  Disposition: Home Diet recommendation:  Cardiac and Carb modified diet DISCHARGE MEDICATION: Allergies as of 02/10/2022   No Known Allergies      Medication List     TAKE these medications    aspirin EC 81 MG tablet Take 1 tablet (81 mg total) by mouth daily. Swallow whole.   clopidogrel 75 MG tablet Commonly known as: PLAVIX Take 1 tablet (75 mg total) by mouth daily. Start taking on: February 11, 2022   dapagliflozin propanediol 5 MG Tabs tablet Commonly  known as: FARXIGA Take 5 mg by mouth daily.   Flax Seed Oil 1000 MG Caps Take 1 capsule by mouth daily.   lisinopril 10 MG tablet Commonly known as: ZESTRIL TAKE 1 TABLET TWICE A DAY What changed:  how much to take when to take this additional instructions   metFORMIN 500 MG 24 hr tablet Commonly known as: GLUCOPHAGE-XR TAKE 4 TABLETS BY MOUTH DAILY WITH BREAKFAST. What changed: See the new instructions.   metoprolol  succinate 50 MG 24 hr tablet Commonly known as: TOPROL-XL TAKE 1 TABLET DAILY WITH OR IMMEDIATELY FOLLOWING A MEAL What changed:  how much to take how to take this when to take this additional instructions   OneTouch Ultra test strip Generic drug: glucose blood USE TO TEST BLOOD SUGAR ONCE DAILY E11.65   True Metrix Blood Glucose Test test strip Generic drug: glucose blood Use to check blood sugar once daily.   onetouch ultrasoft lancets 1 each by Other route daily. Dx:250.00   rosuvastatin 5 MG tablet Commonly known as: CRESTOR Take 1 tablet (5 mg total) by mouth daily.   Rybelsus 3 MG Tabs Generic drug: Semaglutide Take 3 mg by mouth daily.        Discharge Exam: Filed Weights   02/09/22 1227  Weight: 96.8 kg   HEENT:  [place]/AT, No thrush, no icterus CV:  RRR, no rub, no S3, no S4 Lung:  CTA, no wheeze, no rhonchi Abd:  soft/+BS, NT Ext:  No edema, no lymphangitis, no synovitis, no rash   Condition at discharge: stable  The results of significant diagnostics from this hospitalization (including imaging, microbiology, ancillary and laboratory) are listed below for reference.   Imaging Studies: MR ANGIO HEAD WO CONTRAST  Result Date: 02/10/2022 CLINICAL DATA:  Stroke, follow-up, left-sided numbness EXAM: MRA HEAD WITHOUT CONTRAST TECHNIQUE: Angiographic images of the Circle of Willis were acquired using MRA technique without intravenous contrast. COMPARISON:  None Available. FINDINGS: Anterior circulation: Intracranial internal carotid arteries are patent. Anterior and middle cerebral arteries are patent. Posterior circulation: Intracranial vertebral arteries are patent. Left vertebral be comes diminutive after PICA origin. Basilar artery is patent. Posterior cerebral arteries are patent. Right posterior communicating artery is present. IMPRESSION: No proximal intracranial vessel occlusion or significant stenosis. Electronically Signed   By: Macy Mis M.D.   On:  02/10/2022 12:18   MR BRAIN WO CONTRAST  Result Date: 02/09/2022 CLINICAL DATA:  Transient ischemic attack (TIA) left sided sxs, slurred speech, TIA EXAM: MRI HEAD WITHOUT CONTRAST TECHNIQUE: Multiplanar, multiecho pulse sequences of the brain and surrounding structures were obtained without intravenous contrast. COMPARISON:  CT head from the same day. FINDINGS: Brain: No acute infarction, hemorrhage, hydrocephalus, extra-axial collection or mass lesion. 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 Vascular: Major arterial flow voids are maintained at the skull base. Skull and upper cervical spine: Normal marrow signal. Sinuses/Orbits: Clear sinuses.  No acute orbital findings. Other: Trace bilateral mastoid effusions. IMPRESSION: 1. No evidence of acute intracranial abnormality. 2. Remote lacunar infarcts. Electronically Signed   By: Margaretha Sheffield M.D.   On: 02/09/2022 16:22   US Carotid Bilateral (at [facility] and [facility] only)  Result Date: 02/09/2022 CLINICAL DATA:  Hypertension, TIA symptoms, hyperlipidemia and diabetes EXAM: BILATERAL CAROTID DUPLEX ULTRASOUND TECHNIQUE: Pearline Cables scale imaging, color Doppler and duplex ultrasound were performed of bilateral carotid and vertebral arteries in the neck. COMPARISON:  None Available. FINDINGS: Criteria: Quantification of carotid stenosis is based on velocity parameters that correlate the residual  internal carotid diameter with NASCET-based stenosis levels, using the diameter of the distal internal carotid lumen as the denominator for stenosis measurement. The following velocity measurements were obtained: RIGHT ICA: 64/23 cm/sec CCA: 40/98 cm/sec SYSTOLIC ICA/CCA RATIO:  1.0 ECA: 73 cm/sec LEFT ICA: 79/32 cm/sec CCA: 11/91 cm/sec SYSTOLIC ICA/CCA RATIO:  1.2 ECA: 70 cm/sec RIGHT CAROTID ARTERY: Intimal thickening and trace bifurcation hypoechoic atherosclerosis. Negative for stenosis, velocity elevation, or turbulent flow. Degree of narrowing less  than 50% by ultrasound criteria. RIGHT VERTEBRAL ARTERY:  Normal antegrade flow LEFT CAROTID ARTERY: Similar intimal thickening and minor bifurcation atherosclerosis. Negative for stenosis, velocity elevation, or turbulent flow. Degree of narrowing also less than 50% by ultrasound criteria. LEFT VERTEBRAL ARTERY:  Normal antegrade flow IMPRESSION: Intimal thickening and minor bilateral carotid atherosclerosis. Negative for significant stenosis. Degree of narrowing less than 50% bilaterally by ultrasound criteria. Patent antegrade vertebral flow bilaterally Electronically Signed   By: Jerilynn Mages.  Shick M.D.   On: 02/09/2022 15:24   CT HEAD WO CONTRAST  Result Date: 02/09/2022 CLINICAL DATA:  Neuro deficit, acute, stroke suspected, slurred speech; left-sided facial numbness EXAM: CT HEAD WITHOUT CONTRAST TECHNIQUE: Contiguous axial images were obtained from the base of the skull through the vertex without intravenous contrast. RADIATION DOSE REDUCTION: This exam was performed according to the departmental dose-optimization program which includes automated exposure control, adjustment of the mA and/or kV according to patient size and/or use of iterative reconstruction technique. COMPARISON:  None Available. FINDINGS: Brain: There is no acute intracranial hemorrhage, mass effect, or edema. Gray-white differentiation is preserved. There is no extra-axial fluid collection. Prominence of the ventricles and sulci reflects minor parenchymal volume loss. Patchy hypoattenuation in the supratentorial white matter is nonspecific but may reflect mild chronic microvascular ischemic changes. There is an age-indeterminate small vessel infarct of left gangliocapsular region. Vascular: There is atherosclerotic calcification at the skull base. Skull: Calvarium is unremarkable. Sinuses/Orbits: No acute finding. Other: None. IMPRESSION: No acute intracranial hemorrhage. Age-indeterminate small vessel infarct of the left gangliocapsular  region. Mild chronic microvascular ischemic changes. Electronically Signed   By: Macy Mis M.D.   On: 02/09/2022 13:34    Microbiology: No results found for this or any previous visit.  Labs: CBC: Recent Labs  Lab 02/09/22 1256  WBC 9.9  HGB 15.5  HCT 46.8  MCV 93.0  PLT 478   Basic Metabolic Panel: Recent Labs  Lab 02/09/22 1256 02/10/22 0411  NA 139 138  K 4.1 4.3  CL 108 108  CO2 24 24  GLUCOSE 131* 125*  BUN 20 21  CREATININE 1.29* 1.35*  CALCIUM 9.1 8.8*   Liver Function Tests: Recent Labs  Lab 02/09/22 1256  AST 19  ALT 20  ALKPHOS 63  BILITOT 0.6  PROT 7.7  ALBUMIN 4.1   CBG: Recent Labs  Lab 02/09/22 1622 02/09/22 2137 02/10/22 0257 02/10/22 0731 02/10/22 1120  GLUCAP 110* 140* 123* 144* 144*    Discharge time spent: greater than 30 minutes.  Signed: Orson Eva, MD [facility] 02/10/2022

## 2022-02-10 NOTE — Progress Notes (Signed)
*  PRELIMINARY RESULTS* Echocardiogram 2D Echocardiogram has been performed.  Timothy Bernard 02/10/2022, 11:33 AM

## 2022-02-10 NOTE — Care Management Obs Status (Signed)
[facility] NOTIFICATION   Patient Details  Name: Timothy Bernard MRN: [id number] Date of Birth: 1955-09-28   Medicare Observation Status Notification Given:  Yes    Tommy Medal 02/10/2022, 1:28 PM

## 2022-02-10 NOTE — Evaluation (Signed)
Occupational Therapy Evaluation Patient Details Name: Timothy Bernard MRN: [id number] DOB: 22-May-1956 Today's Date: 02/10/2022   History of Present Illness Timothy Bernard is a 66 year old gentleman with type 2 diabetes mellitus uncontrolled with diabetic retinopathy, hyperlipidemia, hypertension, aortic valve disease, OSA, ascending aortic aneurysm presented to the [facility] urgent care with complaints of sudden onset left-sided facial numbness upper and lower extremity numbness that started at 930 this morning when he was at work.  The symptoms lasted about 5 minutes.  He tested his blood glucose and it was 80.  He had reportedly slightly slurred speech at the time.  This has resolved as well.  He had no difficulty walking and no fall.  He denies dizziness headache and vision changes.  The urgent care provider recommended he go to the emergency department over concern for TIA symptoms.  He was sent for CT with no acute findings found.  Neurology was contacted and they have recommended that he have a full stroke work-up and be loaded with Plavix and start a daily Plavix therapy.  In addition they have recommended MRI brain.  The patient is being admitted for TIA work-up.   Clinical Impression   Pt agreeable to OT and PT co-evaluation. Pt appears to be at or near baseline levels. No assist needed for bed mobility or transfers. Pt demonstrates good B UE strength without any further report of numbness or tingling. Pt is not recommended for further acute OT services and will be discharged to care of nursing staff for remaining length of stay.      Recommendations for follow up therapy are one component of a multi-disciplinary discharge planning process, led by the attending physician.  Recommendations may be updated based on patient status, additional functional criteria and insurance authorization.   Follow Up Recommendations  No OT follow up    Assistance Recommended at Discharge None         Functional Status Assessment  Patient has not had a recent decline in their functional status  Equipment Recommendations  None recommended by OT    Recommendations for Other Services       Precautions / Restrictions Precautions Precautions: None Restrictions Weight Bearing Restrictions: No      Mobility Bed Mobility Overal bed mobility: Independent                  Transfers Overall transfer level: Independent                 General transfer comment: Ambulating in room and hall without AD      Balance Overall balance assessment: Independent                                         ADL either performed or assessed with clinical judgement   ADL Overall ADL's : Independent                                             Vision Baseline Vision/History:  (R detached retina; previous cataracts surgery.) Ability to See in Adequate Light: 1 Impaired Patient Visual Report: No change from baseline Vision Assessment?: No apparent visual deficits                Pertinent Vitals/Pain Pain Assessment Pain Assessment: No/denies pain  Hand Dominance Right   Extremity/Trunk Assessment Upper Extremity Assessment Upper Extremity Assessment: Overall WFL for tasks assessed   Lower Extremity Assessment Lower Extremity Assessment: Defer to PT evaluation   Cervical / Trunk Assessment Cervical / Trunk Assessment: Normal   Communication Communication Communication: No difficulties   Cognition Arousal/Alertness: Awake/alert Behavior During Therapy: WFL for tasks assessed/performed Overall Cognitive Status: Within Functional Limits for tasks assessed                                                        Home Living Family/patient expects to be discharged to:: Private residence Living Arrangements: Spouse/significant other Available Help at Discharge: Family;Available 24 hours/day Type of Home:  House Home Access: Stairs to enter [employer] of Steps: 3 Entrance Stairs-Rails: Right (going up) Home Layout: Two level Alternate Level Stairs-Number of Steps: 13 Alternate Level Stairs-Rails: Left (going up) Bathroom Shower/Tub: Tub/shower unit;Walk-in shower   Bathroom Toilet: Handicapped height Bathroom Accessibility: Yes   Home Equipment: None          Prior Functioning/Environment Prior Level of Function : Independent/Modified Independent             Mobility Comments: Hydrographic surveyor without AD ADLs Comments: Independent                      OT Goals(Current goals can be found in the care plan section) Acute Rehab OT Goals Patient Stated Goal: return home  OT Frequency:      Co-evaluation PT/OT/SLP Co-Evaluation/Treatment: Yes Reason for Co-Treatment: To address functional/ADL transfers   OT goals addressed during session: ADL's and self-care      AM-PAC OT "6 Clicks" Daily Activity     Outcome Measure Help from another person eating meals?: None Help from another person taking care of personal grooming?: None Help from another person toileting, which includes using toliet, bedpan, or urinal?: None Help from another person bathing (including washing, rinsing, drying)?: None Help from another person to put on and taking off regular upper body clothing?: None Help from another person to put on and taking off regular lower body clothing?: None 6 Click Score: 24   End of Session    Activity Tolerance: Patient tolerated treatment well Patient left: in bed;with call bell/phone within reach;with family/visitor present  OT Visit Diagnosis: Other symptoms and signs involving the nervous system (U13.244)                Time: 0102-7253 OT Time Calculation (min): 12 min Charges:  OT General Charges $OT Visit: 1 Visit OT Evaluation $OT Eval Low Complexity: 1 Low  Timothy Bernard OT, MOT  Larey Seat 02/10/2022, 8:44  AM

## 2022-02-10 NOTE — Progress Notes (Signed)
Nsg Discharge Note  Admit Date:  02/09/2022 Discharge date: 02/10/2022   Adrian Prince to be D/C'd Home per MD order.  AVS completed.  Copy for chart, and copy for patient signed, and dated. Patient/caregiver able to verbalize understanding.  Discharge Medication: Allergies as of 02/10/2022   No Known Allergies      Medication List     TAKE these medications    aspirin EC 81 MG tablet Take 1 tablet (81 mg total) by mouth daily. Swallow whole.   clopidogrel 75 MG tablet Commonly known as: PLAVIX Take 1 tablet (75 mg total) by mouth daily. Start taking on: February 11, 2022   dapagliflozin propanediol 5 MG Tabs tablet Commonly known as: FARXIGA Take 5 mg by mouth daily.   Flax Seed Oil 1000 MG Caps Take 1 capsule by mouth daily.   lisinopril 10 MG tablet Commonly known as: ZESTRIL TAKE 1 TABLET TWICE A DAY What changed:  how much to take when to take this additional instructions   metFORMIN 500 MG 24 hr tablet Commonly known as: GLUCOPHAGE-XR TAKE 4 TABLETS BY MOUTH DAILY WITH BREAKFAST. What changed: See the new instructions.   metoprolol succinate 50 MG 24 hr tablet Commonly known as: TOPROL-XL TAKE 1 TABLET DAILY WITH OR IMMEDIATELY FOLLOWING A MEAL What changed:  how much to take how to take this when to take this additional instructions   OneTouch Ultra test strip Generic drug: glucose blood USE TO TEST BLOOD SUGAR ONCE DAILY E11.65   True Metrix Blood Glucose Test test strip Generic drug: glucose blood Use to check blood sugar once daily.   onetouch ultrasoft lancets 1 each by Other route daily. Dx:250.00   rosuvastatin 5 MG tablet Commonly known as: CRESTOR Take 1 tablet (5 mg total) by mouth daily.   Rybelsus 3 MG Tabs Generic drug: Semaglutide Take 3 mg by mouth daily.        Discharge Assessment: Vitals:   02/10/22 0847 02/10/22 1312  BP: 126/79 133/78  Pulse: 72 65  Resp:  16  Temp:  98.2 F (36.8 C)  SpO2:  99%   Skin clean,  dry and intact without evidence of skin break down, no evidence of skin tears noted. IV catheter discontinued intact. Site without signs and symptoms of complications - no redness or edema noted at insertion site, patient denies c/o pain - only slight tenderness at site.  Dressing with slight pressure applied.  D/c Instructions-Education: Discharge instructions given to patient/family with verbalized understanding. D/c education completed with patient/family including follow up instructions, medication list, d/c activities limitations if indicated, with other d/c instructions as indicated by MD - patient able to verbalize understanding, all questions fully answered. Patient instructed to return to ED, call 911, or call MD for any changes in condition.  Patient escorted via [place], and D/C home via private auto.  Dorcas Mcmurray, RN 02/10/2022 2:59 PM

## 2022-02-10 NOTE — TOC Progression Note (Signed)
Transition of Care [facility]) - Progression Note    Patient Details  Name: HERMANN DOTTAVIO MRN: [id number] Date of Birth: 09/28/1955  Transition of Care [facility]) CM/SW Contact  Salome Arnt, [place] Phone Number: 02/10/2022, 1:23 PM  Clinical Narrative:  Transition of Care [facility]) Screening Note   Patient Details  Name: DONJUAN ROBISON Date of Birth: 12/06/1955   Transition of Care [facility]) CM/SW Contact:    Salome Arnt, LCSW Phone Number: 02/10/2022, 1:23 PM    Transition of Care Department [facility]) has reviewed patient and no TOC needs have been identified at this time. We will continue to monitor patient advancement through interdisciplinary progression rounds. If new patient transition needs arise, please place a TOC consult.          Barriers to Discharge: Barriers Resolved  Expected Discharge Plan and Services           Expected Discharge Date: 02/10/22                                     Social Determinants of Health (SDOH) Interventions    Readmission Risk Interventions     No data to display

## 2022-02-10 NOTE — Evaluation (Signed)
Physical Therapy Evaluation Patient Details Name: Timothy Bernard MRN: [id number] DOB: 1956/02/10 Today's Date: 02/10/2022  History of Present Illness  Timothy Bernard is a 66 year old gentleman with type 2 diabetes mellitus uncontrolled with diabetic retinopathy, hyperlipidemia, hypertension, aortic valve disease, OSA, ascending aortic aneurysm presented to the [place] urgent care with complaints of sudden onset left-sided facial numbness upper and lower extremity numbness that started at 930 this morning when he was at work.  The symptoms lasted about 5 minutes.  He tested his blood glucose and it was 80.  He had reportedly slightly slurred speech at the time.  This has resolved as well.  He had no difficulty walking and no fall.  He denies dizziness headache and vision changes.  The urgent care provider recommended he go to the emergency department over concern for TIA symptoms.  He was sent for CT with no acute findings found.  Neurology was contacted and they have recommended that he have a full stroke work-up and be loaded with Plavix and start a daily Plavix therapy.  In addition they have recommended MRI brain.  The patient is being admitted for TIA work-up.   Clinical Impression  Patient presents sitting at bedside with consent to PT/OT co-evaluation. Patient is independent with bed mobility and transfers demonstrating appropriate strength and coordination during assessment. No AD needed with transfers. Patient able to ambulate 200 feet independently with minor gait deviations but gait pattern is overall WNL. Slight decrease in gait speed but demonstrates appropriate balance and coordination. No significant findings from neurological assessment. Patient functioning near/at baseline for functional mobility and gait  Patient tolerated sitting up in chair after therapy. Patient discharged to care of nursing for ambulation daily as tolerated for length of stay.      Recommendations for follow up  therapy are one component of a multi-disciplinary discharge planning process, led by the attending physician.  Recommendations may be updated based on patient status, additional functional criteria and insurance authorization.  Follow Up Recommendations No PT follow up    Assistance Recommended at Discharge PRN  Patient can return home with the following  A little help with walking and/or transfers;Help with stairs or ramp for entrance    Equipment Recommendations None recommended by PT  Recommendations for Other Services       Functional Status Assessment Patient has not had a recent decline in their functional status     Precautions / Restrictions Precautions Precautions: None Restrictions Weight Bearing Restrictions: No      Mobility  Bed Mobility Overal bed mobility: Independent             General bed mobility comments: Able to perform supine to sit independently with appropriate strength and coordination    Transfers Overall transfer level: Independent                 General transfer comment: Able to perform sit to stand and bed to chair independently without AD    Ambulation/Gait Ambulation/Gait assistance: Independent Gait Distance (Feet): 200 Feet   Gait Pattern/deviations: Step-through pattern, Decreased stride length, WFL(Within Functional Limits) Gait velocity: slightly decreased     General Gait Details: Able to ambulate 200 feet independently with no AD. Minor gait deviations but gait pattern WNL overall. Slight decrease in gait speed but appropriate coordination and balance observed  Stairs            Wheelchair Mobility    Modified Rankin (Stroke Patients Only)  Balance Overall balance assessment: Independent                                           Pertinent Vitals/Pain Pain Assessment Pain Assessment: No/denies pain    Home Living Family/patient expects to be discharged to:: Private  residence Living Arrangements: Spouse/significant other Available Help at Discharge: Family;Available 24 hours/day Type of Home: House Home Access: Stairs to enter Entrance Stairs-Rails: Right Entrance Stairs-Number of Steps: 3 Alternate Level Stairs-Number of Steps: 13 Home Layout: Two level Home Equipment: None      Prior Function Prior Level of Function : Independent/Modified Independent             Mobility Comments: Hydrographic surveyor without AD ADLs Comments: Independent     Hand Dominance   Dominant Hand: Right    Extremity/Trunk Assessment   Upper Extremity Assessment Upper Extremity Assessment: Defer to OT evaluation    Lower Extremity Assessment Lower Extremity Assessment: Overall WFL for tasks assessed    Cervical / Trunk Assessment Cervical / Trunk Assessment: Normal  Communication   Communication: No difficulties  Cognition Arousal/Alertness: Awake/alert Behavior During Therapy: WFL for tasks assessed/performed Overall Cognitive Status: Within Functional Limits for tasks assessed                                          General Comments      Exercises     Assessment/Plan    PT Assessment Patient does not need any further PT services  PT Problem List         PT Treatment Interventions      PT Goals (Current goals can be found in the Care Plan section)  Acute Rehab PT Goals Patient Stated Goal: return home PT Goal Formulation: With patient/family Time For Goal Achievement: 02/17/22 Potential to Achieve Goals: Good    Frequency       Co-evaluation   Reason for Co-Treatment: To address functional/ADL transfers   OT goals addressed during session: ADL's and self-care       AM-PAC PT "6 Clicks" Mobility  Outcome Measure Help needed turning from your back to your side while in a flat bed without using bedrails?: None Help needed moving from lying on your back to sitting on the side of a flat bed without using  bedrails?: None Help needed moving to and from a bed to a chair (including a wheelchair)?: None Help needed standing up from a chair using your arms (e.g., wheelchair or bedside chair)?: None Help needed to walk in hospital room?: None Help needed climbing 3-5 steps with a railing? : None 6 Click Score: 24    End of Session   Activity Tolerance: Patient tolerated treatment well Patient left: in chair;with family/visitor present Nurse Communication: Mobility status PT Visit Diagnosis: Unsteadiness on feet (R26.81);Other abnormalities of gait and mobility (R26.89);Muscle weakness (generalized) (M62.81)    Time: 7412-8786 PT Time Calculation (min) (ACUTE ONLY): 20 min   Charges:   PT Evaluation $PT Eval Moderate Complexity: 1 Mod PT Treatments $Therapeutic Activity: 8-22 mins        11:31 AM, 02/10/22 Lestine Box, S/PT
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
## 2022-02-10 NOTE — Progress Notes (Signed)
SLP Cancellation Note  Patient Details Name: Timothy Bernard MRN: [id number] DOB: Jun 13, 1956   Cancelled treatment:       Reason Eval/Treat Not Completed: SLP screened, no needs identified, will sign off; SLP screened via chart review, RN input, and spoke with Pt's wife who denies any changes in swallowing, speech, language, or cognition. MRI negative for acute changes. Pt is off the floor for testing. SLE will be deferred at this time. Reconsult if indicated. SLP will sign off.  Thank you,  Genene Churn, [place]    [place] 02/10/2022, 12:32 PM

## 2022-02-11 ENCOUNTER — Telehealth: Payer: Self-pay | Admitting: *Deleted

## 2022-02-11 DIAGNOSIS — G459 Transient cerebral ischemic attack, unspecified: Secondary | ICD-10-CM

## 2022-02-11 NOTE — Telephone Encounter (Signed)
Received staff msg from Dr. Carles Collet requesting 30 day event monitor for stroke.

## 2022-02-12 ENCOUNTER — Other Ambulatory Visit: Payer: Self-pay

## 2022-02-12 DIAGNOSIS — E119 Type 2 diabetes mellitus without complications: Secondary | ICD-10-CM

## 2022-02-26 ENCOUNTER — Ambulatory Visit (INDEPENDENT_AMBULATORY_CARE_PROVIDER_SITE_OTHER): Payer: Medicare HMO | Admitting: Internal Medicine

## 2022-02-26 ENCOUNTER — Encounter: Payer: Self-pay | Admitting: Internal Medicine

## 2022-02-26 DIAGNOSIS — E1169 Type 2 diabetes mellitus with other specified complication: Secondary | ICD-10-CM | POA: Diagnosis not present

## 2022-02-26 DIAGNOSIS — E785 Hyperlipidemia, unspecified: Secondary | ICD-10-CM

## 2022-02-26 DIAGNOSIS — I10 Essential (primary) hypertension: Secondary | ICD-10-CM

## 2022-02-26 DIAGNOSIS — G459 Transient cerebral ischemic attack, unspecified: Secondary | ICD-10-CM | POA: Diagnosis not present
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
## 2022-02-26 NOTE — Assessment & Plan Note (Addendum)
Now starting rybelsus for diabetes Discussed change to '7mg'$  dose after 1 month at '3mg'$  Metformin 1000 bid

## 2022-02-26 NOTE — Assessment & Plan Note (Signed)
MRI/MRA negative No sig change in echo---but having chest MRA to look at aorta plavix ends soon---then back to ASA 81 crestor '5mg'$  daily

## 2022-02-26 NOTE — Assessment & Plan Note (Signed)
BP Readings from Last 3 Encounters:  02/26/22 122/80  02/10/22 133/78  02/09/22 108/74   Good control on lisinopril 10 and metoprolol 50 daily

## 2022-02-27 DIAGNOSIS — I480 Paroxysmal atrial fibrillation: Secondary | ICD-10-CM

## 2022-02-27 HISTORY — DX: Paroxysmal atrial fibrillation: I48.0

## 2022-02-28 DIAGNOSIS — G459 Transient cerebral ischemic attack, unspecified: Secondary | ICD-10-CM

## 2022-02-28 DIAGNOSIS — G464 Cerebellar stroke syndrome: Secondary | ICD-10-CM | POA: Diagnosis not present

## 2022-03-04 ENCOUNTER — Ambulatory Visit (INDEPENDENT_AMBULATORY_CARE_PROVIDER_SITE_OTHER): Payer: Medicare HMO

## 2022-03-08 ENCOUNTER — Other Ambulatory Visit: Payer: Self-pay

## 2022-03-08 DIAGNOSIS — E119 Type 2 diabetes mellitus without complications: Secondary | ICD-10-CM

## 2022-03-14 ENCOUNTER — Encounter: Payer: Self-pay | Admitting: Cardiovascular Disease

## 2022-03-14 ENCOUNTER — Telehealth: Payer: Self-pay | Admitting: Internal Medicine

## 2022-03-14 NOTE — Telephone Encounter (Signed)
Received a message from [employer]. Called back and was notified that the patient had one episode of asymptomatic atrial fibrillation ~12:45am 7/16. [facility] will notify the ordering provider and the patient.

## 2022-03-15 NOTE — Telephone Encounter (Signed)
Please let the patient know that his event monitor reportedly showed an episode of a-fib.  Please have the patient see me or an APP at his earliest convenience to discuss this further.  If possible, tracings should be available for review in order to confirm presence of a-fib.    Nelva Bush, MD  [facility]
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
## 2022-03-15 NOTE — Telephone Encounter (Signed)
[employer] Tracing obtained with assistance of our [employer].   Tracing in question advised A-fib w/ RVR occurring on 03/13/22 ~ 10:33 CST. HR- 160-190 bpm.  Reviewed tracing with Christell Faith, PA. A-fib confirmed.  I called and spoke with the patient's wife (ok per DPR & primary contact # is hers). I have advised her of the monitor tracing. I inquired if she was with the patient on Saturday evening/ Sunday early AM and if the patient was complaining of any heart racing. Per Timothy Bernard, the patient was without complaints at the time of the visit.  She does confirm he had a TIA on 02/09/22.  I have reviewed with her that stroke risk is increased with atrial fibrillation, so his TIA symptoms may have been due to his heart being out of rhythm and he was just now aware of the arrhythmia.   I have offered an in office evaluation for tomorrow morning at 8:50 am with Christell Faith, PA.   Per Timothy Bernard, she is agreeable with the appointment and will notify the patient.

## 2022-03-16 ENCOUNTER — Other Ambulatory Visit
Admission: RE | Admit: 2022-03-16 | Discharge: 2022-03-16 | Disposition: A | Discharge status: Home / Self Care | Payer: Medicare HMO | Source: Ambulatory Visit | Attending: Physician Assistant | Admitting: Physician Assistant

## 2022-03-16 ENCOUNTER — Telehealth: Payer: Self-pay | Admitting: Physician Assistant

## 2022-03-16 ENCOUNTER — Ambulatory Visit: Payer: Medicare HMO | Admitting: Physician Assistant

## 2022-03-16 ENCOUNTER — Other Ambulatory Visit: Payer: Self-pay | Admitting: Endocrinology

## 2022-03-16 ENCOUNTER — Encounter: Payer: Self-pay | Admitting: Physician Assistant

## 2022-03-16 VITALS — BP 100/80 | HR 77 | Ht 72.0 in | Wt 213.1 lb

## 2022-03-16 DIAGNOSIS — Q231 Congenital insufficiency of aortic valve: Secondary | ICD-10-CM | POA: Insufficient documentation

## 2022-03-16 DIAGNOSIS — I7121 Aneurysm of the ascending aorta, without rupture: Secondary | ICD-10-CM

## 2022-03-16 DIAGNOSIS — E785 Hyperlipidemia, unspecified: Secondary | ICD-10-CM

## 2022-03-16 DIAGNOSIS — I10 Essential (primary) hypertension: Secondary | ICD-10-CM

## 2022-03-16 DIAGNOSIS — G459 Transient cerebral ischemic attack, unspecified: Secondary | ICD-10-CM | POA: Diagnosis not present

## 2022-03-16 DIAGNOSIS — I4891 Unspecified atrial fibrillation: Secondary | ICD-10-CM

## 2022-03-16 DIAGNOSIS — I491 Atrial premature depolarization: Secondary | ICD-10-CM | POA: Insufficient documentation

## 2022-03-16 DIAGNOSIS — I493 Ventricular premature depolarization: Secondary | ICD-10-CM | POA: Insufficient documentation

## 2022-03-16 DIAGNOSIS — Z79899 Other long term (current) drug therapy: Secondary | ICD-10-CM

## 2022-03-16 LAB — BASIC METABOLIC PANEL
Anion gap: 7 (ref 5–15)
BUN: 21 mg/dL (ref 8–23)
CO2: 23 mmol/L (ref 22–32)
Calcium: 9.1 mg/dL (ref 8.9–10.3)
Chloride: 110 mmol/L (ref 98–111)
Creatinine, Ser: 1.31 mg/dL — ABNORMAL HIGH (ref 0.61–1.24)
GFR, Estimated: >60 mL/min (ref >60)
Glucose, Bld: 178 mg/dL — ABNORMAL HIGH (ref 70–99)
Potassium: 4.3 mmol/L (ref 3.5–5.1)
Sodium: 140 mmol/L (ref 135–145)

## 2022-03-16 LAB — CBC
HCT: 47.1 % (ref 39.0–52.0)
Hemoglobin: 15.4 g/dL (ref 13.0–17.0)
MCH: 29.6 pg (ref 26.0–34.0)
MCHC: 32.7 g/dL (ref 30.0–36.0)
MCV: 90.4 fL (ref 80.0–100.0)
Platelets: 266 10*3/uL (ref 150–400)
RBC: 5.21 MIL/uL (ref 4.22–5.81)
RDW: 13.2 % (ref 11.5–15.5)
WBC: 10.8 10*3/uL — ABNORMAL HIGH (ref 4.0–10.5)
nRBC: 0 % (ref 0.0–0.2)

## 2022-03-16 LAB — TSH: TSH: 1.084 u[IU]/mL (ref 0.350–4.500)

## 2022-03-16 MED ORDER — APIXABAN 5 MG PO TABS: 5.0000 mg | ORAL_TABLET | Freq: Two times a day (BID) | ORAL | 11 refills | Status: DC

## 2022-03-16 MED ORDER — METFORMIN HCL ER 500 MG PO TB24: 2000.0000 mg | ORAL_TABLET | Freq: Every day | ORAL | 0 refills | Status: DC

## 2022-03-16 MED ORDER — METOPROLOL SUCCINATE ER 25 MG PO TB24: 25.0000 mg | ORAL_TABLET | Freq: Every day | ORAL | 3 refills | Status: DC

## 2022-03-16 NOTE — Telephone Encounter (Signed)
Attempted to call the patient with his lab results. His primary contact is for his wife- I have advised her of the patient's lab results (ok per DPR), as the patient is not currently with her.  She voices understanding of these results and Timothy Bernard's recommendations to repeat a BMP & CBC in 1-2 weeks at the [facility].  Timothy Bernard was agreeable with the above plan and will notify the patient. She was very appreciative of the call.

## 2022-03-16 NOTE — Telephone Encounter (Signed)
Rise Mu, PA-C  03/16/2022 11:57 AM EDT     Potassium at goal Random glucose mildly elevated with known diabetes Renal function mildly elevated, though consistent with readings dating back to last month White blood cell count mildly elevated  Blood count normal Thyroid function normal   Continue with treatment plan as discussed in the office today.  Maintain adequate hydration.  Please have him obtain a follow-up BMP and CBC in 1 to 2 weeks to ensure these are stable.

## 2022-03-16 NOTE — Patient Instructions (Addendum)
Medication Instructions:  Your physician has recommended you make the following change in your medication:   START Eliquis 5 mg twice a day STOP Aspirin DECREASE Metoprolol succinate to 25 mg once a day  *If you need a refill on your cardiac medications before your next appointment, please call your pharmacy*   Lab Work: TSH, BMET, CBC today over at the [employer] at [facility] then go to 1st desk on the right to check in (REGISTRATION).   If you have labs (blood work) drawn today and your tests are completely normal, you will receive your results only by: [place] (if you have MyChart) OR A paper copy in the mail If you have any lab test that is abnormal or we need to change your treatment, we will call you to review the results.   Testing/Procedures: None   Follow-Up: At [facility], you and your health needs are our priority.  As part of our continuing mission to provide you with exceptional heart care, we have created designated Provider Care Teams.  These Care Teams include your primary Cardiologist (physician) and Advanced Practice Providers (APPs -  Physician Assistants and Nurse Practitioners) who all work together to provide you with the care you need, when you need it.   Your next appointment:   2 month(s)  The format for your next appointment:   In Person  Provider:   Nelva Bush, MD or Christell Faith, PA-C {    Medication Samples have been provided to the patient.  Drug name: Eliquis 5 mg         Strength: 5 mg         Qty: 4 boxes   LOT: [id number]   Exp.Date: 4/25 & 3/25   Important Information About Sugar

## 2022-03-22 ENCOUNTER — Ambulatory Visit (HOSPITAL_COMMUNITY)
Admission: RE | Admit: 2022-03-22 | Discharge: 2022-03-22 | Disposition: A | Discharge status: Home / Self Care | Payer: Medicare HMO | Source: Ambulatory Visit | Attending: Internal Medicine | Admitting: Internal Medicine

## 2022-03-22 DIAGNOSIS — I712 Thoracic aortic aneurysm, without rupture, unspecified: Secondary | ICD-10-CM | POA: Diagnosis not present

## 2022-03-22 DIAGNOSIS — M2578 Osteophyte, vertebrae: Secondary | ICD-10-CM | POA: Diagnosis not present

## 2022-03-22 MED ORDER — GADOBUTROL 1 MMOL/ML IV SOLN
9.0000 mL | Freq: Once | INTRAVENOUS | Status: AC | PRN
  Administered 2022-03-22: 9 mL via INTRAVENOUS

## 2022-04-02 ENCOUNTER — Other Ambulatory Visit
Admission: RE | Admit: 2022-04-02 | Discharge: 2022-04-02 | Disposition: A | Discharge status: Home / Self Care | Payer: Medicare HMO | Attending: Physician Assistant | Admitting: Physician Assistant

## 2022-04-02 DIAGNOSIS — Z79899 Other long term (current) drug therapy: Secondary | ICD-10-CM | POA: Insufficient documentation

## 2022-04-02 DIAGNOSIS — I4891 Unspecified atrial fibrillation: Secondary | ICD-10-CM | POA: Diagnosis not present

## 2022-04-02 LAB — CBC
HCT: 45.7 % (ref 39.0–52.0)
Hemoglobin: 15.1 g/dL (ref 13.0–17.0)
MCH: 29.7 pg (ref 26.0–34.0)
MCHC: 33 g/dL (ref 30.0–36.0)
MCV: 90 fL (ref 80.0–100.0)
Platelets: 217 10*3/uL (ref 150–400)
RBC: 5.08 MIL/uL (ref 4.22–5.81)
RDW: 12.8 % (ref 11.5–15.5)
WBC: 8.7 10*3/uL (ref 4.0–10.5)
nRBC: 0 % (ref 0.0–0.2)

## 2022-04-02 LAB — BASIC METABOLIC PANEL
Anion gap: 9 (ref 5–15)
BUN: 16 mg/dL (ref 8–23)
CO2: 19 mmol/L — ABNORMAL LOW (ref 22–32)
Calcium: 8.9 mg/dL (ref 8.9–10.3)
Chloride: 110 mmol/L (ref 98–111)
Creatinine, Ser: 1.24 mg/dL (ref 0.61–1.24)
GFR, Estimated: >60 mL/min (ref >60)
Glucose, Bld: 229 mg/dL — ABNORMAL HIGH (ref 70–99)
Potassium: 4.3 mmol/L (ref 3.5–5.1)
Sodium: 138 mmol/L (ref 135–145)

## 2022-04-06 DIAGNOSIS — Z125 Encounter for screening for malignant neoplasm of prostate: Secondary | ICD-10-CM | POA: Diagnosis not present

## 2022-04-13 DIAGNOSIS — N5201 Erectile dysfunction due to arterial insufficiency: Secondary | ICD-10-CM | POA: Diagnosis not present

## 2022-04-16 ENCOUNTER — Other Ambulatory Visit: Payer: Self-pay

## 2022-04-16 NOTE — Patient Outreach (Signed)
[facility]) Care Management  04/16/2022  Timothy Bernard 1956/03/05 [id number]   Telephone Screen    Outreach call to patient to introduce [facility] services and assess care needs as part of benefit of PCP office and insurance plan.No answer. RN CM left HIPAA compliant voicemail message along with contact info.      Plan: RN CM will make outreach attempt to patient within 4 business days.   Enzo Montgomery, RN,BSN,CCM [place] Management Telephonic Care Management Coordinator Direct Phone: [phone] Toll Free: [phone] Fax: [phone]

## 2022-04-21 ENCOUNTER — Other Ambulatory Visit: Payer: Self-pay

## 2022-04-21 NOTE — Patient Outreach (Signed)
[facility]) Care Management  04/21/2022  Timothy Bernard 1956/01/27 [id number]    Telephone Screen       Unsuccessful outreach call to patient to introduce [facility] services and assess care needs as part of benefit of PCP office and insurance plan.       Plan: RN CM will make outreach attempt to patient within 4 business days.  Enzo Montgomery, RN,BSN,CCM [place] Management Telephonic Care Management Coordinator Direct Phone: [phone] Toll Free: [phone] Fax: [phone]

## 2022-04-26 ENCOUNTER — Other Ambulatory Visit: Payer: Self-pay

## 2022-04-26 NOTE — Patient Outreach (Signed)
[facility]) Care Management  04/26/2022  Timothy Bernard 1956-04-28 [id number]   Telephone Screen       Outreach call to patient to introduce [facility] services and assess care needs as part of benefit of PCP office and insurance plan. Spoke with spouse. She reports patient is currently at work. She shares that patient is retiring this week and they are looking forward to it.   Main healthcare issue/concern today: Spouse voices that patient is working on getting his Diabetes regulated. DM MD recently retired and they are seeing new MD. He was started on low dose med. Blood sugar goal is under 150. They have follow up appt this week with MD. Spouse reports they are knowledgeable regarding condition. No issues with meds or transportation. Patient remains independent with ADLs/IADLs.   Health Maintenance/Care Gaps Addressed & Education Provided:   -Last AWV: Appt scheduled for 06/02/2022    Enzo Montgomery, RN,BSN,CCM [place] Management Telephonic Care Management Coordinator Direct Phone: [phone] Toll Free: [phone] Fax: [phone]

## 2022-04-29 ENCOUNTER — Encounter: Payer: Self-pay | Admitting: Internal Medicine

## 2022-04-29 ENCOUNTER — Ambulatory Visit: Payer: Medicare HMO | Admitting: Internal Medicine

## 2022-04-29 VITALS — BP 120/82 | HR 72 | Ht 72.0 in | Wt 211.4 lb

## 2022-04-29 DIAGNOSIS — E119 Type 2 diabetes mellitus without complications: Secondary | ICD-10-CM | POA: Insufficient documentation

## 2022-04-29 DIAGNOSIS — E1159 Type 2 diabetes mellitus with other circulatory complications: Secondary | ICD-10-CM

## 2022-04-29 DIAGNOSIS — E1165 Type 2 diabetes mellitus with hyperglycemia: Secondary | ICD-10-CM

## 2022-04-29 LAB — POCT GLYCOSYLATED HEMOGLOBIN (HGB A1C): Hemoglobin A1C: 8.4 % — AB (ref 4.0–5.6)

## 2022-04-29 MED ORDER — METFORMIN HCL ER 500 MG PO TB24: 2000.0000 mg | ORAL_TABLET | Freq: Every day | ORAL | 3 refills | Status: DC

## 2022-04-29 MED ORDER — CANAGLIFLOZIN 300 MG PO TABS: 300.0000 mg | ORAL_TABLET | Freq: Every day | ORAL | 3 refills | Status: DC

## 2022-04-29 MED ORDER — RYBELSUS 7 MG PO TABS: 7.0000 mg | ORAL_TABLET | Freq: Every day | ORAL | 3 refills | Status: DC
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
## 2022-04-29 NOTE — Patient Instructions (Signed)
INcrease Rybelsus 7 mg , 1 tablet daily  Continue Invokana 300 mg daily  Continue Metformin 500 mg, two tablets twice daily     HOW TO TREAT LOW BLOOD SUGARS (Blood sugar LESS THAN 70 MG/DL) Please follow the RULE OF 15 for the treatment of hypoglycemia treatment (when your (blood sugars are less than 70 mg/dL)   STEP 1: Take 15 grams of carbohydrates when your blood sugar is low, which includes:  3-4 GLUCOSE TABS  OR 3-4 OZ OF JUICE OR REGULAR SODA OR ONE TUBE OF GLUCOSE GEL    STEP 2: RECHECK blood sugar in 15 MINUTES STEP 3: If your blood sugar is still low at the 15 minute recheck --> then, go back to STEP 1 and treat AGAIN with another 15 grams of carbohydrates.

## 2022-04-30 ENCOUNTER — Encounter: Payer: Self-pay | Admitting: Internal Medicine

## 2022-05-06 DIAGNOSIS — H43392 Other vitreous opacities, left eye: Secondary | ICD-10-CM | POA: Diagnosis not present

## 2022-05-06 DIAGNOSIS — H59811 Chorioretinal scars after surgery for detachment, right eye: Secondary | ICD-10-CM | POA: Diagnosis not present

## 2022-05-06 DIAGNOSIS — H43812 Vitreous degeneration, left eye: Secondary | ICD-10-CM | POA: Diagnosis not present

## 2022-06-02 ENCOUNTER — Encounter: Payer: Self-pay | Admitting: Internal Medicine

## 2022-06-02 ENCOUNTER — Ambulatory Visit (INDEPENDENT_AMBULATORY_CARE_PROVIDER_SITE_OTHER): Payer: Medicare HMO | Admitting: Internal Medicine

## 2022-06-02 VITALS — BP 104/70 | HR 70 | Temp 97.6°F | Ht 71.0 in | Wt 208.0 lb

## 2022-06-02 DIAGNOSIS — I350 Nonrheumatic aortic (valve) stenosis: Secondary | ICD-10-CM | POA: Diagnosis not present

## 2022-06-02 DIAGNOSIS — E1159 Type 2 diabetes mellitus with other circulatory complications: Secondary | ICD-10-CM

## 2022-06-02 DIAGNOSIS — Z8673 Personal history of transient ischemic attack (TIA), and cerebral infarction without residual deficits: Secondary | ICD-10-CM

## 2022-06-02 DIAGNOSIS — Z0000 Encounter for general adult medical examination without abnormal findings: Secondary | ICD-10-CM

## 2022-06-02 DIAGNOSIS — I712 Thoracic aortic aneurysm, without rupture, unspecified: Secondary | ICD-10-CM

## 2022-06-02 DIAGNOSIS — I10 Essential (primary) hypertension: Secondary | ICD-10-CM | POA: Diagnosis not present

## 2022-06-02 DIAGNOSIS — Z23 Encounter for immunization: Secondary | ICD-10-CM | POA: Diagnosis not present

## 2022-06-02 LAB — HM DIABETES FOOT EXAM

## 2022-06-02 NOTE — Assessment & Plan Note (Signed)
Stable on recent CT angio

## 2022-06-02 NOTE — Assessment & Plan Note (Signed)
BP Readings from Last 3 Encounters:  06/02/22 104/70  04/29/22 120/82  03/16/22 100/80   On metoprolol '25mg'$  daily and lisinopril 10

## 2022-06-02 NOTE — Assessment & Plan Note (Signed)
Lab Results  Component Value Date   HGBA1C 8.4 (A) 04/29/2022   Needs to improve control rybelsus now up to 7 Metformin 2000 mg daily invokana 300 daily Sees DR Uh [facility]
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
## 2022-06-02 NOTE — Assessment & Plan Note (Signed)
Now on eliquis 5 bid crestor 5 BP fine

## 2022-06-02 NOTE — Progress Notes (Deleted)
Hearing Screening - Comments:: Passed whisper test Vision Screening - Comments:: February 2023  

## 2022-06-02 NOTE — Assessment & Plan Note (Signed)
No symptoms 

## 2022-06-02 NOTE — Addendum Note (Signed)
Addended by: Pilar Grammes on: 06/02/2022 10:04 AM   Modules accepted: Orders

## 2022-06-02 NOTE — Assessment & Plan Note (Signed)
I have personally reviewed the Medicare Annual Wellness questionnaire and have noted 1. The patient's medical and social history 2. Their use of alcohol, tobacco or illicit drugs 3. Their current medications and supplements 4. The patient's functional ability including ADL's, fall risks, home safety risks and hearing or visual             impairment. 5. Diet and physical activities 6. Evidence for depression or mood disorders  The patients weight, height, BMI and visual acuity have been recorded in the chart I have made referrals, counseling and provided education to the patient based review of the above and I have provided the pt with a written personalized care plan for preventive services.  I have provided you with a copy of your personalized plan for preventive services. Please take the time to review along with your updated medication list.  Increasing exercise Colon due next year Had recent PSA Flu vaccine today Updated COVID soon--at pharmacy

## 2022-06-02 NOTE — Progress Notes (Signed)
Hearing Screening  Method: Audiometry   '500Hz'$  '1000Hz'$  '2000Hz'$  '4000Hz'$   Right ear '20 20 20 20  '$ Left ear '20 20 20 20  '$ Comments: Passed whisper test  Vision Screening   Right eye Left eye Both eyes  Without correction     With correction '20/20 20/20 20/20 '$  Comments: February 2023

## 2022-06-04 ENCOUNTER — Encounter: Payer: Self-pay | Admitting: Internal Medicine

## 2022-06-04 ENCOUNTER — Ambulatory Visit: Payer: Medicare HMO | Attending: Internal Medicine | Admitting: Internal Medicine

## 2022-06-04 VITALS — BP 118/76 | HR 72 | Ht 72.0 in | Wt 210.6 lb

## 2022-06-04 DIAGNOSIS — I440 Atrioventricular block, first degree: Secondary | ICD-10-CM

## 2022-06-04 DIAGNOSIS — I10 Essential (primary) hypertension: Secondary | ICD-10-CM

## 2022-06-04 DIAGNOSIS — I480 Paroxysmal atrial fibrillation: Secondary | ICD-10-CM

## 2022-06-04 DIAGNOSIS — I350 Nonrheumatic aortic (valve) stenosis: Secondary | ICD-10-CM | POA: Diagnosis not present

## 2022-06-04 DIAGNOSIS — E785 Hyperlipidemia, unspecified: Secondary | ICD-10-CM

## 2022-06-04 DIAGNOSIS — Z79899 Other long term (current) drug therapy: Secondary | ICD-10-CM | POA: Diagnosis not present

## 2022-06-04 DIAGNOSIS — Q231 Congenital insufficiency of aortic valve: Secondary | ICD-10-CM | POA: Diagnosis not present

## 2022-06-04 DIAGNOSIS — I7121 Aneurysm of the ascending aorta, without rupture: Secondary | ICD-10-CM | POA: Diagnosis not present

## 2022-06-04 DIAGNOSIS — E1169 Type 2 diabetes mellitus with other specified complication: Secondary | ICD-10-CM
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
## 2022-06-04 NOTE — Patient Instructions (Signed)
Medication Instructions:  Your physician recommends that you continue on your current medications as directed. Please refer to the Current Medication list given to you today.  *If you need a refill on your cardiac medications before your next appointment, please call your pharmacy*   Lab Work: None ordered If you have labs (blood work) drawn today and your tests are completely normal, you will receive your results only by: [place] (if you have MyChart) OR A paper copy in the mail If you have any lab test that is abnormal or we need to change your treatment, we will call you to review the results.   Testing/Procedures: 1.Your physician has requested that you have an echocardiogram prior to 1 year follow up appointment. Echocardiography is a painless test that uses sound waves to create images of your heart. It provides your doctor with information about the size and shape of your heart and how well your heart's chambers and valves are working. This procedure takes approximately one hour. There are no restrictions for this procedure.   2.Your physician has recommended that you have a MRA of the chest in July 2024. Please call [phone] to schedule.   Follow-Up: At [facility], you and your health needs are our priority.  As part of our continuing mission to provide you with exceptional heart care, we have created designated Provider Care Teams.  These Care Teams include your primary Cardiologist (physician) and Advanced Practice Providers (APPs -  Physician Assistants and Nurse Practitioners) who all work together to provide you with the care you need, when you need it.   Your next appointment:   1 year(s)  The format for your next appointment:   In Person  Provider:   You may see Nelva Bush, MD or one of the following Advanced Practice Providers on your designated Care Team:   Murray Hodgkins, NP Christell Faith, PA-C Cadence Kathlen Mody, PA-C Gerrie Nordmann, NP    Other Instructions Call and let us know if you continue to have orthostatic lightheadedness.   Important Information About Sugar

## 2022-06-11 ENCOUNTER — Other Ambulatory Visit: Payer: Self-pay | Admitting: Internal Medicine

## 2022-07-07 ENCOUNTER — Telehealth: Payer: Self-pay | Admitting: Internal Medicine

## 2022-07-07 MED ORDER — LISINOPRIL 10 MG PO TABS: ORAL_TABLET | ORAL | 3 refills | Status: DC

## 2022-07-07 NOTE — Telephone Encounter (Signed)
Rx sent electronically.  

## 2022-07-07 NOTE — Telephone Encounter (Signed)
  Encourage patient to contact the pharmacy for refills or they can request refills through [facility]  Did the patient contact the pharmacy: No  LAST APPOINTMENT DATE: 06/02/2022  NEXT APPOINTMENT DATE: 06/06/2023  MEDICATION: lisinopril (ZESTRIL) 10 MG tablet   Is the patient out of medication? No  If not, how much is left? A few left  PH[facility]AC[facility]  [id number] [address]   Let patient know to contact pharmacy at the end of the day to make sure medication is ready.  Please notify patient to allow 48-72 hours to process

## 2022-07-13 IMAGING — MR MR MRA HEAD W/O CM
1 series · 20 of 48 positions shown · non-contrast
Comparison: None Available.

CLINICAL DATA: Stroke, follow-up, left-sided numbness

EXAM:
MRA HEAD WITHOUT CONTRAST
TECHNIQUE: Angiographic images of the Circle of Willis were acquired using MRA
technique without intravenous contrast.

[Series 2: TOF · axial · 0.4mm · 0.43mm/px · z∈[-91,+15]mm · 20 of 276 slices shown]
[im 1/276]
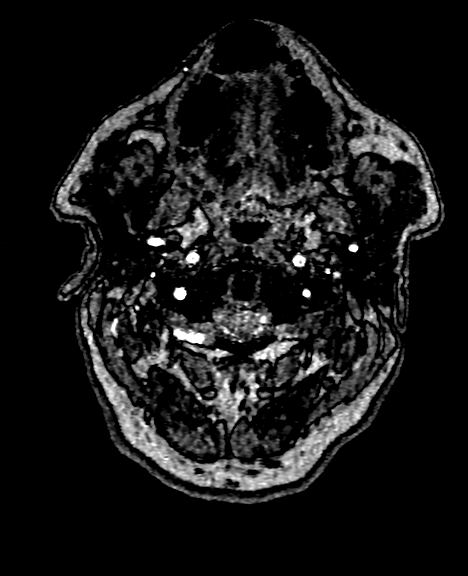
[im 6/276]
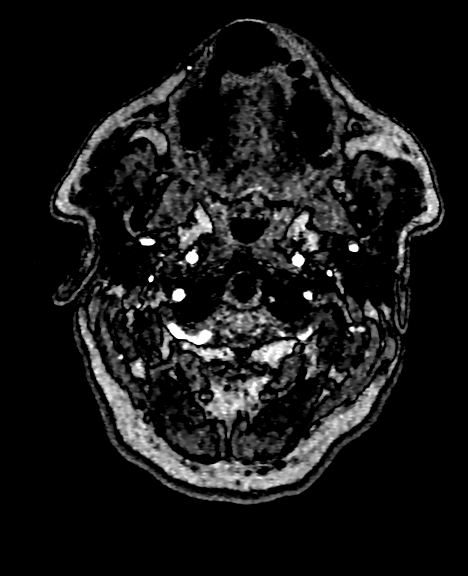
[im 12/276]
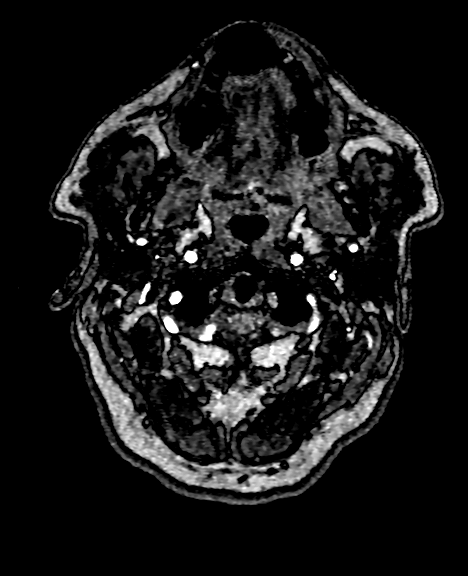
[im 18/276]
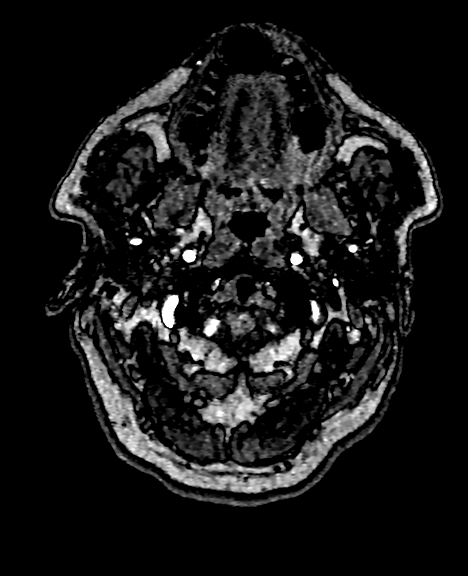
[im 24/276]
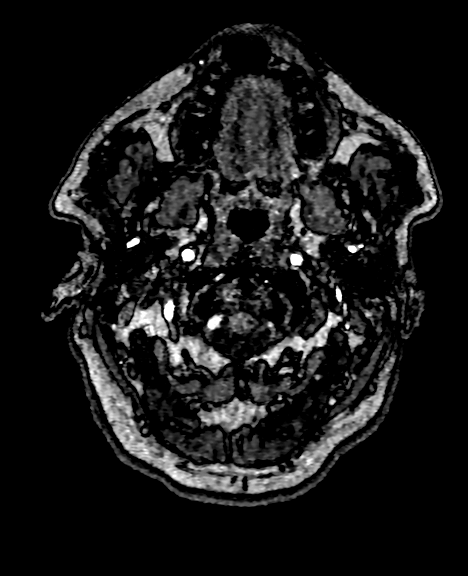
[im 30/276]
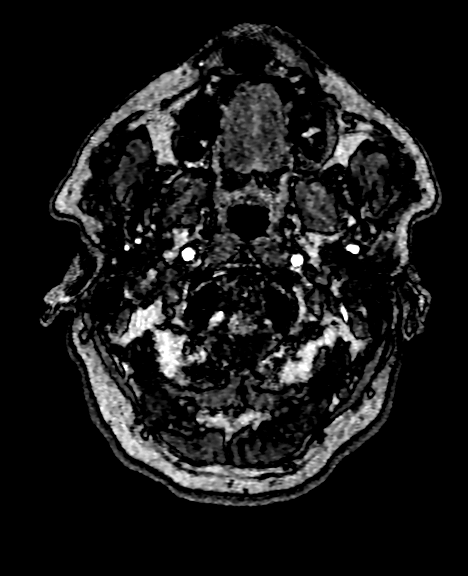
[im 36/276]
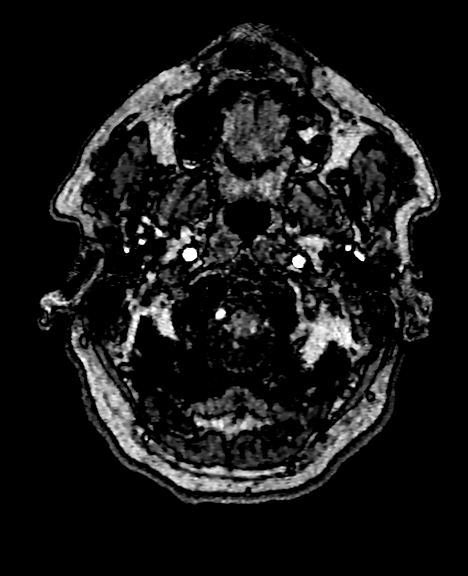
[im 41/276]
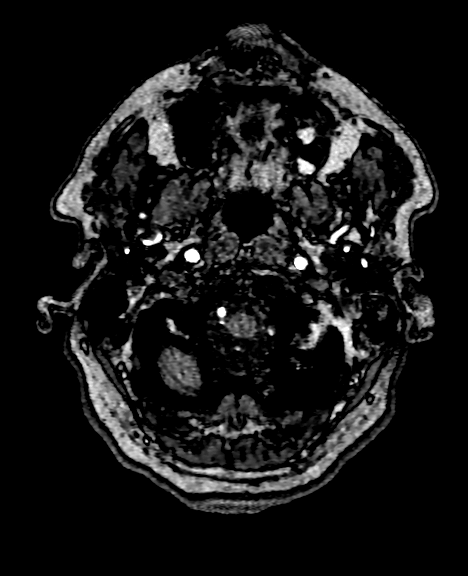
[im 47/276]
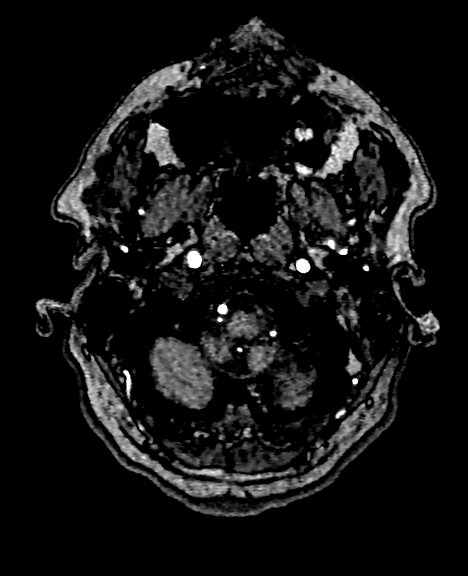
[im 53/276]
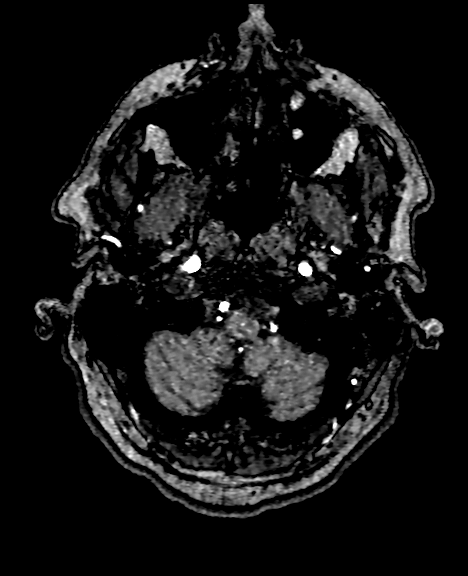
[im 59/276]
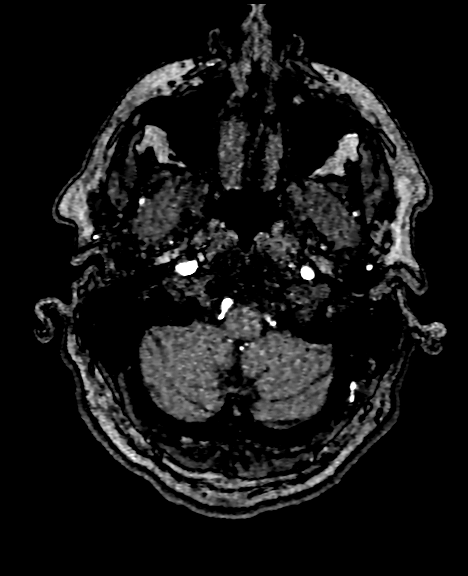
[im 65/276]
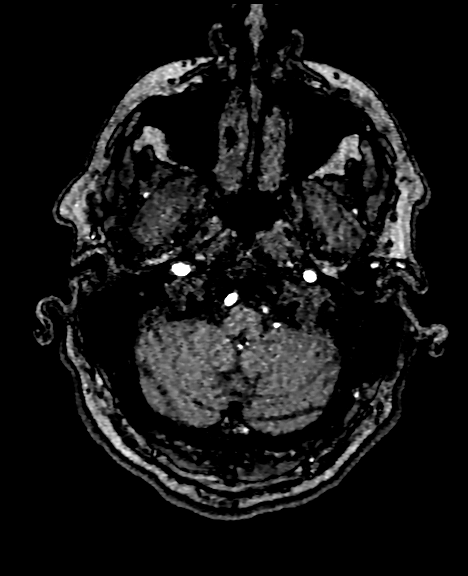
[im 88/276]
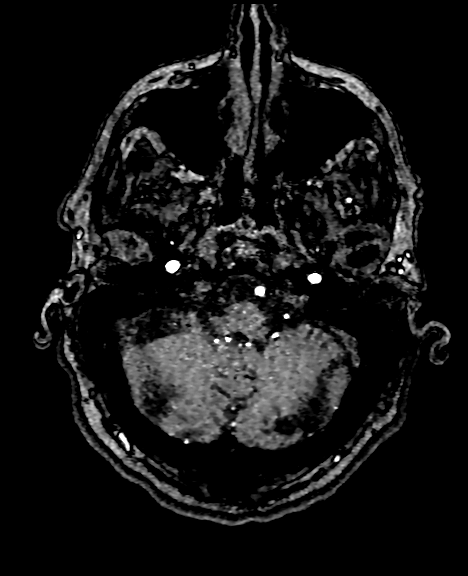
[im 123/276]
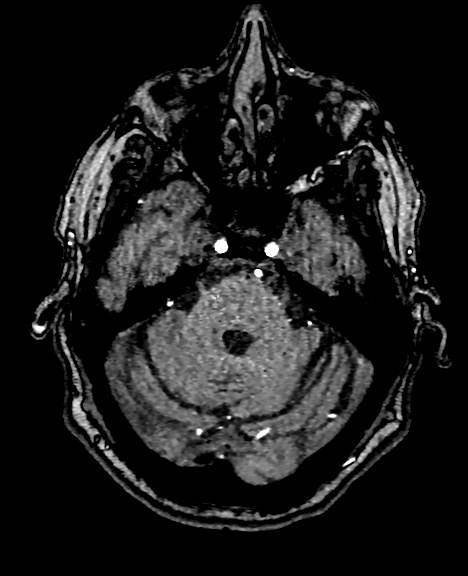
[im 141/276]
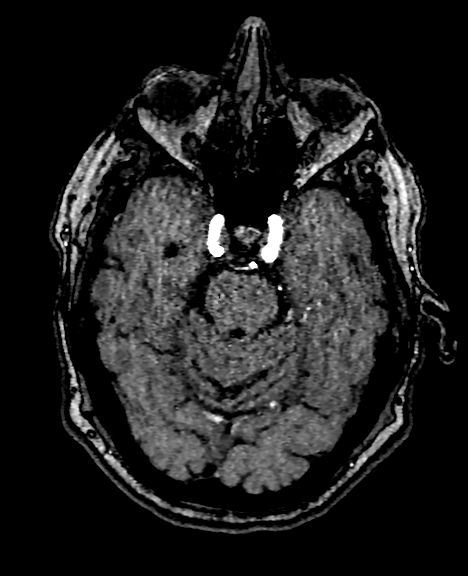
[im 158/276]
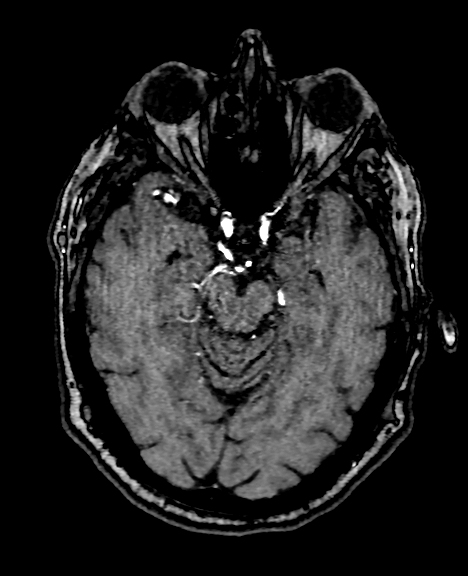
[im 194/276]
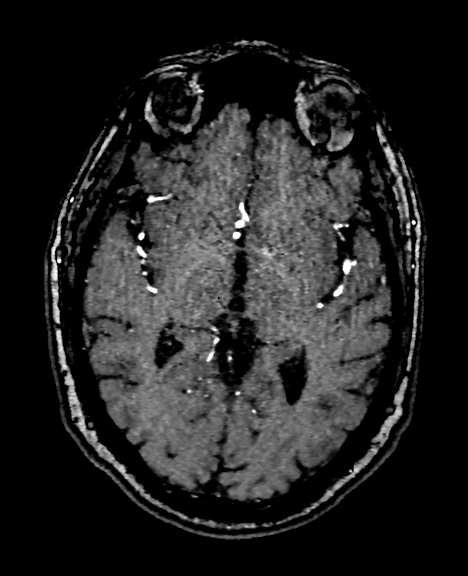
[im 229/276]
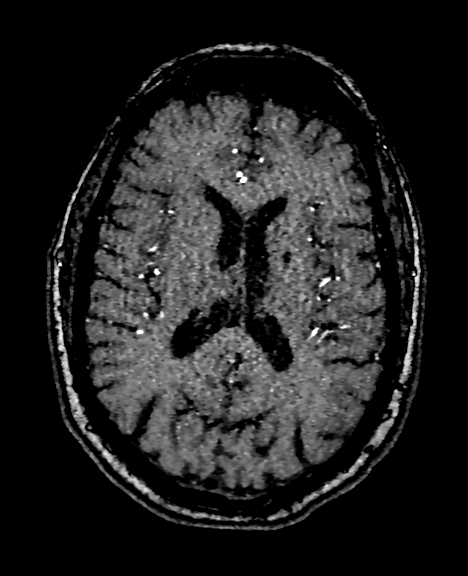
[im 235/276]
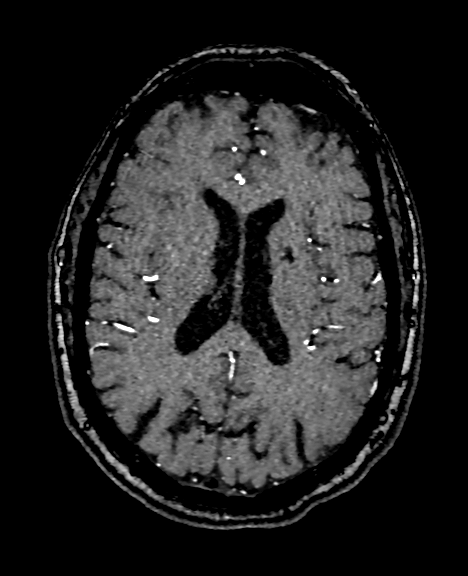
[im 264/276]
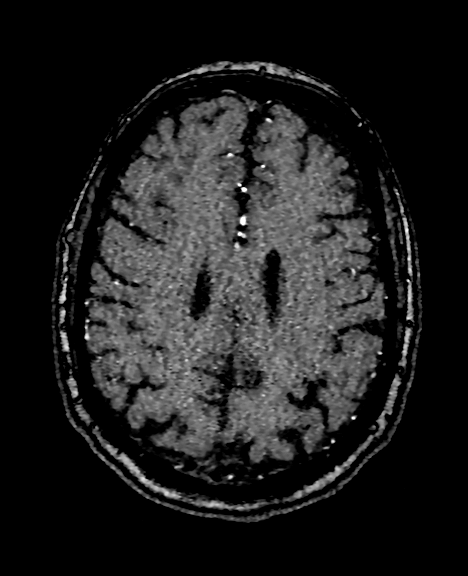

[20 of 48 positions shown; findings below may reference images not displayed]

FINDINGS: Anterior circulation: Intracranial internal carotid arteries are
patent. Anterior and middle cerebral arteries are patent.

Posterior circulation: Intracranial vertebral arteries are patent.
Left vertebral be comes diminutive after PICA origin. Basilar artery
is patent. Posterior cerebral arteries are patent. Right posterior
communicating artery is present.
IMPRESSION: No proximal intracranial vessel occlusion or significant stenosis.
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
## 2022-08-11 ENCOUNTER — Encounter: Payer: Self-pay | Admitting: Internal Medicine

## 2022-08-11 ENCOUNTER — Ambulatory Visit: Payer: Medicare HMO | Admitting: Internal Medicine

## 2022-08-11 VITALS — BP 124/80 | HR 72 | Wt 207.0 lb

## 2022-08-11 DIAGNOSIS — E1159 Type 2 diabetes mellitus with other circulatory complications: Secondary | ICD-10-CM | POA: Diagnosis not present

## 2022-08-11 DIAGNOSIS — E1165 Type 2 diabetes mellitus with hyperglycemia: Secondary | ICD-10-CM

## 2022-08-11 LAB — POCT GLYCOSYLATED HEMOGLOBIN (HGB A1C): Hemoglobin A1C: 7.2 % — AB (ref 4.0–5.6)

## 2022-08-11 MED ORDER — CANAGLIFLOZIN 300 MG PO TABS: 300.0000 mg | ORAL_TABLET | Freq: Every day | ORAL | 3 refills | Status: DC

## 2022-08-11 NOTE — Patient Instructions (Signed)
  Continue Invokana 300 mg daily  Continue Metformin 500 mg, two tablets twice daily     HOW TO TREAT LOW BLOOD SUGARS (Blood sugar LESS THAN 70 MG/DL) Please follow the RULE OF 15 for the treatment of hypoglycemia treatment (when your (blood sugars are less than 70 mg/dL)   STEP 1: Take 15 grams of carbohydrates when your blood sugar is low, which includes:  3-4 GLUCOSE TABS  OR 3-4 OZ OF JUICE OR REGULAR SODA OR ONE TUBE OF GLUCOSE GEL    STEP 2: RECHECK blood sugar in 15 MINUTES STEP 3: If your blood sugar is still low at the 15 minute recheck --> then, go back to STEP 1 and treat AGAIN with another 15 grams of carbohydrates.

## 2022-09-22 ENCOUNTER — Other Ambulatory Visit: Payer: Self-pay | Admitting: Internal Medicine

## 2022-10-07 ENCOUNTER — Ambulatory Visit: Payer: Medicare HMO | Admitting: Family Medicine

## 2022-10-27 ENCOUNTER — Ambulatory Visit (INDEPENDENT_AMBULATORY_CARE_PROVIDER_SITE_OTHER): Payer: Medicare HMO | Admitting: Family Medicine

## 2022-10-27 ENCOUNTER — Encounter: Payer: Self-pay | Admitting: Family Medicine

## 2022-10-27 VITALS — BP 130/84 | HR 68 | Temp 98.5°F | Ht 73.0 in | Wt 204.0 lb

## 2022-10-27 DIAGNOSIS — E785 Hyperlipidemia, unspecified: Secondary | ICD-10-CM

## 2022-10-27 DIAGNOSIS — I10 Essential (primary) hypertension: Secondary | ICD-10-CM

## 2022-10-27 DIAGNOSIS — L989 Disorder of the skin and subcutaneous tissue, unspecified: Secondary | ICD-10-CM | POA: Insufficient documentation

## 2022-10-27 DIAGNOSIS — Z7689 Persons encountering health services in other specified circumstances: Secondary | ICD-10-CM | POA: Insufficient documentation

## 2022-10-27 DIAGNOSIS — E1169 Type 2 diabetes mellitus with other specified complication: Secondary | ICD-10-CM

## 2022-10-27 DIAGNOSIS — I7121 Aneurysm of the ascending aorta, without rupture: Secondary | ICD-10-CM

## 2022-10-27 DIAGNOSIS — I480 Paroxysmal atrial fibrillation: Secondary | ICD-10-CM

## 2022-10-27 DIAGNOSIS — E1165 Type 2 diabetes mellitus with hyperglycemia: Secondary | ICD-10-CM | POA: Diagnosis not present

## 2022-10-27 NOTE — Assessment & Plan Note (Signed)
Chronic. Well controlled on Metoprolol '25mg'$  daily. He has stopped taking his Lisinopril '10mg'$  daily. Denis chest pain, palpitations, dyspnea, swelling of extremities, headaches, vision changes. Encourage heart healthy diet and 150 minutes of exercise weekly as tolerated.

## 2022-10-27 NOTE — Assessment & Plan Note (Signed)
Stable. BP well controlled.

## 2022-10-27 NOTE — Patient Instructions (Signed)
It was great to meet you today and I'm excited to have you join the [facility]. I hope you had a positive experience today! If you feel so inclined, please feel free to recommend our practice to friends and family. Mila Merry, FNP-C

## 2022-10-27 NOTE — Assessment & Plan Note (Signed)
Will refer to Dermatology for evaluation and possible biopsy.

## 2022-10-27 NOTE — Assessment & Plan Note (Addendum)
I have personally reviewed his most recent medical records including prior PCP, Endocrinology, and Cardiology. HM items up to date. Will return in 7 months for physical with labs as he is seeing Endocrinology in 2 months and Cardiology in 6 months.
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
## 2022-10-27 NOTE — Assessment & Plan Note (Signed)
Well controlled on Invokana '300mg'$  daily and Metformin '1000mg'$  daily. He is not taking Metformin BID as prescribed. Last A1c 7.2%. Continue checking fasting BG and limit carbohydrate intake.

## 2022-10-27 NOTE — Assessment & Plan Note (Signed)
Chronic. On Eliquis '5mg'$  daily and Metoprolol '25mg'$  daily.

## 2022-11-17 DIAGNOSIS — E113211 Type 2 diabetes mellitus with mild nonproliferative diabetic retinopathy with macular edema, right eye: Secondary | ICD-10-CM | POA: Diagnosis not present

## 2022-11-17 DIAGNOSIS — H26493 Other secondary cataract, bilateral: Secondary | ICD-10-CM | POA: Diagnosis not present

## 2022-11-17 DIAGNOSIS — H35033 Hypertensive retinopathy, bilateral: Secondary | ICD-10-CM | POA: Diagnosis not present

## 2022-11-17 DIAGNOSIS — H59811 Chorioretinal scars after surgery for detachment, right eye: Secondary | ICD-10-CM | POA: Diagnosis not present

## 2022-11-17 LAB — HM DIABETES EYE EXAM

## 2022-11-22 ENCOUNTER — Encounter: Payer: Self-pay | Admitting: Family Medicine

## 2022-11-22 ENCOUNTER — Other Ambulatory Visit: Payer: Self-pay | Admitting: Internal Medicine

## 2022-12-07 ENCOUNTER — Encounter: Payer: Self-pay | Admitting: Dermatology

## 2022-12-07 ENCOUNTER — Ambulatory Visit: Payer: Medicare HMO | Admitting: Dermatology

## 2022-12-07 VITALS — BP 130/84 | HR 75

## 2022-12-07 DIAGNOSIS — W908XXA Exposure to other nonionizing radiation, initial encounter: Secondary | ICD-10-CM | POA: Diagnosis not present

## 2022-12-07 DIAGNOSIS — Z1283 Encounter for screening for malignant neoplasm of skin: Secondary | ICD-10-CM

## 2022-12-07 DIAGNOSIS — L814 Other melanin hyperpigmentation: Secondary | ICD-10-CM | POA: Diagnosis not present

## 2022-12-07 DIAGNOSIS — L578 Other skin changes due to chronic exposure to nonionizing radiation: Secondary | ICD-10-CM

## 2022-12-07 DIAGNOSIS — Q825 Congenital non-neoplastic nevus: Secondary | ICD-10-CM

## 2022-12-07 DIAGNOSIS — L821 Other seborrheic keratosis: Secondary | ICD-10-CM

## 2022-12-07 DIAGNOSIS — L570 Actinic keratosis: Secondary | ICD-10-CM

## 2022-12-07 DIAGNOSIS — X32XXXA Exposure to sunlight, initial encounter: Secondary | ICD-10-CM

## 2022-12-07 DIAGNOSIS — D225 Melanocytic nevi of trunk: Secondary | ICD-10-CM

## 2022-12-07 NOTE — Progress Notes (Unsigned)
New Patient Visit   Subjective  Timothy Bernard is a 67 y.o. male who presents for the following: Skin Cancer Screening and Full Body Skin Exam  The patient presents for Total-Body Skin Exam (TBSE) for skin cancer screening and mole check. The patient has spots, moles and lesions to be evaluated, some may be new or changing and the patient has concerns that these could be cancer.  Patient is concerned of a scaly spot in his left dorsal hand x 5 years. And a discolored spot on his right cheek x 2 years with no changes noted.   No personal or family history of skin cancer.   The following portions of the chart were reviewed this encounter and updated as appropriate: medications, allergies, medical history  Review of Systems:  No other skin or systemic complaints except as noted in HPI or Assessment and Plan.  Objective  Well appearing patient in no apparent distress; mood and affect are within normal limits.  A full examination was performed including scalp, head, eyes, ears, nose, lips, neck, chest, axillae, abdomen, back, buttocks, bilateral upper extremities, bilateral lower extremities, hands, feet, fingers, toes, fingernails, and toenails. All findings within normal limits unless otherwise noted below.   Relevant physical exam findings are noted in the Assessment and Plan.    Assessment & Plan   LENTIGINES, SEBORRHEIC KERATOSES, HEMANGIOMAS - Benign normal skin lesions - Benign-appearing - Call for any changes  ACTINIC KERATOSIS Exam: Erythematous thin papules/macules with gritty scale  Actinic keratoses are precancerous spots that appear secondary to cumulative UV radiation exposure/sun exposure over time. They are chronic with expected duration over 1 year. A portion of actinic keratoses will progress to squamous cell carcinoma of the skin. It is not possible to reliably predict which spots will progress to skin cancer and so treatment is recommended to prevent development  of skin cancer.  Recommend daily broad spectrum sunscreen SPF 30+ to sun-exposed areas, reapply every 2 hours as needed.  Recommend staying in the shade or wearing long sleeves, sun glasses (UVA+UVB protection) and wide brim hats (4-inch brim around the entire circumference of the hat). Call for new or changing lesions.  Treatment Plan:  Prior to procedure, discussed risks of blister formation, small wound, skin dyspigmentation, or rare scar following cryotherapy. Recommend Vaseline ointment to treated areas while healing.  Destruction Procedure Note Destruction method: cryotherapy   Informed consent: discussed and consent obtained   Lesion destroyed using liquid nitrogen: Yes   Outcome: patient tolerated procedure well with no complications   Post-procedure details: wound care instructions given   Locations: forehead, right temple, left dorsal hand # of Lesions Treated: 4  SEBORRHEIC KERATOSIS Right medial thigh - Stuck-on, waxy, tan-brown papules and/or plaques  - Benign-appearing - Discussed benign etiology and prognosis. - Observe - Call for any changes      MELANOCYTIC NEVI - Tan-brown and/or pink-flesh-colored symmetric macules and papules - Benign appearing on exam today - Observation - Call clinic for new or changing moles - Recommend daily use of broad spectrum spf 30+ sunscreen to sun-exposed areas.   ACTINIC DAMAGE - Chronic condition, secondary to cumulative UV/sun exposure - diffuse scaly erythematous macules with underlying dyspigmentation - Recommend daily broad spectrum sunscreen SPF 30+ to sun-exposed areas, reapply every 2 hours as needed.  - Staying in the shade or wearing long sleeves, sun glasses (UVA+UVB protection) and wide brim hats (4-inch brim around the entire circumference of the hat) are also recommended for sun protection.  -  Call for new or changing lesions.  SKIN CANCER SCREENING PERFORMED TODAY.      No follow-ups on file. Jaclynn Guarneri, CMA, am acting as scribe for Langston Reusing, MD.     Documentation: I have reviewed the above documentation for accuracy and completeness, and I agree with the above.  Langston Reusing, MD

## 2022-12-07 NOTE — Patient Instructions (Addendum)
Cryotherapy Aftercare  Wash gently with soap and water everyday.   Apply Vaseline and Band-Aid daily until healed.     Recommend daily broad spectrum sunscreen SPF 30+ to sun-exposed areas, reapply every 2 hours as needed. Call for new or changing lesions.  Staying in the shade or wearing long sleeves, sun glasses (UVA+UVB protection) and wide brim hats (4-inch brim around the entire circumference of the hat) are also recommended for sun protection.   Melanoma ABCDEs  Melanoma is the most dangerous type of skin cancer, and is the leading cause of death from skin disease.  You are more likely to develop melanoma if you: Have light-colored skin, light-colored eyes, or red or blond hair Spend a lot of time in the sun Tan regularly, either outdoors or in a tanning bed Have had blistering sunburns, especially during childhood Have a close family member who has had a melanoma Have atypical moles or large birthmarks  Early detection of melanoma is key since treatment is typically straightforward and cure rates are extremely high if we catch it early.   The first sign of melanoma is often a change in a mole or a new dark spot.  The ABCDE system is a way of remembering the signs of melanoma.  A for asymmetry:  The two halves do not match. B for border:  The edges of the growth are irregular. C for color:  A mixture of colors are present instead of an even brown color. D for diameter:  Melanomas are usually (but not always) greater than 10mm - the size of a pencil eraser. E for evolution:  The spot keeps changing in size, shape, and color.  Please check your skin once per month between visits. You can use a small mirror in front and a large mirror behind you to keep an eye on the back side or your body.   If you see any new or changing lesions before your next follow-up, please call to schedule a visit.  Please continue daily skin protection including broad spectrum sunscreen SPF 30+ to  sun-exposed areas, reapplying every 2 hours as needed when you're outdoors.   Staying in the shade or wearing long sleeves, sun glasses (UVA+UVB protection) and wide brim hats (4-inch brim around the entire circumference of the hat) are also recommended for sun protection.    Due to recent changes in healthcare laws, you may see results of your pathology and/or laboratory studies on MyChart before the doctors have had a chance to review them. We understand that in some cases there may be results that are confusing or concerning to you. Please understand that not all results are received at the same time and often the doctors may need to interpret multiple results in order to provide you with the best plan of care or course of treatment. Therefore, we ask that you please give [place] 2 business days to thoroughly review all your results before contacting the office for clarification. Should we see a critical lab result, you will be contacted sooner.   If You Need Anything After Your Visit  If you have any questions or concerns for your doctor, please call our main line at [phone] If no one answers, please leave a voicemail as directed and we will return your call as soon as possible. Messages left after 4 pm will be answered the following business day.   You may also send [place] a message via MyChart. We typically respond to MyChart messages within 1-2 business  days.  For prescription refills, please ask your pharmacy to contact our office. Our fax number is [phone].  If you have an urgent issue when the clinic is closed that cannot wait until the next business day, you can page your doctor at the number below.    Please note that while we do our best to be available for urgent issues outside of office hours, we are not available 24/7.   If you have an urgent issue and are unable to reach [place], you may choose to seek medical care at your doctor's office, retail clinic, urgent care center, or  emergency room.  If you have a medical emergency, please immediately call 911 or go to the emergency department. In the event of inclement weather, please call our main line at [phone] for an update on the status of any delays or closures.  Dermatology Medication Tips: Please keep the boxes that topical medications come in in order to help keep track of the instructions about where and how to use these. Pharmacies typically print the medication instructions only on the boxes and not directly on the medication tubes.   If your medication is too expensive, please contact our office at [phone] or send [place] a message through MyChart.   We are unable to tell what your co-pay for medications will be in advance as this is different depending on your insurance coverage. However, we may be able to find a substitute medication at lower cost or fill out paperwork to get insurance to cover a needed medication.   If a prior authorization is required to get your medication covered by your insurance company, please allow [place] 1-2 business days to complete this process.  Drug prices often vary depending on where the prescription is filled and some pharmacies may offer cheaper prices.  The website www.goodrx.com contains coupons for medications through different pharmacies. The prices here do not account for what the cost may be with help from insurance (it may be cheaper with your insurance), but the website can give you the price if you did not use any insurance.  - You can print the associated coupon and take it with your prescription to the pharmacy.  - You may also stop by our office during regular business hours and pick up a GoodRx coupon card.  - If you need your prescription sent electronically to a different pharmacy, notify our office through [facility] or by phone at [phone]

## 2023-01-21 ENCOUNTER — Encounter: Payer: Self-pay | Admitting: Internal Medicine

## 2023-02-10 ENCOUNTER — Encounter: Payer: Self-pay | Admitting: Internal Medicine

## 2023-02-10 ENCOUNTER — Ambulatory Visit: Payer: Medicare HMO | Admitting: Internal Medicine

## 2023-02-10 VITALS — BP 122/72 | HR 71 | Ht 73.0 in | Wt 199.6 lb

## 2023-02-10 DIAGNOSIS — E1165 Type 2 diabetes mellitus with hyperglycemia: Secondary | ICD-10-CM

## 2023-02-10 DIAGNOSIS — Z7984 Long term (current) use of oral hypoglycemic drugs: Secondary | ICD-10-CM

## 2023-02-10 DIAGNOSIS — E1159 Type 2 diabetes mellitus with other circulatory complications: Secondary | ICD-10-CM | POA: Diagnosis not present

## 2023-02-10 LAB — POCT GLYCOSYLATED HEMOGLOBIN (HGB A1C): Hemoglobin A1C: 7.3 % — AB (ref 4.0–5.6)

## 2023-02-10 MED ORDER — GLIMEPIRIDE 1 MG PO TABS
1.0000 mg | ORAL_TABLET | Freq: Every day | ORAL | 3 refills | Status: DC
Start: 2023-02-10 — End: 2023-08-17

## 2023-02-10 MED ORDER — METFORMIN HCL ER 500 MG PO TB24: 500.0000 mg | ORAL_TABLET | Freq: Every day | ORAL | 3 refills | Status: DC

## 2023-02-10 MED ORDER — CANAGLIFLOZIN 300 MG PO TABS: 300.0000 mg | ORAL_TABLET | Freq: Every day | ORAL | 3 refills | Status: DC
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
## 2023-02-10 NOTE — Patient Instructions (Signed)
Glimepiride 1 mg , 1 tablet before Breakfast  Continue Invokana 300 mg daily  Continue Metformin 500 mg, 1 tablet daily     HOW TO TREAT LOW BLOOD SUGARS (Blood sugar LESS THAN 70 MG/DL) Please follow the RULE OF 15 for the treatment of hypoglycemia treatment (when your (blood sugars are less than 70 mg/dL)   STEP 1: Take 15 grams of carbohydrates when your blood sugar is low, which includes:  3-4 GLUCOSE TABS  OR 3-4 OZ OF JUICE OR REGULAR SODA OR ONE TUBE OF GLUCOSE GEL    STEP 2: RECHECK blood sugar in 15 MINUTES STEP 3: If your blood sugar is still low at the 15 minute recheck --> then, go back to STEP 1 and treat AGAIN with another 15 grams of carbohydrates.

## 2023-03-05 ENCOUNTER — Other Ambulatory Visit: Payer: Self-pay | Admitting: Internal Medicine

## 2023-03-24 ENCOUNTER — Ambulatory Visit
Admission: RE | Admit: 2023-03-24 | Discharge: 2023-03-24 | Disposition: A | Discharge status: Home / Self Care | Payer: Medicare HMO | Source: Ambulatory Visit | Attending: Internal Medicine | Admitting: Internal Medicine

## 2023-03-24 DIAGNOSIS — I7121 Aneurysm of the ascending aorta, without rupture: Secondary | ICD-10-CM | POA: Diagnosis not present

## 2023-03-24 MED ORDER — GADOBUTROL 1 MMOL/ML IV SOLN
9.0000 mL | Freq: Once | INTRAVENOUS | Status: AC | PRN
  Administered 2023-03-24: 9 mL via INTRAVENOUS

## 2023-03-29 ENCOUNTER — Telehealth: Payer: Self-pay | Admitting: Internal Medicine

## 2023-03-29 NOTE — Telephone Encounter (Signed)
The patient has been notified of the result along with recommendations. Pt verbalized understanding. All questions (if any) were answered    End, Timothy Deer, MD  Parke Poisson, RN Please let Mr. Coates know that his MRA of the chest shows slight interval enlargement of his thoracic aortic aneurysm (4.6->4.8 cm).  I recommend that we proceed with an echocardiogram in the next month or two and follow-up in the office shortly thereafter to review the results.

## 2023-03-29 NOTE — Telephone Encounter (Signed)
Patient returned call for test results.  °

## 2023-04-06 ENCOUNTER — Other Ambulatory Visit: Payer: Self-pay | Admitting: Physician Assistant

## 2023-04-06 NOTE — Telephone Encounter (Signed)
Refill request

## 2023-04-06 NOTE — Telephone Encounter (Signed)
Prescription refill request for Eliquis received. Indication:afib Last office visit:10/23 Scr:1.24  8/23 Age: 67 Weight:90.5  kg  Prescription refilled

## 2023-04-20 ENCOUNTER — Ambulatory Visit: Payer: Medicare HMO | Attending: Internal Medicine

## 2023-04-20 DIAGNOSIS — I350 Nonrheumatic aortic (valve) stenosis: Secondary | ICD-10-CM | POA: Diagnosis not present

## 2023-04-20 DIAGNOSIS — I77810 Thoracic aortic ectasia: Secondary | ICD-10-CM

## 2023-04-20 DIAGNOSIS — I352 Nonrheumatic aortic (valve) stenosis with insufficiency: Secondary | ICD-10-CM | POA: Diagnosis not present

## 2023-04-20 LAB — ECHOCARDIOGRAM COMPLETE
AR max vel: 1.62 cm2
AV Area VTI: 1.58 cm2
AV Area mean vel: 1.6 cm2
AV Mean grad: 16.7 mmHg
AV Peak grad: 27.1 mmHg
Ao pk vel: 2.6 m/s
Area-P 1/2: 3.48 cm2
Calc EF: 52.8 %
P 1/2 time: 490 msec
S' Lateral: 3.6 cm
Single Plane A2C EF: 52.6 %
Single Plane A4C EF: 52.8 %

## 2023-06-02 DIAGNOSIS — Z125 Encounter for screening for malignant neoplasm of prostate: Secondary | ICD-10-CM | POA: Diagnosis not present

## 2023-06-03 ENCOUNTER — Other Ambulatory Visit: Payer: Medicare HMO

## 2023-06-03 DIAGNOSIS — E1169 Type 2 diabetes mellitus with other specified complication: Secondary | ICD-10-CM | POA: Diagnosis not present

## 2023-06-03 DIAGNOSIS — Z0000 Encounter for general adult medical examination without abnormal findings: Secondary | ICD-10-CM | POA: Diagnosis not present

## 2023-06-03 DIAGNOSIS — E785 Hyperlipidemia, unspecified: Secondary | ICD-10-CM | POA: Diagnosis not present

## 2023-06-04 LAB — COMPLETE METABOLIC PANEL WITH GFR
AG Ratio: 1.4 (calc) (ref 1.0–2.5)
ALT: 20 U/L (ref 9–46)
AST: 18 U/L (ref 10–35)
Albumin: 4.2 g/dL (ref 3.6–5.1)
Alkaline phosphatase (APISO): 68 U/L (ref 35–144)
BUN: 21 mg/dL (ref 7–25)
CO2: 23 mmol/L (ref 20–32)
Calcium: 9.5 mg/dL (ref 8.6–10.3)
Chloride: 108 mmol/L (ref 98–110)
Creat: 1.35 mg/dL (ref 0.70–1.35)
Globulin: 2.9 g/dL (ref 1.9–3.7)
Glucose, Bld: 108 mg/dL — ABNORMAL HIGH (ref 65–99)
Potassium: 4.5 mmol/L (ref 3.5–5.3)
Sodium: 141 mmol/L (ref 135–146)
Total Bilirubin: 0.4 mg/dL (ref 0.2–1.2)
Total Protein: 7.1 g/dL (ref 6.1–8.1)
eGFR: 58 mL/min/{1.73_m2} — ABNORMAL LOW (ref >=60)

## 2023-06-04 LAB — LIPID PANEL
Cholesterol: 135 mg/dL (ref <200)
HDL: 40 mg/dL (ref >=40)
LDL Cholesterol (Calc): 74 mg/dL
Non-HDL Cholesterol (Calc): 95 mg/dL (ref <130)
Total CHOL/HDL Ratio: 3.4 (calc) (ref <5.0)
Triglycerides: 126 mg/dL (ref <150)

## 2023-06-04 LAB — CBC WITH DIFFERENTIAL/PLATELET
Absolute Monocytes: 533 {cells}/uL (ref 200–950)
Basophils Absolute: 43 {cells}/uL (ref 0–200)
Basophils Relative: 0.6 %
Eosinophils Absolute: 107 {cells}/uL (ref 15–500)
Eosinophils Relative: 1.5 %
HCT: 50.6 % — ABNORMAL HIGH (ref 38.5–50.0)
Hemoglobin: 16.7 g/dL (ref 13.2–17.1)
Lymphs Abs: 2705 {cells}/uL (ref 850–3900)
MCH: 29.7 pg (ref 27.0–33.0)
MCHC: 33 g/dL (ref 32.0–36.0)
MCV: 90 fL (ref 80.0–100.0)
MPV: 10.3 fL (ref 7.5–12.5)
Monocytes Relative: 7.5 %
Neutro Abs: 3713 {cells}/uL (ref 1500–7800)
Neutrophils Relative %: 52.3 %
Platelets: 184 10*3/uL (ref 140–400)
RBC: 5.62 10*6/uL (ref 4.20–5.80)
RDW: 13.1 % (ref 11.0–15.0)
Total Lymphocyte: 38.1 %
WBC: 7.1 10*3/uL (ref 3.8–10.8)

## 2023-06-04 LAB — MICROALBUMIN / CREATININE URINE RATIO
Creatinine, Urine: 92 mg/dL (ref 20–320)
Microalb Creat Ratio: 8 mg/g{creat} (ref <30)
Microalb, Ur: 0.7 mg/dL

## 2023-06-04 LAB — HEMOGLOBIN A1C
Hgb A1c MFr Bld: 6.9 %{Hb} — ABNORMAL HIGH (ref <5.7)
Mean Plasma Glucose: 151 mg/dL
eAG (mmol/L): 8.4 mmol/L

## 2023-06-06 ENCOUNTER — Ambulatory Visit: Payer: Medicare HMO | Admitting: Family Medicine

## 2023-06-06 ENCOUNTER — Encounter: Payer: Medicare HMO | Admitting: Internal Medicine

## 2023-06-06 ENCOUNTER — Ambulatory Visit: Payer: Medicare HMO | Admitting: Physician Assistant

## 2023-06-06 ENCOUNTER — Encounter: Payer: Self-pay | Admitting: Family Medicine

## 2023-06-06 VITALS — BP 130/78 | HR 67 | Temp 98.3°F | Ht 73.0 in | Wt 205.0 lb

## 2023-06-06 DIAGNOSIS — E1169 Type 2 diabetes mellitus with other specified complication: Secondary | ICD-10-CM

## 2023-06-06 DIAGNOSIS — Z0001 Encounter for general adult medical examination with abnormal findings: Secondary | ICD-10-CM | POA: Diagnosis not present

## 2023-06-06 DIAGNOSIS — Z7984 Long term (current) use of oral hypoglycemic drugs: Secondary | ICD-10-CM

## 2023-06-06 DIAGNOSIS — I10 Essential (primary) hypertension: Secondary | ICD-10-CM | POA: Diagnosis not present

## 2023-06-06 DIAGNOSIS — Z23 Encounter for immunization: Secondary | ICD-10-CM | POA: Diagnosis not present

## 2023-06-06 DIAGNOSIS — E1165 Type 2 diabetes mellitus with hyperglycemia: Secondary | ICD-10-CM | POA: Diagnosis not present

## 2023-06-06 DIAGNOSIS — E785 Hyperlipidemia, unspecified: Secondary | ICD-10-CM | POA: Diagnosis not present

## 2023-06-06 DIAGNOSIS — Z1211 Encounter for screening for malignant neoplasm of colon: Secondary | ICD-10-CM

## 2023-06-06 MED ORDER — ROSUVASTATIN CALCIUM 10 MG PO TABS: 10.0000 mg | ORAL_TABLET | Freq: Every day | ORAL | 1 refills | Status: DC

## 2023-06-06 NOTE — Assessment & Plan Note (Signed)
Chronic. Not at goal <70. Will increase Crestor to 10mg  daily. I recommend consuming a heart healthy diet such as Mediterranean diet or DASH diet with whole grains, fruits, vegetable, fish, lean meats, nuts, and olive oil. Limit sweets and processed foods. I also encourage moderate intensity exercise 150 minutes weekly. This is 3-5 times weekly for 30-50 minutes each session. Goal should be pace of 3 miles/hours, or walking 1.5 miles in 30 minutes. The ASCVD Risk score (Arnett DK, et al., 2019) failed to calculate for the following reasons:   The patient has a prior MI or stroke diagnosis

## 2023-06-06 NOTE — Addendum Note (Signed)
Addended by: Arta Silence on: 06/06/2023 09:47 AM   Modules accepted: Orders
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
## 2023-06-06 NOTE — Progress Notes (Unsigned)
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 aortic valve with mild to moderate stenosis, primary cardiologist recommends referral to CVTS to establish care and to assist with ongoing monitoring.  Order placed for MRA of the chest in 08/2023.  When/if surgical intervention is indicated for his thoracic aortic aneurysm, would need to pursue cardiac cath prior to exclude significant CAD.  Precautions discussed.  HTN: Blood pressure is mildly elevated in the office today, though typically well-controlled at home and at times on the soft side.  He also has history of orthostatic lightheadedness.  In this setting, we deferred escalation of pharmacotherapy with recommendation to continue current dose Toprol-XL.  If he has further progression of first-degree AV block, would need to consider tapering dose of metoprolol.  HLD: LDL 74 in 05/2023.  Now on  recently titrated dose of rosuvastatin to 10 mg.  Look to follow-up fasting lipid panel and LFT in 2 months.    Disposition: F/u with Dr. Okey Dupre or an APP in 4 months.   Medication Adjustments/Labs and Tests Ordered: Current medicines are reviewed at length with the patient today.  Concerns regarding medicines are outlined above. Medication changes, Labs and Tests ordered today are summarized above and listed in the Patient Instructions accessible in Encounters.   Signed, Eula Listen, PA-C 06/07/2023 1:54 PM     [facility] [place] [address] [place], [place] [address] [phone]

## 2023-06-06 NOTE — Assessment & Plan Note (Addendum)
Chronic. Well controlled on Metoprolol 25mg  daily. Home readings <130/80. Followed by Cardiology and sees them tomorrow. Known aortic aneurysm. Recommend heart healthy diet such as Mediterranean diet with whole grains, fruits, vegetable, fish, lean meats, nuts, and olive oil. Limit salt. Encouraged moderate walking, 3-5 times/week for 30-50 minutes each session. Aim for at least 150 minutes.week. Goal should be pace of 3 miles/hours, or walking 1.5 miles in 30 minutes. Avoid tobacco products. Avoid excess alcohol. Take medications as prescribed and bring medications and blood pressure log with cuff to each office visit. Seek medical care for chest pain, palpitations, shortness of breath with exertion, dizziness/lightheadedness, vision changes, recurrent headaches, or swelling of extremities.

## 2023-06-06 NOTE — Assessment & Plan Note (Signed)
A1c 6.9% at goal. FBG <120. Well controlled on Invokana 300mg  daily, Amaryl 1mg  daily, and Metformin 500mg  daily. Continue carb conscious diet and 150 minutes of exercise weekly. Foot exam, uACR, eye exam, vaccines UTD. Flu vaccine today. Followed by Endocrinology.

## 2023-06-07 ENCOUNTER — Ambulatory Visit: Payer: Medicare HMO | Attending: Physician Assistant | Admitting: Physician Assistant

## 2023-06-07 ENCOUNTER — Encounter: Payer: Self-pay | Admitting: Physician Assistant

## 2023-06-07 VITALS — BP 130/80 | HR 72 | Ht 72.0 in | Wt 205.6 lb

## 2023-06-07 DIAGNOSIS — I480 Paroxysmal atrial fibrillation: Secondary | ICD-10-CM | POA: Diagnosis not present

## 2023-06-07 DIAGNOSIS — I350 Nonrheumatic aortic (valve) stenosis: Secondary | ICD-10-CM | POA: Diagnosis not present

## 2023-06-07 DIAGNOSIS — I7121 Aneurysm of the ascending aorta, without rupture: Secondary | ICD-10-CM | POA: Diagnosis not present

## 2023-06-07 DIAGNOSIS — Q2381 Bicuspid aortic valve: Secondary | ICD-10-CM | POA: Diagnosis not present

## 2023-06-07 DIAGNOSIS — I700 Atherosclerosis of aorta: Secondary | ICD-10-CM | POA: Diagnosis not present

## 2023-06-07 DIAGNOSIS — I10 Essential (primary) hypertension: Secondary | ICD-10-CM

## 2023-06-07 DIAGNOSIS — E785 Hyperlipidemia, unspecified: Secondary | ICD-10-CM | POA: Diagnosis not present

## 2023-06-07 NOTE — Patient Instructions (Signed)
Medication Instructions:  Your Physician recommend you continue on your current medication as directed.    *If you need a refill on your cardiac medications before your next appointment, please call your pharmacy*   Lab Work: Your provider would like for you to return in early January to have the following labs drawn: BMP.   Please go to [facility] [address] ([facility] [address], [place] [address] You do not need an appointment.  They are open from 7:30 am-4 pm.  Lunch from 1:00 pm- 2:00 pm You NOT need to be fasting.   You may also go to any of these [facility] locations:  [employer]  - [address] [employer] [address] Chief Technology Officer)     If you have labs (blood work) drawn today and your tests are completely normal, you will receive your results only by: [employer] (if you have MyChart) OR A paper copy in the mail If you have any lab test that is abnormal or we need to change your treatment, we will call you to review the results.   Testing/Procedures: Your physician has requested that you have a MRA. Cardiac MRA uses a computer to create images of your blood vessels, producing both still and moving pictures of your heart and major blood vessels. For further information please visit [email or web address].   This will be done with and without contrast.   Follow-Up: At [facility], you and your health needs are our priority.  As part of our continuing mission to provide you with exceptional heart care, we have created designated Provider Care Teams.  These Care Teams include your primary Cardiologist (physician) and Advanced Practice Providers (APPs -  Physician Assistants and Nurse Practitioners) who all work together to provide you with the care you need, when you need it.  We recommend signing up for the patient portal called "MyChart".  Sign up information is provided on this After Visit Summary.  MyChart is used to connect with  patients for Virtual Visits (Telemedicine).  Patients are able to view lab/test results, encounter notes, upcoming appointments, etc.  Non-urgent messages can be sent to your provider as well.   To learn more about what you can do with MyChart, go to [email or web address].    Your next appointment:   After your MRA in January 2025   Provider:   You may see [name], MD or one of the following Advanced Practice Providers on your designated Care Team:   Nicolasa Ducking, NP Eula Listen, PA-C Cadence Fransico Michael, PA-C Charlsie Quest, NP

## 2023-06-09 DIAGNOSIS — N5201 Erectile dysfunction due to arterial insufficiency: Secondary | ICD-10-CM | POA: Diagnosis not present

## 2023-06-09 DIAGNOSIS — N183 Chronic kidney disease, stage 3 unspecified: Secondary | ICD-10-CM | POA: Diagnosis not present

## 2023-06-09 DIAGNOSIS — Z125 Encounter for screening for malignant neoplasm of prostate: Secondary | ICD-10-CM | POA: Diagnosis not present

## 2023-06-16 ENCOUNTER — Ambulatory Visit: Payer: Medicare HMO

## 2023-06-16 VITALS — Ht 72.0 in | Wt 205.0 lb

## 2023-06-16 DIAGNOSIS — Z0000 Encounter for general adult medical examination without abnormal findings: Secondary | ICD-10-CM

## 2023-06-16 NOTE — Patient Instructions (Signed)
Timothy Bernard , Thank you for taking time to come for your Medicare Wellness Visit. I appreciate your ongoing commitment to your health goals. Please review the following plan we discussed and let me know if I can assist you in the future.   Referrals/Orders/Follow-Ups/Clinician Recommendations: Aim for 30 minutes of exercise or brisk walking, 6-8 glasses of water, and 5 servings of fruits and vegetables each day.  This is a list of the screening recommended for you and due dates:  Health Maintenance  Topic Date Due   Colon Cancer Screening  11/29/2022   Hepatitis C Screening  06/05/2024*   Eye exam for diabetics  11/17/2023   Hemoglobin A1C  12/02/2023   Complete foot exam   02/10/2024   DTaP/Tdap/Td vaccine (3 - Td or Tdap) 02/20/2024   Yearly kidney function blood test for diabetes  06/02/2024   Yearly kidney health urinalysis for diabetes  06/02/2024   Medicare Annual Wellness Visit  06/15/2024   Pneumonia Vaccine  Completed   Flu Shot  Completed   Zoster (Shingles) Vaccine  Completed   HPV Vaccine  Aged Out   COVID-19 Vaccine  Discontinued  *Topic was postponed. The date shown is not the original due date.    Advanced directives: (ACP Link)Information on Advanced Care Planning can be found at [facility] Advance Health Care Directives Advance Health Care Directives ([email or web address]   Next Medicare Annual Wellness Visit scheduled for next year: Yes

## 2023-06-16 NOTE — Progress Notes (Signed)
Subjective:   Timothy Bernard is a 67 y.o. male who presents for Medicare Annual/Subsequent preventive examination.  Visit Complete: Virtual I connected with  Timothy Bernard on 06/16/23 by a audio enabled telemedicine application and verified that I am speaking with the correct person using two identifiers.  Patient Location: Home  Provider Location: Home Office  I discussed the limitations of evaluation and management by telemedicine. The patient expressed understanding and agreed to proceed.  Vital Signs: Because this visit was a virtual/telehealth visit, some criteria may be missing or patient reported. Any vitals not documented were not able to be obtained and vitals that have been documented are patient reported.  Patient Medicare AWV questionnaire was completed by the patient on 06/10/23; I have confirmed that all information answered by patient is correct and no changes since this date.  Cardiac Risk Factors include: advanced age (>66men, >48 women);male gender;dyslipidemia;diabetes mellitus;hypertension     Objective:    Today's Vitals   06/16/23 1341  Weight: 205 lb (93 kg)  Height: 6' (1.829 m)   Body mass index is 27.8 kg/m.     06/16/2023    1:44 PM 02/09/2022    5:00 PM 02/09/2022   12:30 PM  Advanced Directives  Does Patient Have a Medical Advance Directive? No No No  Would patient like information on creating a medical advance directive? Yes (MAU/Ambulatory/Procedural Areas - Information given) No - Patient declined No - Patient declined    Current Medications (verified) Outpatient Encounter Medications as of 06/16/2023  Medication Sig   canagliflozin (INVOKANA) 300 MG TABS tablet TAKE 1 TABLET EVERY DAY BEFORE BREAKFAST   ELIQUIS 5 MG TABS tablet TAKE 1 TABLET BY MOUTH TWICE A DAY   Flaxseed, Linseed, (FLAX SEED OIL) 1000 MG CAPS Take 1 capsule by mouth daily.   glimepiride (AMARYL) 1 MG tablet Take 1 tablet (1 mg total) by mouth daily before breakfast.    glucose blood (TRUE METRIX BLOOD GLUCOSE TEST) test strip TEST BLOOD SUGAR ONE TIME DAILY   metFORMIN (GLUCOPHAGE-XR) 500 MG 24 hr tablet Take 1 tablet (500 mg total) by mouth daily in the afternoon.   metoprolol succinate (TOPROL-XL) 25 MG 24 hr tablet Take 25 mg by mouth daily.   rosuvastatin (CRESTOR) 10 MG tablet Take 1 tablet (10 mg total) by mouth daily.   No facility-administered encounter medications on file as of 06/16/2023.    Allergies (verified) Patient has no known allergies.   History: Past Medical History:  Diagnosis Date   Adenomatous colon polyp 09/29/2007   12mm cecal adenomatous polyp 10 mm adenoama, post-polypectomy burn syndrome   Ascending aortic aneurysm (HCC)    4.8 cm on ech (5/10), 4.6 x 4.6 cm by MRA 6/10 4.6 cm aortic root and ascending aorta by MRA 9/10. MRA chest (3/11) 4.5 ascending aorta. Stable by MRA 6/18   Bicuspid aortic valve    echo (5/10) with bicuspid AoV (upon re-review), mild LV dilation, EF 50-55%, no aortic stenosis, mild AI, moderate ascending aortic dilation (4.8 cm). 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 Echo (3/11) bicuspid aortic valve with no AS but mild AI, mild LVH, EF 60%.     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   OSA on CPAP    Paroxysmal atrial fibrillation (HCC) 02/2022   TIA (transient ischemic attack) 01/2022   Past  Surgical History:  Procedure Laterality Date   COLONOSCOPY W/ BIOPSIES AND POLYPECTOMY  09/29/2007   Mole removed     Family History  Problem Relation Age of Onset   Hypertension Mother    Diabetes Mother    Cancer Father    Coronary artery disease Maternal Grandfather    Hypertension Other        maternal side   Colon cancer Neg Hx    Prostate cancer Neg Hx    Esophageal cancer Neg Hx    Liver cancer Neg Hx    Pancreatic cancer Neg Hx    Stomach cancer  Neg Hx    Rectal cancer Neg Hx    Social History   Socioeconomic History   Marital status: Married    Spouse name: Not on file   Number of children: Not on file   Years of education: Not on file   Highest education level: Bachelor's degree (e.g., BA, AB, BS)  Occupational History   Occupation: Oceanographer: [employer]  Tobacco Use   Smoking status: Never    Passive exposure: Past (as a child)   Smokeless tobacco: Never  Vaping Use   Vaping status: Never Used  Substance and Sexual Activity   Alcohol use: No    Alcohol/week: 0.0 standard drinks of alcohol   Drug use: No   Sexual activity: Not on file  Other Topics Concern   Not on file  Social History Narrative   Comptroller.    Married, 1 stepson      No living will   Wants wife to make decisions if he is not able--stepson is alternate   Would want resuscitation attempts   Not sure about tube feeds       Pt signed designated party release form and gives Devereaux Grayson, spouse [phone], access to medical records. Can leave msg on answering machine.    Social Determinants of Health   Financial Resource Strain: Low Risk  (06/10/2023)   Overall Financial Resource Strain (CARDIA)    Difficulty of Paying Living Expenses: Not hard at all  Food Insecurity: No Food Insecurity (06/10/2023)   Hunger Vital Sign    Worried About Running Out of Food in the Last Year: Never true    Ran Out of Food in the Last Year: Never true  Transportation Needs: No Transportation Needs (06/10/2023)   PRAPARE - Administrator, Civil Service (Medical): No    Lack of Transportation (Non-Medical): No  Physical Activity: Sufficiently Active (06/10/2023)   Exercise Vital Sign    Days of Exercise per Week: 5 days    Minutes of Exercise per Session: 40 min  Stress: No Stress Concern Present (06/10/2023)   [employer] of Occupational Health - Occupational Stress Questionnaire    Feeling of Stress : Not  at all  Social Connections: Socially Integrated (06/10/2023)   Social Connection and Isolation Panel [NHANES]    Frequency of Communication with Friends and Family: More than three times a week    Frequency of Social Gatherings with Friends and Family: Three times a week    Attends Religious Services: More than 4 times per year    Active Member of Clubs or Organizations: Yes    Attends Banker Meetings: More than 4 times per year    Marital Status: Married    Tobacco Counseling Counseling given: Not Answered   Clinical Intake:  Pre-visit preparation completed: Yes  Pain : No/denies pain  Diabetes: No  How often do you need to have someone help you when you read instructions, pamphlets, or other written materials from your doctor or pharmacy?: 1 - Never  Interpreter Needed?: No  Information entered by :: Kandis Fantasia LPN   Activities of Daily Living    06/10/2023    3:54 PM  In your present state of health, do you have any difficulty performing the following activities:  Hearing? 0  Vision? 0  Difficulty concentrating or making decisions? 0  Walking or climbing stairs? 0  Dressing or bathing? 0  Doing errands, shopping? 0  Preparing Food and eating ? N  Using the Toilet? N  In the past six months, have you accidently leaked urine? N  Do you have problems with loss of bowel control? N  Managing your Medications? N  Managing your Finances? N  Housekeeping or managing your Housekeeping? N    Patient Care Team: Park Meo, FNP as PCP - General (Family Medicine) End, Cristal Deer, MD as PCP - Cardiology (Cardiology)  Indicate any recent Medical Services you may have received from other than [facility] providers in the past year (date may be approximate).     Assessment:   This is a routine wellness examination for Johntae.  Hearing/Vision screen Hearing Screening - Comments:: Denies hearing difficulties   Vision Screening - Comments:: Wears rx  glasses - up to date with routine eye exams     Goals Addressed             This Visit's Progress    Remain active and independent        Depression Screen    06/16/2023    1:43 PM 10/27/2022    8:36 AM 06/02/2022    8:51 AM 05/29/2021    7:32 AM 03/14/2020    8:29 AM 03/14/2019    9:09 AM 12/20/2018    9:22 AM  PHQ 2/9 Scores  PHQ - 2 Score 0 0 0 0 0 0 0  PHQ- 9 Score  0         Fall Risk    06/10/2023    3:54 PM 10/27/2022    8:36 AM 06/02/2022    8:51 AM 12/20/2018    9:22 AM  Fall Risk   Falls in the past year? 0 0 0 0  Number falls in past yr: 0 0  0  Injury with Fall? 0 0  0  Risk for fall due to : No Fall Risks     Follow up Falls evaluation completed;Education provided;Falls prevention discussed       MEDICARE RISK AT HOME: Medicare Risk at Home Any stairs in or around the home?: Yes If so, are there any without handrails?: No Home free of loose throw rugs in walkways, pet beds, electrical cords, etc?: No Adequate lighting in your home to reduce risk of falls?: Yes Life alert?: No Use of a cane, walker or w/c?: No Grab bars in the bathroom?: No Shower chair or bench in shower?: No Elevated toilet seat or a handicapped toilet?: Yes  TIMED UP AND GO:  Was the test performed?  No    Cognitive Function:        06/16/2023    1:44 PM  6CIT Screen  What Year? 0 points  What month? 0 points  What time? 0 points  Count back from 20 0 points  Months in reverse 0 points  Repeat phrase 0 points  Total Score 0 points  Immunizations Immunization History  Administered Date(s) Administered   Fluad Quad(high Dose 65+) 06/02/2022   Fluad Trivalent(High Dose 65+) 06/06/2023   Influenza Split 06/30/2011, 07/14/2012, 06/18/2013   Influenza Whole 05/30/2009   Influenza, Seasonal, Injecte, Preservative Fre 06/29/2016   Influenza,inj,Quad PF,6+ Mos 08/03/2013, 05/24/2019, 05/10/2020, 05/29/2021   Influenza-Unspecified 06/08/2014, 05/30/2017, 05/30/2018    PFIZER(Purple Top)SARS-COV-2 Vaccination 11/09/2019, 12/03/2019, 06/28/2020   Pneumococcal Conjugate-13 09/22/2016   Pneumococcal Polysaccharide-23 07/14/2012, 05/29/2021   Td 03/07/2003   Tdap 02/19/2014   Zoster Recombinant(Shingrix) 03/27/2019, 07/14/2019    TDAP status: Up to date  Flu Vaccine status: Up to date  Pneumococcal vaccine status: Up to date  Covid-19 vaccine status: Information provided on how to obtain vaccines.   Qualifies for Shingles Vaccine? Yes   Zostavax completed No   Shingrix Completed?: Yes  Screening Tests Health Maintenance  Topic Date Due   Colonoscopy  11/29/2022   Hepatitis C Screening  06/05/2024 (Originally 01/14/1974)   OPHTHALMOLOGY EXAM  11/17/2023   HEMOGLOBIN A1C  12/02/2023   FOOT EXAM  02/10/2024   DTaP/Tdap/Td (3 - Td or Tdap) 02/20/2024   Diabetic kidney evaluation - eGFR measurement  06/02/2024   Diabetic kidney evaluation - Urine ACR  06/02/2024   Medicare Annual Wellness (AWV)  06/15/2024   Pneumonia Vaccine 84+ Years old  Completed   INFLUENZA VACCINE  Completed   Zoster Vaccines- Shingrix  Completed   HPV VACCINES  Aged Out   COVID-19 Vaccine  Discontinued    Health Maintenance  Health Maintenance Due  Topic Date Due   Colonoscopy  11/29/2022    Colorectal cancer screening: Type of screening: Colonoscopy. Completed 11/28/17. Repeat every 5 years (provided with number for Dr. Leone Payor)   Lung Cancer Screening: (Low Dose CT Chest recommended if Age 45-80 years, 20 pack-year currently smoking OR have quit w/in 15years.) does not qualify.   Lung Cancer Screening Referral: n/a  Additional Screening:  Hepatitis C Screening: does not qualify  Vision Screening: Recommended annual ophthalmology exams for early detection of glaucoma and other disorders of the eye. Is the patient up to date with their annual eye exam?  Yes  Who is the provider or what is the name of the office in which the patient attends annual eye exams?  Unknown provider If pt is not established with a provider, would they like to be referred to a provider to establish care? No .   Dental Screening: Recommended annual dental exams for proper oral hygiene  Diabetic Foot Exam: Diabetic Foot Exam: Completed 02/10/23  Community Resource Referral / Chronic Care Management: CRR required this visit?  No   CCM required this visit?  No     Plan:     I have personally reviewed and noted the following in the patient's chart:   Medical and social history Use of alcohol, tobacco or illicit drugs  Current medications and supplements including opioid prescriptions. Patient is not currently taking opioid prescriptions. Functional ability and status Nutritional status Physical activity Advanced directives List of other physicians Hospitalizations, surgeries, and ER visits in previous 12 months Vitals Screenings to include cognitive, depression, and falls Referrals and appointments  In addition, I have reviewed and discussed with patient certain preventive protocols, quality metrics, and best practice recommendations. A written personalized care plan for preventive services as well as general preventive health recommendations were provided to patient.     Kandis Fantasia [place], [place]   [id number]   After Visit Summary: (MyChart) Due to this being a telephonic  visit, the after visit summary with patients personalized plan was offered to patient via MyChart   Nurse Notes: No concerns at this time

## 2023-06-21 ENCOUNTER — Ambulatory Visit: Admission: RE | Admit: 2023-06-21 | Payer: Medicare HMO | Source: Ambulatory Visit

## 2023-06-22 ENCOUNTER — Institutional Professional Consult (permissible substitution): Payer: Medicare HMO | Admitting: Surgical

## 2023-06-22 VITALS — BP 147/78 | HR 72 | Resp 20 | Ht 72.0 in | Wt 205.0 lb

## 2023-06-22 DIAGNOSIS — I7121 Aneurysm of the ascending aorta, without rupture: Secondary | ICD-10-CM
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
## 2023-06-22 NOTE — Patient Instructions (Signed)
No heavy lifting.  Good control of blood pressure and chronic medical conditions.

## 2023-06-29 ENCOUNTER — Ambulatory Visit: Payer: Medicare HMO

## 2023-07-01 ENCOUNTER — Ambulatory Visit: Payer: Medicare HMO

## 2023-07-01 DIAGNOSIS — E1159 Type 2 diabetes mellitus with other circulatory complications: Secondary | ICD-10-CM

## 2023-07-01 DIAGNOSIS — E119 Type 2 diabetes mellitus without complications: Secondary | ICD-10-CM

## 2023-07-01 LAB — HM DIABETES EYE EXAM

## 2023-07-01 NOTE — Progress Notes (Signed)
Timothy Bernard arrived 07/01/2023 and has given verbal consent to obtain images and complete their overdue diabetic retinal screening.  The images have been sent to an ophthalmologist or optometrist for review and interpretation.  Results will be sent back to Park Meo, FNP for review.  Patient has been informed they will be contacted when we receive the results via telephone or MyChart

## 2023-07-05 NOTE — Progress Notes (Signed)
Received results no diabetic retinopathy was detective. I have printed the report and placed in providers folder.

## 2023-07-27 ENCOUNTER — Encounter: Payer: Self-pay | Admitting: Family Medicine

## 2023-08-17 ENCOUNTER — Ambulatory Visit: Payer: Medicare HMO | Admitting: Internal Medicine

## 2023-08-17 ENCOUNTER — Encounter: Payer: Self-pay | Admitting: Internal Medicine

## 2023-08-17 VITALS — BP 132/82 | HR 72 | Ht 72.0 in | Wt 205.0 lb

## 2023-08-17 DIAGNOSIS — Z7984 Long term (current) use of oral hypoglycemic drugs: Secondary | ICD-10-CM | POA: Diagnosis not present

## 2023-08-17 DIAGNOSIS — E1165 Type 2 diabetes mellitus with hyperglycemia: Secondary | ICD-10-CM | POA: Diagnosis not present

## 2023-08-17 LAB — POCT GLYCOSYLATED HEMOGLOBIN (HGB A1C): Hemoglobin A1C: 6.4 % — AB (ref 4.0–5.6)

## 2023-08-17 MED ORDER — CANAGLIFLOZIN 300 MG PO TABS: 300.0000 mg | ORAL_TABLET | Freq: Every day | ORAL | 3 refills | Status: DC

## 2023-08-17 MED ORDER — METFORMIN HCL ER 500 MG PO TB24: 500.0000 mg | ORAL_TABLET | Freq: Every day | ORAL | 3 refills | Status: DC

## 2023-08-17 MED ORDER — GLIMEPIRIDE 1 MG PO TABS: 1.0000 mg | ORAL_TABLET | Freq: Every day | ORAL | 3 refills | Status: DC
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
## 2023-08-17 NOTE — Patient Instructions (Signed)
Continue Glimepiride 1 mg , 1 tablet before Breakfast  Continue Invokana 300 mg daily  Continue Metformin 500 mg, 1 tablet daily     HOW TO TREAT LOW BLOOD SUGARS (Blood sugar LESS THAN 70 MG/DL) Please follow the RULE OF 15 for the treatment of hypoglycemia treatment (when your (blood sugars are less than 70 mg/dL)   STEP 1: Take 15 grams of carbohydrates when your blood sugar is low, which includes:  3-4 GLUCOSE TABS  OR 3-4 OZ OF JUICE OR REGULAR SODA OR ONE TUBE OF GLUCOSE GEL    STEP 2: RECHECK blood sugar in 15 MINUTES STEP 3: If your blood sugar is still low at the 15 minute recheck --> then, go back to STEP 1 and treat AGAIN with another 15 grams of carbohydrates.

## 2023-09-07 ENCOUNTER — Ambulatory Visit
Admission: RE | Admit: 2023-09-07 | Discharge: 2023-09-07 | Disposition: A | Discharge status: Home / Self Care | Payer: Medicare HMO | Source: Ambulatory Visit | Attending: Physician Assistant | Admitting: Physician Assistant

## 2023-09-07 DIAGNOSIS — Q2381 Bicuspid aortic valve: Secondary | ICD-10-CM | POA: Diagnosis not present

## 2023-09-07 DIAGNOSIS — I7121 Aneurysm of the ascending aorta, without rupture: Secondary | ICD-10-CM | POA: Diagnosis not present

## 2023-09-07 MED ORDER — GADOBUTROL 1 MMOL/ML IV SOLN
9.0000 mL | Freq: Once | INTRAVENOUS | Status: AC | PRN
  Administered 2023-09-07: 9 mL via INTRAVENOUS

## 2023-09-12 ENCOUNTER — Other Ambulatory Visit: Payer: Self-pay | Admitting: Emergency Medicine

## 2023-09-14 ENCOUNTER — Ambulatory Visit: Payer: Medicare HMO | Admitting: Surgery

## 2023-09-21 ENCOUNTER — Encounter: Payer: Self-pay | Admitting: Surgery

## 2023-09-21 ENCOUNTER — Ambulatory Visit: Payer: Medicare HMO | Admitting: Surgery

## 2023-09-21 VITALS — BP 151/84 | HR 70 | Resp 20 | Ht 72.0 in | Wt 206.0 lb

## 2023-09-21 DIAGNOSIS — I7121 Aneurysm of the ascending aorta, without rupture: Secondary | ICD-10-CM

## 2023-09-21 NOTE — Progress Notes (Signed)
HPI:  The patient is a 68 year old gentleman who was seen in our office in October 2024 by one of the PAs for evaluation of a 4.8 cm fusiform ascending aortic aneurysm with a bicuspid aortic valve.  He had been followed by cardiology since 2010 with MRA.  Reviewing his scans dating back to 2010 showed that the aneurysm measured 4.6 x 4.6 cm at that time.  It has varied in measurement from 4.5 to 4.7 cm.  His last scan in July 2024 measured it at 4.8 cm which is the same as his scan on 09/07/2023.  His most recent echo on 04/20/2023 showed a bicuspid aortic valve with fusion of the left and noncoronary cusps.  The valve is heavily calcified with mean gradient of 16.7 mmHg and a peak of 27 mmHg.  Valve area by VTI was 1.58 cm.  There is mild insufficiency with a pressure half-time of 490 ms.  Left ventricular ejection fraction was 55 to 60%.  The aortic root was measured at 4.2 cm and the ascending aorta at 4.7 cm on that echo.  The patient continues to feel well without any chest pain or shortness of breath.  Denies any exertional fatigue.  He has had no dizziness or syncope and no peripheral edema.  Current Outpatient Medications  Medication Sig Dispense Refill   canagliflozin (INVOKANA) 300 MG TABS tablet Take 1 tablet (300 mg total) by mouth daily before breakfast. 90 tablet 3   ELIQUIS 5 MG TABS tablet TAKE 1 TABLET BY MOUTH TWICE A DAY 60 tablet 11   Flaxseed, Linseed, (FLAX SEED OIL) 1000 MG CAPS Take 1 capsule by mouth daily.     glimepiride (AMARYL) 1 MG tablet Take 1 tablet (1 mg total) by mouth daily before breakfast. 90 tablet 3   glucose blood (TRUE METRIX BLOOD GLUCOSE TEST) test strip TEST BLOOD SUGAR ONE TIME DAILY 100 strip 3   metFORMIN (GLUCOPHAGE-XR) 500 MG 24 hr tablet Take 1 tablet (500 mg total) by mouth daily in the afternoon. 90 tablet 3   metoprolol succinate (TOPROL-XL) 25 MG 24 hr tablet Take 25 mg by mouth daily.     rosuvastatin (CRESTOR) 10 MG tablet Take 1 tablet (10  mg total) by mouth daily. 90 tablet 1   No current facility-administered medications for this visit.     Physical Exam: BP (!) 151/84   Pulse 70   Resp 20   Ht 6' (1.829 m)   Wt 206 lb (93.4 kg)   SpO2 98% Comment: RA  BMI 27.94 kg/m  He looks well. Cardiac exam shows a regular rate and rhythm with a 2/6 systolic murmur along the right sternal border.  There is no diastolic murmur. Lungs are clear. There is no peripheral edema.  Diagnostic Tests:  Narrative & Impression  CLINICAL DATA:  Aortic aneurysm suspected, bicuspid aortic valve   EXAM: MRA CHEST WITH OR WITHOUT CONTRAST   TECHNIQUE: Angiographic images of the chest were obtained using MRA technique without and with intravenous contrast.   CONTRAST:  9 mL GADAVIST GADOBUTROL 1 MMOL/ML IV SOLN   COMPARISON:  Prior MRA chest 03/24/2023   FINDINGS: VASCULAR   Aorta: Fusiform aneurysmal dilation of the ascending thoracic aorta to a maximal diameter of 4.8 cm which is unchanged compared to prior imaging. No evidence of dissection. Conventional 3 vessel arch anatomy. Normal caliber of the transverse and descending thoracic aorta.   Heart: The heart is normal in size.  No pericardial effusion  Pulmonary Arteries:  Pulmonary arteries are normal in size.   Other: None.   NON-VASCULAR   No focal signal abnormality or abnormal enhancement within the visualized lungs, mediastinum, upper abdomen or musculoskeletal soft tissues.   IMPRESSION: Stable aneurysmal dilation of the ascending thoracic aorta with a maximal diameter of 4.8 cm. Ascending thoracic aortic aneurysm. Recommend semi-annual imaging followup by CTA or MRA and referral to cardiothoracic surgery if not already obtained. This recommendation follows 2010 ACCF/AHA/AATS/ACR/ASA/SCA/SCAI/SIR/STS/SVM Guidelines for the Diagnosis and Management of Patients With Thoracic Aortic Disease. Circulation. 2010; 121: Z610-R604. Aortic aneurysm  NOS (ICD10-I71.9)     Electronically Signed   By: Malachy Moan M.D.   On: 09/10/2023 08:32      Impression:  This 68 year old gentleman has a bicuspid aortic valve that is calcified with mild aortic stenosis and mild aortic insufficiency.  He also has a 4.8 cm fusiform ascending aortic aneurysm with mild aortic root dilation at 4.2 cm by echo.  These measurements have been relatively stable with minimal change dating back to 2010.  His mild aortic stenosis and insufficiency is still well below the surgical threshold and remains asymptomatic.  The ascending aortic aneurysm is still below the surgical threshold of 5 cm and has been stable for many years.  I reviewed the echo and CT images with the patient and his wife and answered all their questions.  I have recommended continued follow-up with a repeat MRA in 1 year with follow-up in my office.  I also recommended repeating his echocardiogram yearly which Dr. Okey Dupre will order when he sees him next.  The last echo was in August 2024 and he should have another 1 in August 2025.  Plan:  I will see him back in 1 year with an MRA of the chest.  He will continue to follow-up with Dr. Okey Dupre concerning his bicuspid aortic stenosis and insufficiency and should have a follow-up echo in August 2025.  I spent 20 minutes performing this established patient evaluation and > 50% of this time was spent face to face counseling and coordinating the care of this patient's aortic aneurysm and bicuspid aortic valve with mild stenosis and insufficiency.   Alleen Borne, MD [facility] [phone]

## 2023-09-26 ENCOUNTER — Other Ambulatory Visit: Payer: Self-pay | Admitting: Internal Medicine

## 2023-10-16 ENCOUNTER — Other Ambulatory Visit: Payer: Self-pay | Admitting: Internal Medicine

## 2023-10-21 ENCOUNTER — Other Ambulatory Visit: Payer: Self-pay

## 2023-10-21 ENCOUNTER — Telehealth: Payer: Self-pay | Admitting: Family Medicine

## 2023-10-21 DIAGNOSIS — E1159 Type 2 diabetes mellitus with other circulatory complications: Secondary | ICD-10-CM

## 2023-10-21 DIAGNOSIS — E1169 Type 2 diabetes mellitus with other specified complication: Secondary | ICD-10-CM

## 2023-10-21 DIAGNOSIS — I480 Paroxysmal atrial fibrillation: Secondary | ICD-10-CM

## 2023-10-21 MED ORDER — METOPROLOL SUCCINATE ER 25 MG PO TB24: 25.0000 mg | ORAL_TABLET | Freq: Every day | ORAL | 3 refills | Status: DC

## 2023-10-21 MED ORDER — APIXABAN 5 MG PO TABS: 5.0000 mg | ORAL_TABLET | Freq: Two times a day (BID) | ORAL | 1 refills | Status: DC

## 2023-10-21 MED ORDER — ROSUVASTATIN CALCIUM 10 MG PO TABS: 10.0000 mg | ORAL_TABLET | Freq: Every day | ORAL | 1 refills | Status: DC

## 2023-10-21 NOTE — Telephone Encounter (Signed)
Prescription Request  10/21/2023  LOV: 06/06/2023  What is the name of the medication or equipment?   rosuvastatin (CRESTOR) 10 MG tablet  **new script requested**  Have you contacted your pharmacy to request a refill? Yes   Which pharmacy would you like this sent to?  [facility] - [place], [place] - [address] [place] [address] Phone: [phone] Fax: [phone]    Patient notified that their request is being sent to the clinical staff for review and that they should receive a response within 2 business days.   Please advise pharmacist.

## 2023-10-21 NOTE — Telephone Encounter (Signed)
Prescription refill request for Eliquis received. Indication: Afib  Last office visit: 06/07/23 ([place])  Scr: 1.35 (06/03/23)  Age: 68 Weight: 93.4kg  Appropriate dose. Refill sent.

## 2023-10-21 NOTE — Telephone Encounter (Signed)
Pt's pharmacy [facility] is requesting a refill on medication metoprolol. Pt's PCP has been refilling this medication. Would Eula Listen, PA like to refill this medication, because he saw pt last? Please address

## 2023-10-24 ENCOUNTER — Other Ambulatory Visit: Payer: Self-pay

## 2023-10-24 MED ORDER — GLIMEPIRIDE 1 MG PO TABS: 1.0000 mg | ORAL_TABLET | Freq: Every day | ORAL | 3 refills | Status: DC

## 2023-11-03 ENCOUNTER — Other Ambulatory Visit: Payer: Self-pay | Admitting: Family Medicine

## 2023-11-03 DIAGNOSIS — E785 Hyperlipidemia, unspecified: Secondary | ICD-10-CM

## 2023-11-03 DIAGNOSIS — E1159 Type 2 diabetes mellitus with other circulatory complications: Secondary | ICD-10-CM

## 2023-11-24 ENCOUNTER — Encounter: Payer: Self-pay | Admitting: Family Medicine

## 2023-11-24 ENCOUNTER — Other Ambulatory Visit: Payer: Self-pay | Admitting: Family Medicine

## 2023-11-24 DIAGNOSIS — H3500 Unspecified background retinopathy: Secondary | ICD-10-CM

## 2023-11-24 DIAGNOSIS — H26493 Other secondary cataract, bilateral: Secondary | ICD-10-CM | POA: Diagnosis not present

## 2023-11-24 DIAGNOSIS — H35033 Hypertensive retinopathy, bilateral: Secondary | ICD-10-CM | POA: Diagnosis not present

## 2023-11-24 DIAGNOSIS — Z961 Presence of intraocular lens: Secondary | ICD-10-CM | POA: Diagnosis not present

## 2023-11-24 DIAGNOSIS — E113211 Type 2 diabetes mellitus with mild nonproliferative diabetic retinopathy with macular edema, right eye: Secondary | ICD-10-CM | POA: Diagnosis not present

## 2023-11-24 DIAGNOSIS — H04123 Dry eye syndrome of bilateral lacrimal glands: Secondary | ICD-10-CM | POA: Diagnosis not present

## 2023-11-24 LAB — HM DIABETES EYE EXAM

## 2023-11-28 NOTE — Progress Notes (Unsigned)
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 clubbing.  Neurological:     Mental Status: He is alert and oriented to person, place, and time.  Psychiatric:        Speech: Speech normal.        Behavior: Behavior normal.        Thought Content: Thought content normal.        Judgment: Judgment normal.     Wt Readings from Last 3 Encounters:  12/01/23 208 lb (94.3 kg)  09/21/23 206 lb (93.4 kg)  08/17/23 205 lb (93 kg)     ASSESSMENT & PLAN:   PAF: Maintaining sinus rhythm without symptoms concerning for breakthrough arrhythmia on Toprol-XL 25 mg.  CHA2DS2-VASc at least 6 (HTN, age x 1, DM, TIA x 2, vascular disease).  He remains on apixaban 5 mg twice daily and does not meet reduced dosing criteria.  No falls or symptoms concerning for bleeding.  Labs obtained yesterday show stable renal function and hemoglobin.  Bicuspid aortic valve  with mild to moderate aortic stenosis: Asymptomatic.  Below the surgical threshold.  Schedule follow-up echo in 03/2024.  Ascending thoracic aortic aneurysm: Largely stable dating back to 2010.  MRA of the chest in 08/2023 showed stable ascending thoracic aortic aneurysm measuring 4.8 cm when compared to study from 02/2023.  Due for repeat MRA of the chest in 08/2024 with plans to follow-up with cardiothoracic surgery thereafter for ongoing monitoring.  Does not currently meet a surgical threshold of 5 cm.    HTN: Blood pressure is mildly elevated in the office today.  At lower dose lisinopril 5 mg (previously on 10 mg) with a follow-up BMP in 1 week.  Continue Toprol-XL 25 mg.  Low-sodium diet encouraged.  HLD: LDL 69 in 11/2023.  He remains on rosuvastatin 10 mg.  Cold intolerance: Check TSH.  CBC without evidence of anemia on 11/30/2023.  No symptoms concerning for bleeding.  Unlikely cardiac in etiology.    Disposition: F/u with Dr. Okey Dupre or an APP in 6 months.   Medication Adjustments/Labs and Tests Ordered: Current medicines are reviewed at length with the patient today.  Concerns regarding medicines are outlined above. Medication changes, Labs and Tests ordered today are summarized above and listed in the Patient Instructions accessible in Encounters.   Signed, Eula Listen, PA-C 12/01/2023 9:42 AM     [facility] [place] [address] [place], [place] [address] [phone]

## 2023-11-30 ENCOUNTER — Other Ambulatory Visit: Payer: Medicare HMO

## 2023-11-30 ENCOUNTER — Other Ambulatory Visit: Payer: Self-pay

## 2023-11-30 DIAGNOSIS — E1159 Type 2 diabetes mellitus with other circulatory complications: Secondary | ICD-10-CM

## 2023-11-30 DIAGNOSIS — E785 Hyperlipidemia, unspecified: Secondary | ICD-10-CM | POA: Diagnosis not present

## 2023-11-30 DIAGNOSIS — E1169 Type 2 diabetes mellitus with other specified complication: Secondary | ICD-10-CM

## 2023-11-30 DIAGNOSIS — I10 Essential (primary) hypertension: Secondary | ICD-10-CM

## 2023-12-01 ENCOUNTER — Encounter: Payer: Self-pay | Admitting: Physician Assistant

## 2023-12-01 ENCOUNTER — Ambulatory Visit: Payer: Medicare HMO | Attending: Physician Assistant | Admitting: Physician Assistant

## 2023-12-01 VITALS — BP 130/72 | HR 67 | Resp 16 | Ht 72.0 in | Wt 208.0 lb

## 2023-12-01 DIAGNOSIS — I10 Essential (primary) hypertension: Secondary | ICD-10-CM | POA: Diagnosis not present

## 2023-12-01 DIAGNOSIS — E785 Hyperlipidemia, unspecified: Secondary | ICD-10-CM | POA: Diagnosis not present

## 2023-12-01 DIAGNOSIS — R6889 Other general symptoms and signs: Secondary | ICD-10-CM

## 2023-12-01 DIAGNOSIS — Q2381 Bicuspid aortic valve: Secondary | ICD-10-CM | POA: Diagnosis not present

## 2023-12-01 DIAGNOSIS — Z79899 Other long term (current) drug therapy: Secondary | ICD-10-CM | POA: Diagnosis not present

## 2023-12-01 DIAGNOSIS — I7121 Aneurysm of the ascending aorta, without rupture: Secondary | ICD-10-CM | POA: Diagnosis not present

## 2023-12-01 DIAGNOSIS — I350 Nonrheumatic aortic (valve) stenosis: Secondary | ICD-10-CM | POA: Diagnosis not present

## 2023-12-01 DIAGNOSIS — I480 Paroxysmal atrial fibrillation: Secondary | ICD-10-CM | POA: Diagnosis not present

## 2023-12-01 LAB — CBC WITH DIFFERENTIAL/PLATELET
Absolute Lymphocytes: 2848 {cells}/uL (ref 850–3900)
Absolute Monocytes: 563 {cells}/uL (ref 200–950)
Basophils Absolute: 34 {cells}/uL (ref 0–200)
Basophils Relative: 0.5 %
Eosinophils Absolute: 134 {cells}/uL (ref 15–500)
Eosinophils Relative: 2 %
HCT: 47.9 % (ref 38.5–50.0)
Hemoglobin: 16.2 g/dL (ref 13.2–17.1)
MCH: 30.7 pg (ref 27.0–33.0)
MCHC: 33.8 g/dL (ref 32.0–36.0)
MCV: 90.7 fL (ref 80.0–100.0)
MPV: 10.7 fL (ref 7.5–12.5)
Monocytes Relative: 8.4 %
Neutro Abs: 3122 {cells}/uL (ref 1500–7800)
Neutrophils Relative %: 46.6 %
Platelets: 164 10*3/uL (ref 140–400)
RBC: 5.28 10*6/uL (ref 4.20–5.80)
RDW: 13 % (ref 11.0–15.0)
Total Lymphocyte: 42.5 %
WBC: 6.7 10*3/uL (ref 3.8–10.8)

## 2023-12-01 LAB — HEMOGLOBIN A1C
Hgb A1c MFr Bld: 7 %{Hb} — ABNORMAL HIGH (ref <5.7)
Mean Plasma Glucose: 154 mg/dL
eAG (mmol/L): 8.5 mmol/L

## 2023-12-01 LAB — COMPLETE METABOLIC PANEL WITHOUT GFR
AG Ratio: 1.6 (calc) (ref 1.0–2.5)
ALT: 23 U/L (ref 9–46)
AST: 22 U/L (ref 10–35)
Albumin: 4.1 g/dL (ref 3.6–5.1)
Alkaline phosphatase (APISO): 62 U/L (ref 35–144)
BUN: 12 mg/dL (ref 7–25)
CO2: 27 mmol/L (ref 20–32)
Calcium: 9.2 mg/dL (ref 8.6–10.3)
Chloride: 107 mmol/L (ref 98–110)
Creat: 1.3 mg/dL (ref 0.70–1.35)
Globulin: 2.6 g/dL (ref 1.9–3.7)
Glucose, Bld: 124 mg/dL — ABNORMAL HIGH (ref 65–99)
Potassium: 4.5 mmol/L (ref 3.5–5.3)
Sodium: 142 mmol/L (ref 135–146)
Total Bilirubin: 0.5 mg/dL (ref 0.2–1.2)
Total Protein: 6.7 g/dL (ref 6.1–8.1)

## 2023-12-01 LAB — LIPID PANEL
Cholesterol: 128 mg/dL (ref <200)
HDL: 41 mg/dL (ref >=40)
LDL Cholesterol (Calc): 69 mg/dL
Non-HDL Cholesterol (Calc): 87 mg/dL (ref <130)
Total CHOL/HDL Ratio: 3.1 (calc) (ref <5.0)
Triglycerides: 97 mg/dL (ref <150)

## 2023-12-01 MED ORDER — LISINOPRIL 5 MG PO TABS: 5.0000 mg | ORAL_TABLET | Freq: Every day | ORAL | 3 refills | Status: AC

## 2023-12-01 NOTE — Patient Instructions (Signed)
 Medication Instructions:  Your physician recommends the following medication changes.  START TAKING: Lisinopril 5 mg daily  *If you need a refill on your cardiac medications before your next appointment, please call your pharmacy*  Lab Work: Your provider would like for you to return in 1 week to have the following labs drawn: BMeT and TSH.   Please go to [facility] [address] ([facility]) [address], [place] [address] You do not need an appointment.  They are open from 8 am- 4:30 pm.  Lunch from 1:00 pm- 2:00 pm You DO NOT need to be fasting.  Testing/Procedures: Your physician has requested that you have an echocardiogram. Echocardiography is a painless test that uses sound waves to create images of your heart. It provides your doctor with information about the size and shape of your heart and how well your heart's chambers and valves are working.   You may receive an ultrasound enhancing agent through an IV if needed to better visualize your heart during the echo. This procedure takes approximately one hour.  There are no restrictions for this procedure.  This will take place at [address] [facility]) [address], [place] [address]  Please note: We ask at that you not bring children with you during ultrasound (echo/ vascular) testing. Due to room size and safety concerns, children are not allowed in the ultrasound rooms during exams. Our front office staff cannot provide observation of children in our lobby area while testing is being conducted. An adult accompanying a patient to their appointment will only be allowed in the ultrasound room at the discretion of the ultrasound technician under special circumstances. We apologize for any inconvenience.    You are scheduled for Cardiac MRI at the location below.  Please arrive for your appointment at ______________ . ?  [facility] [address] [place], [place]  [address] Please go to the [facility] and check-in with the desk attendant.   Magnetic resonance imaging (MRI) is a painless test that produces images of the inside of the body without using Xrays.  During an MRI, strong magnets and radio waves work together in a Data processing manager to form detailed images.   MRI images may provide more details about a medical condition than X-rays, CT scans, and ultrasounds can provide.  You may be given earphones to listen for instructions.  You may eat a light breakfast and take medications as ordered with the exception of furosemide, hydrochlorothiazide, chlorthalidone or spironolactone (or any other fluid pill). If you are undergoing a stress MRI, please avoid stimulants for 12 hr prior to test. (I.e. Caffeine, nicotine, chocolate, or antihistamine medications)  If your provider has ordered anti-anxiety medications for this test, then you will need a driver.  An IV will be inserted into one of your veins. Contrast material will be injected into your IV. It will leave your body through your urine within a day. You may be told to drink plenty of fluids to help flush the contrast material out of your system.  You will be asked to remove all metal, including: Watch, jewelry, and other metal objects including hearing aids, hair pieces and dentures. Also wearable glucose monitoring systems (ie. Freestyle Libre and Omnipods) (Braces and fillings normally are not a problem.)  TEST WILL TAKE APPROXIMATELY 1 HOUR  PLEASE NOTIFY SCHEDULING AT LEAST 24 HOURS IN ADVANCE IF YOU ARE UNABLE TO KEEP YOUR APPOINTMENT. [phone]  For more information and frequently asked questions,  please visit our website : [email or web address]  Please call the Cardiac Imaging Nurse Navigators with any questions/concerns. [phone] Office   Follow-Up: At [facility], you and your health needs are our priority.  As part of our continuing mission to provide  you with exceptional heart care, our providers are all part of one team.  This team includes your primary Cardiologist (physician) and Advanced Practice Providers or APPs (Physician Assistants and Nurse Practitioners) who all work together to provide you with the care you need, when you need it.  Your next appointment:   6 month(s)  Provider:   You may see [name], MD or Eula Listen, PA-C

## 2023-12-05 ENCOUNTER — Encounter: Payer: Self-pay | Admitting: Family Medicine

## 2023-12-05 ENCOUNTER — Ambulatory Visit: Payer: Medicare HMO | Admitting: Family Medicine

## 2023-12-05 VITALS — BP 124/72 | HR 61 | Temp 98.3°F | Ht 72.0 in | Wt 204.6 lb

## 2023-12-05 DIAGNOSIS — E785 Hyperlipidemia, unspecified: Secondary | ICD-10-CM

## 2023-12-05 DIAGNOSIS — E1159 Type 2 diabetes mellitus with other circulatory complications: Secondary | ICD-10-CM | POA: Diagnosis not present

## 2023-12-05 DIAGNOSIS — H6122 Impacted cerumen, left ear: Secondary | ICD-10-CM | POA: Diagnosis not present

## 2023-12-05 DIAGNOSIS — E1169 Type 2 diabetes mellitus with other specified complication: Secondary | ICD-10-CM

## 2023-12-05 DIAGNOSIS — Z7984 Long term (current) use of oral hypoglycemic drugs: Secondary | ICD-10-CM | POA: Diagnosis not present

## 2023-12-05 DIAGNOSIS — I10 Essential (primary) hypertension: Secondary | ICD-10-CM | POA: Diagnosis not present

## 2023-12-05 DIAGNOSIS — I480 Paroxysmal atrial fibrillation: Secondary | ICD-10-CM

## 2023-12-05 DIAGNOSIS — I712 Thoracic aortic aneurysm, without rupture, unspecified: Secondary | ICD-10-CM

## 2023-12-05 NOTE — Progress Notes (Signed)
 Subjective:  HPI: Timothy Bernard is a 68 y.o. male presenting on 12/05/2023 for Follow-up (6 month f/u/)   HPI Patient is in today for chronic condition follow up. He is followed by cardiology and endocrinology. Yearly eye exam did show retinopathy, he is followed by Ophthalmology. Timothy Bernard is due for his colonoscopy.  HYPERTENSION / HYPERLIPIDEMIA Satisfied with current treatment? yes Duration of hypertension: chronic BP monitoring frequency: daily BP range: 120/70s BP medication side effects: no Past BP meds: lisinopril, metoprolol Duration of hyperlipidemia: chronic Cholesterol medication side effects: no Cholesterol supplements: none Past cholesterol medications: rosuvastatin (crestor) Medication compliance: excellent compliance Aspirin: no Recent stressors: no Recurrent headaches: no Visual changes: no Palpitations: no Dyspnea: no Chest pain: no Lower extremity edema: no Dizzy/lightheaded: no  DIABETES Hypoglycemic episodes:no Polydipsia/polyuria: no Visual disturbance: no Chest pain: no Paresthesias: no Glucose Monitoring: yes  Accucheck frequency: Daily  Fasting glucose: 110-125  Post prandial:  Evening:  Before meals: Taking Insulin?: no  Long acting insulin:  Short acting insulin: Blood Pressure Monitoring: daily Retinal Examination: Up to Date Foot Exam: Up to Date Diabetic Education: Completed Pneumovax: Up to Date Influenza: Up to Date Aspirin: no, eliquis  PAROXYSMAL ATRIAL FIBRILLATION:  Continue Topril-XL 25mg  daily and Eliquis 5mg  BID No bleeding or falls  BICUSPID AORTIC VALVE: Asymptomatic, ECHO follow-up scheduled 03/2024  ASCENDING THORACIC AORTIC ANEURYSM: 08/2023 MRA stable at 4.8cm, follow up 08/2024  Ear Cerumen Removal  Date/Time: 12/05/2023 8:33 AM  Performed by: Park Meo, FNP Authorized by: Park Meo, FNP   Anesthesia: Local Anesthetic: none Location details: left ear Patient tolerance: patient tolerated  the procedure well with no immediate complications Procedure type: irrigation  Sedation: Patient sedated: no       Review of Systems  HENT:  Positive for tinnitus.   All other systems reviewed and are negative.   Relevant past medical history reviewed and updated as indicated.   Past Medical History:  Diagnosis Date   Adenomatous colon polyp 09/29/2007   12mm cecal adenomatous polyp 10 mm adenoama, post-polypectomy burn syndrome   Ascending aortic aneurysm (HCC)    4.8 cm on ech (5/10), 4.6 x 4.6 cm by MRA 6/10 4.6 cm aortic root and ascending aorta by MRA 9/10. MRA chest (3/11) 4.5 ascending aorta. Stable by MRA 6/18   Bicuspid aortic valve    echo (5/10) with bicuspid AoV (upon re-review), mild LV dilation, EF 50-55%, no aortic stenosis, mild AI, moderate ascending aortic dilation (4.8 cm). 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 Echo (3/11) bicuspid aortic valve with no AS but mild AI, mild LVH, EF 60%.     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   OSA on CPAP    Paroxysmal atrial fibrillation (HCC) 02/2022   TIA (transient ischemic attack) 01/2022     Past Surgical History:  Procedure Laterality Date   COLONOSCOPY W/ BIOPSIES AND POLYPECTOMY  09/29/2007   Mole removed      Allergies and medications reviewed and updated.   Current Outpatient Medications:    apixaban (ELIQUIS) 5 MG TABS tablet, Take 1 tablet (5 mg total) by mouth 2 (two) times daily., Disp: 180 tablet, Rfl: 1   canagliflozin (INVOKANA) 300 MG TABS tablet, Take 1 tablet (300 mg total) by mouth daily before breakfast., Disp: 90 tablet, Rfl: 3   Flaxseed, Linseed, (FLAX  SEED OIL) 1000 MG CAPS, Take 1 capsule by mouth daily., Disp: , Rfl:    glimepiride (AMARYL) 1 MG tablet, Take 1 tablet (1 mg total) by mouth daily before breakfast., Disp: 90 tablet, Rfl: 3    lisinopril (ZESTRIL) 5 MG tablet, Take 1 tablet (5 mg total) by mouth daily., Disp: 90 tablet, Rfl: 3   metFORMIN (GLUCOPHAGE-XR) 500 MG 24 hr tablet, Take 1 tablet (500 mg total) by mouth daily in the afternoon., Disp: 90 tablet, Rfl: 3   metoprolol succinate (TOPROL-XL) 25 MG 24 hr tablet, Take 1 tablet (25 mg total) by mouth daily., Disp: 90 tablet, Rfl: 3   rosuvastatin (CRESTOR) 10 MG tablet, Take 1 tablet (10 mg total) by mouth daily., Disp: 90 tablet, Rfl: 1   TRUE METRIX BLOOD GLUCOSE TEST test strip, TEST BLOOD SUGAR ONE TIME DAILY, Disp: 100 strip, Rfl: 3  No Known Allergies  Objective:   BP 124/72   Pulse 61   Temp 98.3 F (36.8 C)   Ht 6' (1.829 m)   Wt 204 lb 9.6 oz (92.8 kg)   SpO2 99%   BMI 27.75 kg/m      12/05/2023    7:58 AM 12/01/2023    8:23 AM 12/01/2023    8:16 AM  Vitals with BMI  Height 6\' 0"   6\' 0"   Weight 204 lbs 10 oz  208 lbs  BMI 27.74  28.2  Systolic 124 130 355  Diastolic 72 72 80  Pulse 61  67     Physical Exam Vitals and nursing note reviewed.  Constitutional:      Appearance: Normal appearance. He is normal weight.  HENT:     Head: Normocephalic and atraumatic.     Right Ear: Tympanic membrane, ear canal and external ear normal.     Left Ear: Tympanic membrane, ear canal and external ear normal. There is impacted cerumen.  Neck:     Vascular: No carotid bruit.  Cardiovascular:     Rate and Rhythm: Normal rate and regular rhythm.     Pulses: Normal pulses.     Heart sounds: Normal heart sounds.  Pulmonary:     Effort: Pulmonary effort is normal.     Breath sounds: Normal breath sounds.  Skin:    General: Skin is warm and dry.     Capillary Refill: Capillary refill takes less than 2 seconds.  Neurological:     General: No focal deficit present.     Mental Status: He is alert and oriented to person, place, and time. Mental status is at baseline.  Psychiatric:        Mood and Affect: Mood normal.        Behavior: Behavior normal.         Thought Content: Thought content normal.        Judgment: Judgment normal.     Assessment & Plan:  Impacted cerumen of left ear Assessment & Plan: Attempted disimpaction of cerumen with irrigation and curette. Unable to completely clear. Pt will use Debrox drops for 2-3 days and return to office if symptoms persist.   Type 2 diabetes mellitus with other circulatory complication, without long-term current use of insulin (HCC) Assessment & Plan: A1c 7.0%. Followed by Endo. Continue Invokana 300mg  daily, Amaryl 1mg  daily, and Metformin XR 500mg  daily. A1c and uACR UTD. Foot exam UTD. Vaccines UTD. Retinal eye exam UTD. Recommend heart healthy diet such as Mediterranean diet with whole grains, fruits, vegetable, fish, lean meats, nuts, and  olive oil. Limit salt. Encouraged moderate walking, 3-5 times/week for 30-50 minutes each session. Aim for at least 150 minutes.week. Goal should be pace of 3 miles/hours, or walking 1.5 miles in 30 minutes. Seek medical care for urinary frequency, extreme thirst, vision changes, lightheadedness, dizziness.  Follow up in 6 months or sooner if needed   Hyperlipidemia associated with type 2 diabetes mellitus (HCC) Assessment & Plan: Continue Crestor 10mg  daily. LDL at goal <70. I recommend consuming a heart healthy diet such as Mediterranean diet or DASH diet with whole grains, fruits, vegetable, fish, lean meats, nuts, and olive oil. Limit sweets and processed foods. I also encourage moderate intensity exercise 150 minutes weekly. This is 3-5 times weekly for 30-50 minutes each session. Goal should be pace of 3 miles/hours, or walking 1.5 miles in 30 minutes.   Essential hypertension Assessment & Plan: Continue Lisinopril 5mg  daily and Metoprolol XL 25mg  daily. Recommend heart healthy diet such as Mediterranean diet with whole grains, fruits, vegetable, fish, lean meats, nuts, and olive oil. Limit salt. Encouraged moderate walking, 3-5 times/week for 30-50  minutes each session. Aim for at least 150 minutes.week. Goal should be pace of 3 miles/hours, or walking 1.5 miles in 30 minutes. Avoid tobacco products. Avoid excess alcohol. Take medications as prescribed and bring medications and blood pressure log with cuff to each office visit. Seek medical care for chest pain, palpitations, shortness of breath with exertion, dizziness/lightheadedness, vision changes, recurrent headaches, or swelling of extremities. Follow up in 6 months for CPE   Paroxysmal atrial fibrillation [facility]) Assessment & Plan: Regular rate and rhythm today. Continue Eliquis and Metoprolol. Followed by Cardiology.   Thoracic aortic aneurysm without rupture, unspecified part (HCC) Assessment & Plan: Stable at 4.3cm. Annual imaging. BP well controlled.    Other orders -     Ear Cerumen Removal     Follow up plan: Return in about 6 months (around 06/05/2024) for annual physical with labs 1 week prior, AWV.  Park Meo, FNP

## 2023-12-05 NOTE — Assessment & Plan Note (Addendum)
 Attempted disimpaction of cerumen with irrigation and curette. Unable to completely clear. Pt will use Debrox drops for 2-3 days and return to office if symptoms persist.

## 2023-12-05 NOTE — Patient Instructions (Signed)
 Call [facility] to schedule colonoscopy [phone]

## 2023-12-05 NOTE — Assessment & Plan Note (Addendum)
 Continue Crestor 10mg  daily. LDL at goal <70. I recommend consuming a heart healthy diet such as Mediterranean diet or DASH diet with whole grains, fruits, vegetable, fish, lean meats, nuts, and olive oil. Limit sweets and processed foods. I also encourage moderate intensity exercise 150 minutes weekly. This is 3-5 times weekly for 30-50 minutes each session. Goal should be pace of 3 miles/hours, or walking 1.5 miles in 30 minutes.

## 2023-12-05 NOTE — Assessment & Plan Note (Signed)
 Stable at 4.3cm. Annual imaging. BP well controlled.

## 2023-12-05 NOTE — Assessment & Plan Note (Signed)
 Regular rate and rhythm today. Continue Eliquis and Metoprolol. Followed by Cardiology.

## 2023-12-05 NOTE — Assessment & Plan Note (Signed)
 Continue Lisinopril 5mg  daily and Metoprolol XL 25mg  daily. Recommend heart healthy diet such as Mediterranean diet with whole grains, fruits, vegetable, fish, lean meats, nuts, and olive oil. Limit salt. Encouraged moderate walking, 3-5 times/week for 30-50 minutes each session. Aim for at least 150 minutes.week. Goal should be pace of 3 miles/hours, or walking 1.5 miles in 30 minutes. Avoid tobacco products. Avoid excess alcohol. Take medications as prescribed and bring medications and blood pressure log with cuff to each office visit. Seek medical care for chest pain, palpitations, shortness of breath with exertion, dizziness/lightheadedness, vision changes, recurrent headaches, or swelling of extremities. Follow up in 6 months for CPE

## 2023-12-05 NOTE — Assessment & Plan Note (Signed)
 A1c 7.0%. Followed by Endo. Continue Invokana 300mg  daily, Amaryl 1mg  daily, and Metformin XR 500mg  daily. A1c and uACR UTD. Foot exam UTD. Vaccines UTD. Retinal eye exam UTD. Recommend heart healthy diet such as Mediterranean diet with whole grains, fruits, vegetable, fish, lean meats, nuts, and olive oil. Limit salt. Encouraged moderate walking, 3-5 times/week for 30-50 minutes each session. Aim for at least 150 minutes.week. Goal should be pace of 3 miles/hours, or walking 1.5 miles in 30 minutes. Seek medical care for urinary frequency, extreme thirst, vision changes, lightheadedness, dizziness.  Follow up in 6 months or sooner if needed

## 2023-12-07 ENCOUNTER — Ambulatory Visit

## 2023-12-07 DIAGNOSIS — H6122 Impacted cerumen, left ear: Secondary | ICD-10-CM | POA: Diagnosis not present

## 2023-12-07 NOTE — Progress Notes (Signed)
 Patient is in office today for a nurse visit for Ear Lavage. Patient ear Irrigation was preformed on left ear.

## 2023-12-14 ENCOUNTER — Other Ambulatory Visit: Payer: Self-pay | Admitting: Emergency Medicine

## 2023-12-14 DIAGNOSIS — I10 Essential (primary) hypertension: Secondary | ICD-10-CM | POA: Diagnosis not present

## 2023-12-14 DIAGNOSIS — Z79899 Other long term (current) drug therapy: Secondary | ICD-10-CM

## 2023-12-15 LAB — TSH: TSH: 0.867 u[IU]/mL (ref 0.450–4.500)

## 2023-12-15 LAB — BASIC METABOLIC PANEL WITH GFR
BUN/Creatinine Ratio: 16 (ref 10–24)
BUN: 21 mg/dL (ref 8–27)
CO2: 19 mmol/L — ABNORMAL LOW (ref 20–29)
Calcium: 9.2 mg/dL (ref 8.6–10.2)
Chloride: 106 mmol/L (ref 96–106)
Creatinine, Ser: 1.34 mg/dL — ABNORMAL HIGH (ref 0.76–1.27)
Glucose: 123 mg/dL — ABNORMAL HIGH (ref 70–99)
Potassium: 4.5 mmol/L (ref 3.5–5.2)
Sodium: 140 mmol/L (ref 134–144)
eGFR: 58 mL/min/{1.73_m2} — ABNORMAL LOW (ref >59)

## 2024-01-03 ENCOUNTER — Encounter: Payer: Self-pay | Admitting: Gastroenterology

## 2024-01-09 ENCOUNTER — Other Ambulatory Visit: Payer: Self-pay | Admitting: Internal Medicine

## 2024-01-17 ENCOUNTER — Ambulatory Visit: Admitting: Gastroenterology

## 2024-01-17 ENCOUNTER — Telehealth: Payer: Self-pay

## 2024-01-17 ENCOUNTER — Encounter: Payer: Self-pay | Admitting: Gastroenterology

## 2024-01-17 VITALS — BP 134/76 | HR 66 | Ht 72.0 in | Wt 205.8 lb

## 2024-01-17 DIAGNOSIS — Z860100 Personal history of colon polyps, unspecified: Secondary | ICD-10-CM

## 2024-01-17 DIAGNOSIS — I480 Paroxysmal atrial fibrillation: Secondary | ICD-10-CM

## 2024-01-17 DIAGNOSIS — I7121 Aneurysm of the ascending aorta, without rupture: Secondary | ICD-10-CM

## 2024-01-17 DIAGNOSIS — G4733 Obstructive sleep apnea (adult) (pediatric): Secondary | ICD-10-CM

## 2024-01-17 DIAGNOSIS — Z8673 Personal history of transient ischemic attack (TIA), and cerebral infarction without residual deficits: Secondary | ICD-10-CM

## 2024-01-17 DIAGNOSIS — Z7901 Long term (current) use of anticoagulants: Secondary | ICD-10-CM | POA: Diagnosis not present

## 2024-01-17 DIAGNOSIS — I350 Nonrheumatic aortic (valve) stenosis: Secondary | ICD-10-CM | POA: Diagnosis not present

## 2024-01-17 DIAGNOSIS — Z860101 Personal history of adenomatous and serrated colon polyps: Secondary | ICD-10-CM

## 2024-01-17 MED ORDER — NA SULFATE-K SULFATE-MG SULF 17.5-3.13-1.6 GM/177ML PO SOLN
1.0000 | Freq: Once | ORAL | 0 refills | Status: AC
Start: 2024-01-17 — End: 2024-01-17

## 2024-01-17 NOTE — Patient Instructions (Signed)
 You have been scheduled for a colonoscopy. Please follow written instructions given to you at your visit today.   If you use inhalers (even only as needed), please bring them with you on the day of your procedure.  DO NOT TAKE 7 DAYS PRIOR TO TEST- Trulicity  (dulaglutide ) Ozempic , Wegovy  (semaglutide ) Mounjaro (tirzepatide) Bydureon Bcise (exanatide extended release)  DO NOT TAKE 1 DAY PRIOR TO YOUR TEST Rybelsus  (semaglutide ) Adlyxin (lixisenatide) Victoza (liraglutide) Byetta (exanatide) ___________________________________________________________________________  Elene Griffes will be contacted by our office prior to your procedure for directions on holding your ELIQUIS .  If you do not hear from our office 1 week prior to your scheduled procedure, please call [phone] to discuss.   We will request Cardiac Clearance from [facility].  Thank you for entrusting me with your care and for choosing [facility], [place], [place]  If your blood pressure at your visit was 140/90 or greater, please contact your primary care physician to follow up on this. ______________________________________________________  If you are age 33 or older, your body mass index should be between 23-30. Your Body mass index is 27.91 kg/m. If this is out of the aforementioned range listed, please consider follow up with your Primary Care Provider.  If you are age 19 or younger, your body mass index should be between 19-25. Your Body mass index is 27.91 kg/m. If this is out of the aformentioned range listed, please consider follow up with your Primary Care Provider.  ________________________________________________________  The [facility] providers would like to encourage you to use MYCHART to communicate with providers for non-urgent requests or questions.  Due to long hold times on the telephone, sending your provider a message by [facility] may be a faster and more efficient way to get a response.  Please  allow 48 business hours for a response.  Please remember that this is for non-urgent requests.  _______________________________________________________  Due to recent changes in healthcare laws, you may see the results of your imaging and laboratory studies on MyChart before your provider has had a chance to review them.  We understand that in some cases there may be results that are confusing or concerning to you. Not all laboratory results come back in the same time frame and the provider may be waiting for multiple results in order to interpret others.  Please give us  48 hours in order for your provider to thoroughly review all the results before contacting the office for clarification of your results.
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
## 2024-01-17 NOTE — Telephone Encounter (Signed)
 [facility] Pre-operative Risk Assessment     Request for surgical clearance:     Endoscopy Procedure  What type of surgery is being performed?   COLONOSCOPY  When is this surgery scheduled?     02-29-24  What type of clearance is required ?   Pharmacy AND Cardiac  Are there any medications that need to be held prior to surgery and how long? Eliquis  2 days  Practice name and name of physician performing surgery?   Dr. Loy Ruff   [facility]  What is your office phone and fax number?      Phone- [phone]  Fax- [phone]  Anesthesia type (None, local, MAC, general) ?       MAC   Please route your response to Marinus Sic, CMA

## 2024-01-18 NOTE — Telephone Encounter (Signed)
 Called and spoke to patient.  He understands not to take his Eliquis  on 7-1 for 7-2 procedure.

## 2024-01-18 NOTE — Telephone Encounter (Signed)
 OK to proceed with colonoscopy and holding Eliquis  x 1 day

## 2024-01-18 NOTE — Telephone Encounter (Signed)
 Dr. Willy Harvest - FYI- patient has been cleared for 1 day hold of Eliquis  instead of 2.  OK to proceed? Thank you

## 2024-01-18 NOTE — Telephone Encounter (Signed)
   Patient Name: Timothy Bernard  DOB: September 02, 1955 MRN: [id number]  Primary Cardiologist: Sammy Crisp, MD  Chart reviewed as part of pre-operative protocol coverage. Given past medical history and time since last visit, based on ACC/AHA guidelines, LANGFORD CARIAS is at acceptable risk for the planned procedure without further cardiovascular testing.   Procedure: colonoscopy Date of procedure: 02/29/24     CHA2DS2-VASc Score = 6  This indicates a 9.7% annual risk of stroke. The patient's score is based upon: CHF History: 0 HTN History: 1 Diabetes History: 1 Stroke History: 2 Vascular Disease History: 1 Age Score: 1 Gender Score: 0       CrCl 69 ml/min Platelet count 164K   Due to history of TIA, recommend one day Eliquis  hold  The patient was advised that if he develops new symptoms prior to surgery to contact our office to arrange for a follow-up visit, and he verbalized understanding.  I will route this recommendation to the requesting party via Epic fax function and remove from pre-op pool.  Please call with questions.  Friddie Jetty, NP 01/18/2024, 8:02 AM

## 2024-01-18 NOTE — Telephone Encounter (Signed)
 Patient with diagnosis of A Fib on Eliquis  for anticoagulation.    Procedure: colonoscopy Date of procedure: 02/29/24   CHA2DS2-VASc Score = 6  This indicates a 9.7% annual risk of stroke. The patient's score is based upon: CHF History: 0 HTN History: 1 Diabetes History: 1 Stroke History: 2 Vascular Disease History: 1 Age Score: 1 Gender Score: 0     CrCl 69 ml/min Platelet count 164K  Due to history of TIA, recommend one day Eliquis  hold  **This guidance is not considered finalized until pre-operative APP has relayed final recommendations.**

## 2024-02-15 ENCOUNTER — Ambulatory Visit: Payer: Medicare HMO | Admitting: Internal Medicine

## 2024-02-15 ENCOUNTER — Encounter: Payer: Self-pay | Admitting: Internal Medicine

## 2024-02-15 VITALS — BP 104/66 | HR 71 | Ht 72.0 in | Wt 205.0 lb

## 2024-02-15 DIAGNOSIS — E1165 Type 2 diabetes mellitus with hyperglycemia: Secondary | ICD-10-CM

## 2024-02-15 DIAGNOSIS — E119 Type 2 diabetes mellitus without complications: Secondary | ICD-10-CM

## 2024-02-15 DIAGNOSIS — Z7984 Long term (current) use of oral hypoglycemic drugs: Secondary | ICD-10-CM

## 2024-02-15 LAB — POCT GLYCOSYLATED HEMOGLOBIN (HGB A1C): Hemoglobin A1C: 6.6 % — AB (ref 4.0–5.6)

## 2024-02-15 MED ORDER — INVOKANA 300 MG PO TABS: 300.0000 mg | ORAL_TABLET | Freq: Every day | ORAL | 3 refills | Status: DC

## 2024-02-15 MED ORDER — GLIMEPIRIDE 1 MG PO TABS: 1.0000 mg | ORAL_TABLET | Freq: Every day | ORAL | 3 refills | Status: DC

## 2024-02-15 NOTE — Patient Instructions (Signed)
 Continue Glimepiride  1 mg , 1 tablet before Breakfast  Continue Invokana  300 mg daily      HOW TO TREAT LOW BLOOD SUGARS (Blood sugar LESS THAN 70 MG/DL) Please follow the RULE OF 15 for the treatment of hypoglycemia treatment (when your (blood sugars are less than 70 mg/dL)   STEP 1: Take 15 grams of carbohydrates when your blood sugar is low, which includes:  3-4 GLUCOSE TABS  OR 3-4 OZ OF JUICE OR REGULAR SODA OR ONE TUBE OF GLUCOSE GEL    STEP 2: RECHECK blood sugar in 15 MINUTES STEP 3: If your blood sugar is still low at the 15 minute recheck --> then, go back to STEP 1 and treat AGAIN with another 15 grams of carbohydrates.
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
## 2024-02-29 ENCOUNTER — Ambulatory Visit: Admitting: Internal Medicine

## 2024-02-29 ENCOUNTER — Encounter: Payer: Self-pay | Admitting: Internal Medicine

## 2024-02-29 VITALS — BP 122/74 | HR 66 | Temp 98.2°F | Resp 13 | Ht 72.0 in | Wt 205.0 lb

## 2024-02-29 DIAGNOSIS — D123 Benign neoplasm of transverse colon: Secondary | ICD-10-CM | POA: Diagnosis not present

## 2024-02-29 DIAGNOSIS — G4733 Obstructive sleep apnea (adult) (pediatric): Secondary | ICD-10-CM | POA: Diagnosis not present

## 2024-02-29 DIAGNOSIS — D125 Benign neoplasm of sigmoid colon: Secondary | ICD-10-CM

## 2024-02-29 DIAGNOSIS — Z860101 Personal history of adenomatous and serrated colon polyps: Secondary | ICD-10-CM

## 2024-02-29 DIAGNOSIS — I480 Paroxysmal atrial fibrillation: Secondary | ICD-10-CM | POA: Diagnosis not present

## 2024-02-29 DIAGNOSIS — Z1211 Encounter for screening for malignant neoplasm of colon: Secondary | ICD-10-CM | POA: Diagnosis not present

## 2024-02-29 DIAGNOSIS — I639 Cerebral infarction, unspecified: Secondary | ICD-10-CM | POA: Insufficient documentation

## 2024-02-29 DIAGNOSIS — I10 Essential (primary) hypertension: Secondary | ICD-10-CM | POA: Diagnosis not present

## 2024-02-29 DIAGNOSIS — Z860100 Personal history of colon polyps, unspecified: Secondary | ICD-10-CM

## 2024-02-29 DIAGNOSIS — E119 Type 2 diabetes mellitus without complications: Secondary | ICD-10-CM | POA: Diagnosis not present

## 2024-02-29 MED ORDER — SODIUM CHLORIDE 0.9 % IV SOLN: 500.0000 mL | INTRAVENOUS | Status: DC

## 2024-02-29 NOTE — Patient Instructions (Addendum)
 I removed 2 tiny polyps today. I will let you know pathology results and when to have another routine colonoscopy by mail and/or My Chart. Resume Eliquis  tomorrow.  I appreciate the opportunity to care for you. Timothy CHARLENA Commander, MD, FACG  YOU HAD AN ENDOSCOPIC PROCEDURE TODAY AT THE [facility]:   Refer to the procedure report that was given to you for any specific questions about what was found during the examination.  If the procedure report does not answer your questions, please call your gastroenterologist to clarify.  If you requested that your care partner not be given the details of your procedure findings, then the procedure report has been included in a sealed envelope for you to review at your convenience later.  YOU SHOULD EXPECT: Some feelings of bloating in the abdomen. Passage of more gas than usual.  Walking can help get rid of the air that was put into your GI tract during the procedure and reduce the bloating. If you had a lower endoscopy (such as a colonoscopy or flexible sigmoidoscopy) you may notice spotting of blood in your stool or on the toilet paper. If you underwent a bowel prep for your procedure, you may not have a normal bowel movement for a few days.  Please Note:  You might notice some irritation and congestion in your nose or some drainage.  This is from the oxygen used during your procedure.  There is no need for concern and it should clear up in a day or so.  SYMPTOMS TO REPORT IMMEDIATELY:  Following lower endoscopy (colonoscopy or flexible sigmoidoscopy):  Excessive amounts of blood in the stool  Significant tenderness or worsening of abdominal pains  Swelling of the abdomen that is new, acute  Fever of 100F or higher  For urgent or emergent issues, a gastroenterologist can be reached at any hour by calling [phone]. Do not use MyChart messaging for urgent concerns.    DIET:  We do recommend a small meal at first, but then you may proceed  to your regular diet.  Drink plenty of fluids but you should avoid alcoholic beverages for 24 hours.  ACTIVITY:  You should plan to take it easy for the rest of today and you should NOT DRIVE or use heavy machinery until tomorrow (because of the sedation medicines used during the test).    FOLLOW UP: Our staff will call the number listed on your records the next business day following your procedure.  We will call around 7:15- 8:00 am to check on you and address any questions or concerns that you may have regarding the information given to you following your procedure. If we do not reach you, we will leave a message.     If any biopsies were taken you will be contacted by phone or by letter within the next 1-3 weeks.  Please call us  at [phone] if you have not heard about the biopsies in 3 weeks.    SIGNATURES/CONFIDENTIALITY: You and/or your care partner have signed paperwork which will be entered into your electronic medical record.  These signatures attest to the fact that that the information above on your After Visit Summary has been reviewed and is understood.  Full responsibility of the confidentiality of this discharge information lies with you and/or your care-partner.

## 2024-02-29 NOTE — Progress Notes (Signed)
 Vss nad trans to pacu

## 2024-02-29 NOTE — Progress Notes (Signed)
 Called to room to assist during endoscopic procedure.  Patient ID and intended procedure confirmed with present staff. Received instructions for my participation in the procedure from the performing physician.

## 2024-02-29 NOTE — Op Note (Signed)
 [facility] Patient Name: Timothy Bernard Procedure Date: 02/29/2024 10:35 AM MRN: [id number] Endoscopist: Lupita FORBES Commander , MD, [id number] Age: 68 Referring MD:  Date of Birth: 1956/01/25 Gender: Male Account #: [id number] Procedure:                Colonoscopy Indications:              High risk colon cancer surveillance: Personal                            history of colonic polyps, Last colonoscopy: April                            2019 Medicines:                Monitored Anesthesia Care Procedure:                Pre-Anesthesia Assessment:                           - Prior to the procedure, a History and Physical                            was performed, and patient medications and                            allergies were reviewed. The patient's tolerance of                            previous anesthesia was also reviewed. The risks                            and benefits of the procedure and the sedation                            options and risks were discussed with the patient.                            All questions were answered, and informed consent                            was obtained. Prior Anticoagulants: The patient                            last took Eliquis  (apixaban ) 2 days prior to the                            procedure. ASA Grade Assessment: III - A patient                            with severe systemic disease. After reviewing the                            risks and benefits, the patient was deemed in  satisfactory condition to undergo the procedure.                           After obtaining informed consent, the colonoscope                            was passed under direct vision. Throughout the                            procedure, the patient's blood pressure, pulse, and                            oxygen saturations were monitored continuously. The                            Olympus Scope SN: [id number] was introduced  through                            the anus and advanced to the the cecum, identified                            by appendiceal orifice and ileocecal valve. The                            colonoscopy was performed without difficulty. The                            patient tolerated the procedure well. The quality                            of the bowel preparation was good. The ileocecal                            valve, appendiceal orifice, and rectum were                            photographed. The bowel preparation used was SUPREP                            via split dose instruction. Scope In: 10:46:04 AM Scope Out: 11:00:45 AM Scope Withdrawal Time: 0 hours 11 minutes 44 seconds  Total Procedure Duration: 0 hours 14 minutes 41 seconds  Findings:                 The perianal and digital rectal examinations were                            normal. Pertinent negatives include normal prostate                            (size, shape, and consistency).                           Two sessile polyps were found in the sigmoid colon  and transverse colon. The polyps were diminutive in                            size. These polyps were removed with a cold snare.                            Resection and retrieval were complete. Verification                            of patient identification for the specimen was                            done. Estimated blood loss was minimal.                           The exam was otherwise without abnormality on                            direct and retroflexion views. Complications:            No immediate complications. Estimated Blood Loss:     Estimated blood loss was minimal. Impression:               - Two diminutive polyps in the sigmoid colon and in                            the transverse colon, removed with a cold snare.                            Resected and retrieved.                           - The examination was  otherwise normal on direct                            and retroflexion views.                           - Personal history of colonic polyps.                           2009 - 2 adenomas max 12 mm (post-polypectomy burn                            syndrome)                           06/2014 - 6 adenomas max 10 mm                           11/28/2017 1 diminutive polyp removed ssp Recommendation:           - Patient has a contact number available for                            emergencies. The signs and  symptoms of potential                            delayed complications were discussed with the                            patient. Return to normal activities tomorrow.                            Written discharge instructions were provided to the                            patient.                           - Resume previous diet.                           - Continue present medications.                           - Resume Eliquis  (apixaban ) at prior dose tomorrow.                           - Repeat colonoscopy is recommended for                            surveillance. The colonoscopy date will be                            determined after pathology results from today's                            exam become available for review. Lupita FORBES Commander, MD 02/29/2024 11:07:41 AM This report has been signed electronically.

## 2024-03-01 ENCOUNTER — Telehealth: Payer: Self-pay

## 2024-03-01 NOTE — Telephone Encounter (Signed)
 Follow up call to pt, no answer.

## 2024-03-06 ENCOUNTER — Ambulatory Visit: Payer: Self-pay | Admitting: Internal Medicine

## 2024-03-06 LAB — SURGICAL PATHOLOGY

## 2024-03-26 ENCOUNTER — Other Ambulatory Visit: Payer: Self-pay | Admitting: Family Medicine

## 2024-03-26 ENCOUNTER — Other Ambulatory Visit: Payer: Self-pay | Admitting: Internal Medicine

## 2024-03-26 DIAGNOSIS — E1169 Type 2 diabetes mellitus with other specified complication: Secondary | ICD-10-CM

## 2024-03-26 DIAGNOSIS — E1159 Type 2 diabetes mellitus with other circulatory complications: Secondary | ICD-10-CM

## 2024-03-26 DIAGNOSIS — I480 Paroxysmal atrial fibrillation: Secondary | ICD-10-CM

## 2024-03-26 NOTE — Telephone Encounter (Signed)
 Prescription refill request for Eliquis  received. Indication: Afib  Last office visit: 12/01/23 ([place])  Scr: 1.34 (12/14/23)  Age: 68 Weight: 93kg  Appropriate dose. Refill sent.

## 2024-04-09 ENCOUNTER — Ambulatory Visit: Attending: Physician Assistant

## 2024-04-09 DIAGNOSIS — I350 Nonrheumatic aortic (valve) stenosis: Secondary | ICD-10-CM | POA: Diagnosis not present

## 2024-04-09 DIAGNOSIS — Q2381 Bicuspid aortic valve: Secondary | ICD-10-CM

## 2024-04-09 LAB — ECHOCARDIOGRAM COMPLETE
AR max vel: 0.96 cm2
AV Area VTI: 0.9 cm2
AV Area mean vel: 0.88 cm2
AV Mean grad: 26 mmHg
AV Peak grad: 42.3 mmHg
Ao pk vel: 3.25 m/s
Area-P 1/2: 3.12 cm2
S' Lateral: 4.39 cm

## 2024-04-11 ENCOUNTER — Ambulatory Visit: Payer: Self-pay | Admitting: Physician Assistant

## 2024-04-11 DIAGNOSIS — I7121 Aneurysm of the ascending aorta, without rupture: Secondary | ICD-10-CM

## 2024-04-11 DIAGNOSIS — Q2381 Bicuspid aortic valve: Secondary | ICD-10-CM

## 2024-05-30 NOTE — Progress Notes (Unsigned)
 Cardiology Office Note    Date:  06/01/2024   ID:  Avyukt, Cimo 12-07-1955, MRN [id number]  PCP:  Kayla Jeoffrey RAMAN, FNP  Cardiologist:  Lonni Hanson, MD  Electrophysiologist:  None   Chief Complaint: Follow-up  History of Present Illness:   Timothy Bernard is a 68 y.o. male with history of PAF diagnosed in 02/2022, bicuspid aortic valve, ascending thoracic aortic aneurysm, aortic atherosclerosis, DM2, HTN, HLD, recently diagnosed TIA, and OSA who presents for follow-up of A-fib, bicuspid aortic valve with mild to moderate stenosis, and ascending thoracic aortic aneurysm.   He has been monitored over the years for a bicuspid aortic valve and ascending thoracic aortic aneurysm with remote imaging from 01/2009 demonstrating aortic root dilatation of 4.6 cm x 4.6 cm axially by cardiac MRI.  Repeat cardiac MRI in 2016 again demonstrated a bicuspid aortic valve with an ascending thoracic aorta measuring 4.4 cm.  Outpatient cardiac monitoring in 2021 showed predominantly sinus rhythm with frequent PACs and PVCs as well as PSVT lasting up to 12 minutes.  Echo in 07/2020 demonstrated an EF of 55 to 60%, no regional wall motion abnormalities, mildly dilated LV internal cavity size, normal RV systolic function and ventricular cavity size, mildly dilated left atrium, mild to moderate aortic valve sclerosis without evidence of stenosis, though the aortic valve was poorly visualized, and moderate dilatation of the aortic root and ascending aorta measuring 47 mm.  He was admitted in 01/2022 with a TIA.  CT head showed no acute intracranial hemorrhage, with a small age-indeterminate small vessel infarct of the left gangliocapsular region.  MRI of the brain showed no evidence of acute intracranial abnormality with remote lacunar infarcts noted.  MRA of the head showed no proximal intracranial vessel occlusion or significant stenosis.  Echo showed an EF of 60 to 65%, no regional wall motion abnormalities, mild  concentric LVH, indeterminate LV diastolic function parameters, normal RV systolic function and ventricular cavity size, trivial mitral regurgitation, severe calcification of the aortic valve with trivial insufficiency and mild stenosis (previously documented to be a bicuspid aortic valve by echo in 03/2019), ascending aortic aneurysm measuring 49 mm, mild dilatation of the aortic root measuring 43 mm, and an estimated right atrial pressure of 3 mmHg.  Carotid artery ultrasound showed less than 50% stenosis bilaterally.  Outpatient cardiac monitoring showed A-fib with the longest episode lasting 4 minutes and 5 seconds with a burden of less than 1%.  In this setting, he was initiated on apixaban .  MRA of the chest in 02/2023 showed slight interval enlargement ascending thoracic aortic aneurysm with a maximal diameter of 4.8 cm (previously 4.6 cm in 02/2022).  Echo from 03/2023 showed an EF of 55 to 60%, no regional wall motion abnormalities, mild LVH, normal LV diastolic function parameters, normal RV systolic function and ventricular cavity size, mildly dilated left atrium, mild to moderate aortic valve stenosis with a mean gradient of 16.7 mmHg and a valve area of 1.58 cm, mild aortic insufficiency, mildly dilated aortic root measuring 42 mm, moderate dilation of the ascending aorta measuring 47 mm, and an estimated right atrial pressure of 3 mmHg.  In the setting of slight progression of ascending thoracic aortic aneurysm, and in the context of bicuspid aortic valve, he was evaluated by cardiothoracic surgery in 05/2023 with recommendation to continue monitoring and follow-up in their office in 6 months.  MRA of the chest from 08/2023 showed stable aneurysmal dilatation of the ascending thoracic aorta with a  maximum diameter of 4.8 cm.  He followed up with cardiothoracic surgery on 09/21/2023 with notation of a stable fusiform ascending thoracic aortic aneurysm with recommendation for repeat MRA in 08/2024 and  follow-up with their office thereafter.    He was last seen by cardiology in 11/2023 and was doing well from a cardiac perspective, no longer on lisinopril  indicating his blood pressure was in the low 100s mmHg systolic.  Blood pressure was mildly elevated in the office at 130/72 with recommendation to resume lower dose lisinopril  5 mg.  Echo in 03/2024 showed an EF of 55 to 60%, no regional wall motion abnormalities, mild LVH, normal RV systolic function and ventricular cavity size, mild mitral regurgitation, calcified aortic valve with mild insufficiency and moderate aortic stenosis with a mean gradient of 26 mmHg and a valve area of 0.9 cm, moderate dilatation of the ascending aorta measuring 48 mm with mild dilatation of the aortic root measuring 40 mm.  He comes in accompanied by his wife today and is doing well from a cardiac perspective, without symptoms of angina or cardiac decompensation.  No dyspnea, palpitations, presyncope, or syncope.  He does notice some mild dizziness if he changes positions quickly.  No lower extremity swelling, abdominal distention, or progressive orthopnea.  No falls or symptoms concerning for bleeding.  He is due for follow-up MRI of the chest and CVTS in early 2026.  Blood pressure at home is typically 110 to 115 mmHg over the 50s to 60s.   Labs independently reviewed: 01/2024 - A1c 6.6 11/2023 - TSH normal, BUN 21, serum creatinine 1.34, potassium 4.5, Hgb 16.2, PLT 164, albumin 4.1, AST/ALT normal, TC 128, TG 97, HDL 41, LDL 69  Past Medical History:  Diagnosis Date   Adenomatous colon polyp 09/29/2007   12mm cecal adenomatous polyp 10 mm adenoama, post-polypectomy burn syndrome   Ascending aortic aneurysm    4.8 cm on ech (5/10), 4.6 x 4.6 cm by MRA 6/10 4.6 cm aortic root and ascending aorta by MRA 9/10. MRA chest (3/11) 4.5 ascending aorta. Stable by MRA 6/18   Bicuspid aortic valve    echo (5/10) with bicuspid AoV (upon re-review), mild LV dilation, EF  50-55%, no aortic stenosis, mild AI, moderate ascending aortic dilation (4.8 cm). 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 Echo (3/11) bicuspid aortic valve with no AS but mild AI, mild LVH, EF 60%.     Blood transfusion without reported diagnosis    Cataract    Detached retina, right 2023   HTN (hypertension)    Hyperlipidemia    NIDDM (non-insulin  dependent diabetes mellitus) 07/2011   OSA on CPAP    Paroxysmal atrial fibrillation (HCC) 02/2022   TIA (transient ischemic attack) 01/2022    Past Surgical History:  Procedure Laterality Date   CATARACT EXTRACTION Bilateral    COLONOSCOPY W/ BIOPSIES AND POLYPECTOMY  09/29/2007   Mole removed      Current Medications: Current Meds  Medication Sig   apixaban  (ELIQUIS ) 5 MG TABS tablet TAKE 1 TABLET TWICE DAILY   canagliflozin  (INVOKANA ) 300 MG TABS tablet Take 1 tablet (300 mg total) by mouth daily before breakfast.   Flaxseed, Linseed, (FLAX SEED OIL) 1000 MG CAPS Take 1 capsule by mouth daily.   glimepiride  (AMARYL ) 1 MG tablet Take 1 tablet (1 mg total) by mouth daily before breakfast.   lisinopril  (ZESTRIL ) 5 MG tablet Take 1 tablet (5 mg total) by mouth daily.   metoprolol  succinate (TOPROL -XL) 25 MG 24 hr tablet Take  1 tablet (25 mg total) by mouth daily.   rosuvastatin  (CRESTOR ) 10 MG tablet TAKE 1 TABLET EVERY DAY   TRUE METRIX BLOOD GLUCOSE TEST test strip TEST BLOOD SUGAR ONE TIME DAILY    Allergies:   Patient has no known allergies.   Social History   Socioeconomic History   Marital status: Married    Spouse name: Not on file   Number of children: 0   Years of education: Not on file   Highest education level: Bachelor's degree (e.g., BA, AB, BS)  Occupational History   Occupation: Oceanographer: [employer]  Tobacco Use   Smoking status: Never    Passive exposure: Past (as a child)   Smokeless tobacco: Never  Vaping Use   Vaping status: Never Used  Substance and Sexual Activity   Alcohol use: No     Alcohol/week: 0.0 standard drinks of alcohol   Drug use: No   Sexual activity: Not on file  Other Topics Concern   Not on file  Social History Narrative   Comptroller.    Married, 1 stepson      No living will   Wants wife to make decisions if he is not able--stepson is alternate   Would want resuscitation attempts   Not sure about tube feeds       Pt signed designated party release form and gives Peregrine Nolt, spouse [phone], access to medical records. Can leave msg on answering machine.    Social Drivers of Corporate investment banker Strain: Low Risk  (06/10/2023)   Overall Financial Resource Strain (CARDIA)    Difficulty of Paying Living Expenses: Not hard at all  Food Insecurity: No Food Insecurity (06/10/2023)   Hunger Vital Sign    Worried About Running Out of Food in the Last Year: Never true    Ran Out of Food in the Last Year: Never true  Transportation Needs: No Transportation Needs (06/10/2023)   PRAPARE - Administrator, Civil Service (Medical): No    Lack of Transportation (Non-Medical): No  Physical Activity: Sufficiently Active (06/10/2023)   Exercise Vital Sign    Days of Exercise per Week: 5 days    Minutes of Exercise per Session: 40 min  Stress: No Stress Concern Present (06/10/2023)   [employer] of Occupational Health - Occupational Stress Questionnaire    Feeling of Stress : Not at all  Social Connections: Socially Integrated (06/10/2023)   Social Connection and Isolation Panel    Frequency of Communication with Friends and Family: More than three times a week    Frequency of Social Gatherings with Friends and Family: Three times a week    Attends Religious Services: More than 4 times per year    Active Member of Clubs or Organizations: Yes    Attends Engineer, structural: More than 4 times per year    Marital Status: Married     Family History:  The patient's family history includes Cancer in his father;  Coronary artery disease in his maternal grandfather; Diabetes in his mother; Hypertension in his mother and another family member. There is no history of Colon cancer, Prostate cancer, Esophageal cancer, Liver cancer, Pancreatic cancer, Stomach cancer, or Rectal cancer.  ROS:   12-point review of systems is negative unless otherwise noted in the HPI.   EKGs/Labs/Other Studies Reviewed:    Studies reviewed were summarized above. The additional studies were reviewed today:  2D echo 04/09/2024: 1. Left  ventricular ejection fraction, by estimation, is 55 to 60%. The  left ventricle has normal function. The left ventricle has no regional  wall motion abnormalities. There is mild left ventricular hypertrophy.  Left ventricular diastolic parameters  are indeterminate. The average left ventricular global longitudinal strain  is -13.8 %. The global longitudinal strain is abnormal.   2. Right ventricular systolic function is normal. The right ventricular  size is normal.   3. The mitral valve is degenerative. Mild mitral valve regurgitation.   4. The aortic valve is calcified. Aortic valve regurgitation is mild.  Moderate aortic valve stenosis. Aortic valve area, by VTI measures 0.90  cm. Aortic valve mean gradient measures 26.0 mmHg. Aortic valve Vmax  measures 3.25 m/s.   5. Aortic dilatation noted. There is moderate dilatation of the ascending  aorta, measuring 48 mm. There is mild dilatation of the aortic root,  measuring 40 mm.  __________  MRA chest 09/10/2023: IMPRESSION: Stable aneurysmal dilation of the ascending thoracic aorta with a maximal diameter of 4.8 cm. Ascending thoracic aortic aneurysm. Recommend semi-annual imaging followup by CTA or MRA and referral to cardiothoracic surgery if not already obtained. This recommendation follows 2010 ACCF/AHA/AATS/ACR/ASA/SCA/SCAI/SIR/STS/SVM Guidelines for the Diagnosis and Management of Patients With Thoracic Aortic Disease.  Circulation. 2010; 121: [id number]. Aortic aneurysm NOS (ICD10-I71.9) __________   2D echo 04/20/2023: 1. Left ventricular ejection fraction, by estimation, is 55 to 60%. The  left ventricle has normal function. The left ventricle has no regional  wall motion abnormalities. There is mild left ventricular hypertrophy.  Left ventricular diastolic parameters  were normal. The average left ventricular global longitudinal strain is  -13.7 %.   2. Right ventricular systolic function is normal. The right ventricular  size is normal.   3. Left atrial size was mildly dilated.   4. The mitral valve is normal in structure. No evidence of mitral valve  regurgitation. No evidence of mitral stenosis.   5. The aortic valve is normal in structure. There is moderate  calcification of the aortic valve. Aortic valve regurgitation is mild.  Mild to moderate aortic valve stenosis. Aortic valve area, by VTI measures  1.58 cm. Aortic valve mean gradient measures   16.7 mmHg. Aortic valve Vmax measures 2.60 m/s.   6. There is mild dilatation of the aortic root, measuring 42 mm. There is  moderate dilatation of the ascending aorta, measuring 47 mm.   7. The inferior vena cava is normal in size with greater than 50%  respiratory variability, suggesting right atrial pressure of 3 mmHg.  __________   MRA chest 03/25/2023: IMPRESSION: Slight interval enlargement of a fusiform aneurysm of the ascending thoracic aorta with a maximal diameter of 4.8 cm compared to 4.6 cm on the prior study. Ascending thoracic aortic aneurysm. Recommend semi-annual imaging followup by CTA or MRA and referral to cardiothoracic surgery if not already obtained. __________   MRA chest 03/22/2022: IMPRESSION: 1. Stable 4.6 cm ascending thoracic aortic aneurysm without complicating features. Recommend semi-annual imaging followup by CTA or MRA and referral to cardiothoracic surgery if not already obtained. __________   Zio patch  02/2022:   The patient was monitored for 30 days.   The predominant rhythm was sinus with an average rate of 78 bpm (range 58-150 bpm in sinus).   Paroxysmal atrial fibrillation occurred with an average ventricular rate of 145 bpm (range 12-191 bpm).  The longest episode lasted 4:05 minutes.  Atrial fibrillation burden was less than 1%.   PVC's and rare  episodes of NSVT lasting up to 6 beats were observed.   There was no prolonged pause (greater than 3 seconds).   Sinus rhythm with paroxysmal atrial fibrillation as well as brief runs of NSVT.   Patient has already been evaluated in the office for finding of new atrial fibrillation in the setting of recent TIA. __________   2D echo 02/10/2022: 1. Left ventricular ejection fraction, by estimation, is 60 to 65%. The  left ventricle has normal function. The left ventricle has no regional  wall motion abnormalities. There is mild concentric left ventricular  hypertrophy. Left ventricular diastolic  parameters are indeterminate.   2. Right ventricular systolic function is normal. The right ventricular  size is normal. Tricuspid regurgitation signal is inadequate for assessing  PA pressure.   3. The mitral valve is normal in structure. Trivial mitral valve  regurgitation. No evidence of mitral stenosis.   4. The aortic valve is calcified. There is severe calcifcation of the  aortic valve. There is severe thickening of the aortic valve. Aortic valve  regurgitation is trivial. Mild aortic valve stenosis. Aortic valve area,  by VTI measures 1.57 cm. Aortic  valve mean gradient measures 15.0 mmHg. Aortic valve Vmax measures 2.45  m/s.   5. Aneurysm of the ascending aorta, measuring 49 mm. There is mild  dilatation of the aortic root, measuring 43 mm.   6. The inferior vena cava is normal in size with greater than 50%  respiratory variability, suggesting right atrial pressure of 3 mmHg.   7. Compared to prior echo the mean AVG has increased from  8.67mmHg to  and DVI has decreased from 0.49 to 0.38. __________   MRA chest 03/19/2021: IMPRESSION: Stable aneurysmal dilatation of the ascending thoracic aorta over the last several years with maximum diameter of approximately 4.7 cm. The aorta it does demonstrate very slight enlargement since 2010-2013 at which time the aorta measured approximately 4.4 cm. Recommend annual imaging followup by CTA or MRA. __________   2D echo 08/15/2020: 1. Left ventricular ejection fraction, by estimation, is 55 to 60%. The  left ventricle has normal function. The left ventricle has no regional  wall motion abnormalities. The left ventricular internal cavity size was  mildly dilated. Left ventricular  diastolic parameters are indeterminate.   2. Right ventricular systolic function is normal. The right ventricular  size is normal.   3. Left atrial size was mildly dilated.   4. The aortic valve was not well visualized. Unable to determine number  of leaflets. There is moderate calcification of the aortic valve. Aortic  valve regurgitation is not visualized. Mild to moderate aortic valve  sclerosis/calcification is present,  without any evidence of aortic stenosis.   5. Aortic dilatation noted. There is moderate dilatation of the aortic  root and of the ascending aorta, measuring 47 mm.   Comparison(s): Previous ascending aorta caliber reported as 46mm  Previous AV peak PG  Previous AV mean PG . __________   Zio patch 05/2020: The patient was monitored for 2 days, 20 hours. The predominant rhythm was sinus with an average rate of 76 bpm (range 58-120 bpm in sinus). There were frequent PAC's (15% burden) and PVC's (8.4% burden). 16 atrial runs occurred, lasting up to 12 minutes, 5 seconds, with a maximum rate of 158 bpm. First degree AV block was noted. There were no patient triggered symptoms.   Predominantly sinus rhythm with frequent PAC's and PVC's, as well as PSVT  lasting up to 12  minutes. __________   MRA chest 03/18/2020: IMPRESSION: Unchanged appearance of the thoracic aorta, with greatest diameter estimated 4.6 cm on the current MR. __________   2D echo 04/17/2019: 1. The left ventricle has low normal systolic function, with an ejection  fraction of 50-55%. The cavity size was normal. Left ventricular diastolic  Doppler parameters are consistent with impaired relaxation. Left ventrical  global hypokinesis without  regional wall motion abnormalities.   2. The right ventricle has normal systolic function. The cavity was  normal. There is no increase in right ventricular wall thickness. Right  ventricular systolic pressure could not be assessed.   3. Left atrial size was mildly dilated.   4. Right atrial size was mildly dilated.   5. The aortic valve is bicuspid. Moderate thickening of the aortic valve.  Mild calcification of the aortic valve. Aortic valve regurgitation is mild  by color flow Doppler. Mild stenosis of the aortic valve.   6. The aorta is abnormal unless otherwise noted.   7. There is moderate dilatation of the aortic root and of the ascending  aorta measuring 46 mm. __________   MRA chest 03/17/2019: IMPRESSION: VASCULAR   Slight interval enlargement of fusiform aneurysmal dilatation of the tubular portion of the ascending thoracic aorta with a maximal diameter of 4.7 cm (4.5 cm previously). __________   2D echo 02/06/2018: - Left ventricle: The cavity size was normal. Wall thickness was    normal. Systolic function was normal. The estimated ejection    fraction was in the range of 60% to 65%. Wall motion was normal;    there were no regional wall motion abnormalities. Features are    consistent with a pseudonormal left ventricular filling pattern,    with concomitant abnormal relaxation and increased filling    pressure (grade 2 diastolic dysfunction).  - Left atrium: The atrium was mildly dilated. __________   2D  echo 02/02/2017: - Left ventricle: The cavity size was normal. There was mild    concentric hypertrophy. Systolic function was normal. The    estimated ejection fraction was in the range of 55% to 60%. Wall    motion was normal; there were no regional wall motion    abnormalities. Left ventricular diastolic function parameters    were normal.  - Aortic valve: Trileaflet; moderately thickened, severely    calcified leaflets. There was mild regurgitation. Mean gradient    (S): 11 mm Hg. Valve area (VTI): 2.41 cm^2. Valve area (Vmax):    2.11 cm^2. Valve area (Vmean): 2.11 cm^2. Regurgitation pressure    half-time: 454 ms.  - Aorta: Aortic root dimension: 40 mm (ED). Ascending aorta    diameter: 45 mm (ED).  - Aortic root: The aortic root was mildly dilated. __________   2D echo 02/16/2016: - Left ventricle: The cavity size was mildly dilated. Wall    thickness was increased in a pattern of mild LVH. Systolic    function was normal. The estimated ejection fraction was in the    range of 50% to 55%. Features are consistent with a pseudonormal    left ventricular filling pattern, with concomitant abnormal    relaxation and increased filling pressure (grade 2 diastolic    dysfunction).  - Aortic valve: AV is thickened, moderately calcified with verily    mildly restricted mtoin. Peak and mean gradients through the    valve are 19 and 11 mm Hg respectively. There was mild    regurgitation. Valve area (VTI): 2.53 cm^2. Valve area (Vmax):  2.65 cm^2. Valve area (Vmean): 2.61 cm^2.  - Aorta: Proximal ascending aorta is mildly dilated at 44 mm  - Left atrium: The atrium was mildly dilated. __________   Cardiac MRI 02/18/2015: IMPRESSION: 1. Normal left ventricular size and systolic function, EF 55%. Normal right ventricular size and systolic function.   2. Bicuspid aortic valve with mild aortic stenosis and mild aortic insufficiency by regurgitant fraction (mild-moderate  insufficiency visually).   3. Mildly dilated ascending aorta to 4.4 cm. This is slightly less than on the prior study. __________   2D echo 02/12/2014: - Left ventricle: The cavity size was normal. There was moderate    concentric hypertrophy. Systolic function was normal. The    estimated ejection fraction was in the range of 55% to 60%. Wall    motion was normal; there were no regional wall motion    abnormalities. Left ventricular diastolic function parameters    were normal.  - Aortic valve: Bicuspid; moderately thickened, moderately    calcified leaflets. There was mild stenosis. There was mild to    moderate regurgitation.  - Aortic root: The aortic root was mildly dilated.  - Ascending aorta: The ascending aorta was normal in size.  - Mitral valve: There was no regurgitation.  - Right ventricle: Systolic function was normal.  - Tricuspid valve: There was mild regurgitation.  - Pulmonic valve: There was no regurgitation.  - Pulmonary arteries: Systolic pressure was within the normal    range.   Impressions:   - When compared to the study from 01/29/2014 there is no significant    change.    Normal biventricular size and function. Moderate LVH.    Bicuspid aortic valve with mild aortic stenosis and mild to    moderate aortic insufficiency.    Mildly dilated aortic root, normal sie ascending aorta. __________   2D echo 01/29/2013: - Left ventricle: The cavity size was normal. Wall thickness    was increased in a pattern of moderate LVH. Systolic    function was normal. The estimated ejection fraction was    in the range of 55% to 60%. Wall motion was normal; there    were no regional wall motion abnormalities. Left    ventricular diastolic function parameters were normal.  - Aortic valve: Bicuspid. There was very mild stenosis. Mild    regurgitation.  - Left atrium: The atrium was mildly dilated. __________   2D echo 02/01/2011: - Left ventricle: Wall thickness was  increased in a pattern of mild    LVH. Systolic function was normal. The estimated ejection fraction    was in the range of 55% to 60%.  - Aortic valve: Mild regurgitation. Valve area: 3.31cm^2(VTI). Valve    area: 2.91cm^2 (Vmax).  - Mitral valve: Mild regurgitation.  - Left atrium: The atrium was mildly dilated. __________   Cardiac MRI 01/28/2009: Impression:       1) Moderate ascending aortic root dilatation 4.6cm x 4.6cm   axially      2) Tri-leaflet aortic valve with fused right and non-coronary   commisure. Aortic Regurgiation is central. Suggest F/U echo's to   assess severity   3) Low normal EF 51% with mild LVE and abnormal septal motion   EKG:  EKG is ordered today.  The EKG ordered today demonstrates NSR, 62 bpm, first-degree AV block, incomplete RBBB, left anterior fascicular block, no acute ST-T changes, consistent with prior tracing  Recent Labs: 11/30/2023: ALT 23; Hemoglobin 16.2; Platelets 164 12/14/2023: BUN 21; Creatinine,  Ser 1.34; Potassium 4.5; Sodium 140; TSH 0.867  Recent Lipid Panel    Component Value Date/Time   CHOL 128 11/30/2023 0812   TRIG 97 11/30/2023 0812   HDL 41 11/30/2023 0812   CHOLHDL 3.1 11/30/2023 0812   VLDL 32 02/10/2022 0411   LDLCALC 69 11/30/2023 0812   LDLDIRECT 159.0 08/09/2007 1234    PHYSICAL EXAM:    VS:  BP 120/60 (BP Location: Left Arm, Patient Position: Sitting, Cuff Size: Normal)   Pulse 62   Ht 6' (1.829 m)   Wt 206 lb 6.4 oz (93.6 kg)   SpO2 99%   BMI 27.99 kg/m   BMI: Body mass index is 27.99 kg/m.  Physical Exam Vitals reviewed.  Constitutional:      Appearance: He is well-developed.  HENT:     Head: Normocephalic and atraumatic.  Eyes:     General:        Right eye: No discharge.        Left eye: No discharge.  Cardiovascular:     Rate and Rhythm: Normal rate and regular rhythm.     Pulses:          Posterior tibial pulses are 2+ on the right side and 2+ on the left side.     Heart sounds: S1 normal  and S2 normal. Heart sounds not distant. No midsystolic click and no opening snap. Murmur heard.     Harsh midsystolic murmur is present with a grade of 3/6 at the upper right sternal border radiating to the neck.     No friction rub.  Pulmonary:     Effort: Pulmonary effort is normal. No respiratory distress.     Breath sounds: Normal breath sounds. No decreased breath sounds, wheezing, rhonchi or rales.  Musculoskeletal:     Cervical back: Normal range of motion.     Right lower leg: No edema.     Left lower leg: No edema.  Skin:    General: Skin is warm and dry.     Nails: There is no clubbing.  Neurological:     Mental Status: He is alert and oriented to person, place, and time.  Psychiatric:        Speech: Speech normal.        Behavior: Behavior normal.        Thought Content: Thought content normal.        Judgment: Judgment normal.     Wt Readings from Last 3 Encounters:  06/01/24 206 lb 6.4 oz (93.6 kg)  02/29/24 205 lb (93 kg)  02/15/24 205 lb (93 kg)     ASSESSMENT & PLAN:   PAF: Maintaining sinus rhythm on Toprol -XL 25 mg.  CHA2DS2-VASc at least 6 (HTN, age x 1, DM, TIA x 2, vascular disease).  Remains on apixaban  5 mg twice daily and does not meet reduced dosing criteria.  No falls or symptoms concerning for bleeding.  Recent labs stable.  Bicuspid aortic valve with mild to moderate aortic stenosis: Echo in 03/2024 showed preserved LV systolic function with calcified aortic valve with moderate aortic stenosis with a mean gradient of 26 mmHg (previously 16.7 mmHg) and a valve area of 0.9 cm (previously 1.58 cm).  Follow-up echo in 2026 with timing to be determined based on updated MRI of the chest.  Ascending thoracic aortic aneurysm: MRI of the chest in 08/2023 showed stable aneurysmal dilatation of the ascending aorta with a maximal diameter of 4.8 cm with plans for follow-up imaging in  08/2024.  Followed by cardiothoracic surgery; needs to schedule follow up.   Maintain optimal blood pressure control.  If patient's aneurysm has progressed to meet surgical threshold, will need to pursue [facility] prior to surgical intervention.  HTN: Blood pressure is well-controlled in the office today.  He remains on lisinopril  5 mg and Toprol -XL 25 mg.  HLD: LDL 69 in 11/2023.  He remains on rosuvastatin  10 mg.     Disposition: F/u with Dr. Mady or an APP in 09/2024.   Medication Adjustments/Labs and Tests Ordered: Current medicines are reviewed at length with the patient today.  Concerns regarding medicines are outlined above. Medication changes, Labs and Tests ordered today are summarized above and listed in the Patient Instructions accessible in Encounters.   Signed, Bernardino Bring, PA-C 06/01/2024 10:05 AM     [facility] [place] [address] [place], [place] [address] [phone]

## 2024-06-01 ENCOUNTER — Encounter: Payer: Self-pay | Admitting: Physician Assistant

## 2024-06-01 ENCOUNTER — Ambulatory Visit: Attending: Physician Assistant | Admitting: Physician Assistant

## 2024-06-01 VITALS — BP 120/60 | HR 62 | Ht 72.0 in | Wt 206.4 lb

## 2024-06-01 DIAGNOSIS — I10 Essential (primary) hypertension: Secondary | ICD-10-CM

## 2024-06-01 DIAGNOSIS — E785 Hyperlipidemia, unspecified: Secondary | ICD-10-CM

## 2024-06-01 DIAGNOSIS — I7121 Aneurysm of the ascending aorta, without rupture: Secondary | ICD-10-CM

## 2024-06-01 DIAGNOSIS — Q2381 Bicuspid aortic valve: Secondary | ICD-10-CM | POA: Diagnosis not present

## 2024-06-01 DIAGNOSIS — I480 Paroxysmal atrial fibrillation: Secondary | ICD-10-CM | POA: Diagnosis not present

## 2024-06-01 DIAGNOSIS — I350 Nonrheumatic aortic (valve) stenosis: Secondary | ICD-10-CM | POA: Diagnosis not present

## 2024-06-01 NOTE — Patient Instructions (Signed)
 Medication Instructions:  Your physician recommends that you continue on your current medications as directed. Please refer to the Current Medication list given to you today.   *If you need a refill on your cardiac medications before your next appointment, please call your pharmacy*  Lab Work: None ordered at this time  If you have labs (blood work) drawn today and your tests are completely normal, you will receive your results only by: MyChart Message (if you have MyChart) OR A paper copy in the mail If you have any lab test that is abnormal or we need to change your treatment, we will call you to review the results.  Testing/Procedures: To further evaluate your thoracic aneurysm we'll move your MRA to January 2026  Follow-Up: At [facility], you and your health needs are our priority.  As part of our continuing mission to provide you with exceptional heart care, our providers are all part of one team.  This team includes your primary Cardiologist (physician) and Advanced Practice Providers or APPs (Physician Assistants and Nurse Practitioners) who all work together to provide you with the care you need, when you need it.  Your next appointment:   Feb 2026  Provider:   You may see Lonni Hanson, MD or Bernardino Bring, PA-C

## 2024-06-04 ENCOUNTER — Other Ambulatory Visit

## 2024-06-04 DIAGNOSIS — E1165 Type 2 diabetes mellitus with hyperglycemia: Secondary | ICD-10-CM

## 2024-06-04 DIAGNOSIS — E1169 Type 2 diabetes mellitus with other specified complication: Secondary | ICD-10-CM | POA: Diagnosis not present

## 2024-06-04 DIAGNOSIS — I10 Essential (primary) hypertension: Secondary | ICD-10-CM

## 2024-06-04 DIAGNOSIS — E785 Hyperlipidemia, unspecified: Secondary | ICD-10-CM | POA: Diagnosis not present

## 2024-06-07 ENCOUNTER — Ambulatory Visit: Admitting: Family Medicine

## 2024-06-07 ENCOUNTER — Encounter: Payer: Self-pay | Admitting: Family Medicine

## 2024-06-07 VITALS — BP 118/77 | HR 65 | Temp 98.2°F | Ht 72.0 in | Wt 206.0 lb

## 2024-06-07 DIAGNOSIS — Z0001 Encounter for general adult medical examination with abnormal findings: Secondary | ICD-10-CM | POA: Diagnosis not present

## 2024-06-07 DIAGNOSIS — Q2381 Bicuspid aortic valve: Secondary | ICD-10-CM | POA: Diagnosis not present

## 2024-06-07 DIAGNOSIS — I7121 Aneurysm of the ascending aorta, without rupture: Secondary | ICD-10-CM

## 2024-06-07 DIAGNOSIS — I480 Paroxysmal atrial fibrillation: Secondary | ICD-10-CM

## 2024-06-07 DIAGNOSIS — Z23 Encounter for immunization: Secondary | ICD-10-CM

## 2024-06-07 DIAGNOSIS — E1169 Type 2 diabetes mellitus with other specified complication: Secondary | ICD-10-CM | POA: Diagnosis not present

## 2024-06-07 DIAGNOSIS — I10 Essential (primary) hypertension: Secondary | ICD-10-CM

## 2024-06-07 DIAGNOSIS — E785 Hyperlipidemia, unspecified: Secondary | ICD-10-CM

## 2024-06-07 DIAGNOSIS — E1159 Type 2 diabetes mellitus with other circulatory complications: Secondary | ICD-10-CM

## 2024-06-07 DIAGNOSIS — E1165 Type 2 diabetes mellitus with hyperglycemia: Secondary | ICD-10-CM | POA: Diagnosis not present

## 2024-06-07 DIAGNOSIS — Z7984 Long term (current) use of oral hypoglycemic drugs: Secondary | ICD-10-CM

## 2024-06-07 DIAGNOSIS — Z0000 Encounter for general adult medical examination without abnormal findings: Secondary | ICD-10-CM | POA: Insufficient documentation

## 2024-06-07 NOTE — Assessment & Plan Note (Signed)
 Today your medical history was reviewed and routine physical exam with labs was performed. Recommend 150 minutes of moderate intensity exercise weekly and consuming a well-balanced diet. Advised to stop smoking if a smoker, avoid smoking if a non-smoker, limit alcohol consumption to 1 drink per day for women and 2 drinks per day for men, and avoid illicit drug use.  Vaccine maintenance discussed, flu and tdap administered today. Appropriate health maintenance items reviewed. Return to office in 1 year for annual physical exam. PSA in process  Health Maintenance  Topic Date Due   DTaP/Tdap/Td (3 - Td or Tdap) 02/20/2024   Influenza Vaccine  03/30/2024   Diabetic kidney evaluation - Urine ACR  06/02/2024   Medicare Annual Wellness (AWV)  06/15/2024   Hepatitis C Screening  06/07/2025 (Originally 01/14/1974)   OPHTHALMOLOGY EXAM  11/23/2024   HEMOGLOBIN A1C  12/03/2024   FOOT EXAM  02/14/2025   Diabetic kidney evaluation - eGFR measurement  06/04/2025   Colonoscopy  02/28/2029   Pneumococcal Vaccine: 50+ Years  Completed   Zoster Vaccines- Shingrix  Completed   Meningococcal B Vaccine  Aged Out   COVID-19 Vaccine  Discontinued

## 2024-06-07 NOTE — Assessment & Plan Note (Signed)
 Regular rate and rhythm today. Continue Eliquis and Metoprolol. Followed by Cardiology.

## 2024-06-07 NOTE — Progress Notes (Signed)
 Complete physical exam  Patient: Timothy Bernard   DOB: 03/20/1956   68 y.o. Male  MRN: [id number]  Subjective:    Chief Complaint  Patient presents with   Annual Exam    CPE  Tdap and flu today     Timothy Bernard is a 68 y.o. male who presents today for a complete physical exam. He reports consuming a general diet. Home exercise routine includes walking 1 hrs per day. He generally feels well. He reports sleeping well. He does not have additional problems to discuss today.   PMH significant for DM2, paroxysmal a-fib, TIA, HTN, HLD, thoracic aortic aneurysm, bicuspid aortic valve, and OSA. He is closely followed by Endocrinology and Cardiology and also sees an Ophthalmologist. Timothy Bernard had a colonoscopy completed this year with [facility] and had 2 benign polyps, will repeat in 5 years.  DM2: folowed by Endocrinology, on Invokana  300mg  daily, amaryl  1mg  daily Denies polyuria, polydypsia, chest pain, paresthesias, or chest pain. Lab Results  Component Value Date   HGBA1C 7.2 (H) 06/04/2024   HGBA1C 6.6 (A) 02/15/2024   HGBA1C 7.0 (H) 11/30/2023    HTN: followed by Cardiology, on Lisinopril  5mg  daily and Toprol -XL 25mg  daily Denies chest pain, palpitations, recurrent headaches, vision changes, lightheadedness, dizziness, dyspnea on exertion, or swelling of extremities.  HLD: on Rosuvastatin  10mg  daily Lipid Panel     Component Value Date/Time   CHOL 135 06/04/2024 0802   TRIG 141 06/04/2024 0802   HDL 41 06/04/2024 0802   CHOLHDL 3.3 06/04/2024 0802   VLDL 32 02/10/2022 0411   LDLCALC 72 06/04/2024 0802   LDLDIRECT 159.0 08/09/2007 1234  The ASCVD Risk score (Arnett DK, et al., 2019) failed to calculate for the following reasons:   Risk score cannot be calculated because patient has a medical history suggesting prior/existing ASCVD  Paroxysmal a-fib: followed by Cardiology, on Toprol -XL 25mg  daily and Eliquis  5mg  BD  Thoracic aortic aneurysm: 08/2023 MRA stable at  4.8cm, follow up 08/2024   Bicuspid aortic valve: EF 55-60% on stable ECHO 04/09/2024, repeat annually  OSA: not using CPAP     Most recent fall risk assessment:    06/07/2024    9:06 AM  Fall Risk   Falls in the past year? 0  Number falls in past yr: 0  Injury with Fall? 0  Risk for fall due to : No Fall Risks  Follow up Falls evaluation completed     Most recent depression screenings:    06/07/2024    9:06 AM 12/05/2023    7:59 AM  PHQ 2/9 Scores  PHQ - 2 Score 0 0  PHQ- 9 Score 0     Vision:Within last year, Dental: No current dental problems and Receives regular dental care, and PSA: Prostate cancer screening and PSA options (with potential risks and benefits of testing vs. not testing) were discussed along with recent recs/guidelines.   Patient Active Problem List   Diagnosis Date Noted   Physical exam, annual 06/07/2024   Stroke [facility])    Paroxysmal atrial fibrillation (HCC) 06/04/2022   First degree AV block 06/04/2022   Type 2 diabetes mellitus with hyperglycemia, without long-term current use of insulin  (HCC) 04/29/2022   History of TIA (transient ischemic attack) 02/09/2022   Mild aortic stenosis 04/27/2019   Erectile dysfunction 09/07/2017   Bicuspid aortic valve 05/07/2017   History of colonic polyps 05/20/2014   Aortic valve disorder 05/20/2009   Thoracic aortic aneurysm without rupture 01/20/2009  Hyperlipidemia associated with type 2 diabetes mellitus (HCC) 08/09/2007   Essential hypertension 08/09/2007   Past Medical History:  Diagnosis Date   Adenomatous colon polyp 09/29/2007   12mm cecal adenomatous polyp 10 mm adenoama, post-polypectomy burn syndrome   Ascending aortic aneurysm    4.8 cm on ech (5/10), 4.6 x 4.6 cm by MRA 6/10 4.6 cm aortic root and ascending aorta by MRA 9/10. MRA chest (3/11) 4.5 ascending aorta. Stable by MRA 6/18   Bicuspid aortic valve    echo (5/10) with bicuspid AoV (upon re-review), mild LV dilation, EF 50-55%, no aortic  stenosis, mild AI, moderate ascending aortic dilation (4.8 cm). 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 Echo (3/11) bicuspid aortic valve with no AS but mild AI, mild LVH, EF 60%.     Blood transfusion without reported diagnosis    Cataract    Detached retina, right 2023   HTN (hypertension)    Hyperlipidemia    NIDDM (non-insulin  dependent diabetes mellitus) 07/2011   OSA on CPAP    Paroxysmal atrial fibrillation (HCC) 02/2022   Stroke (HCC)    TIA (transient ischemic attack) 01/2022   Past Surgical History:  Procedure Laterality Date   CATARACT EXTRACTION Bilateral    COLONOSCOPY W/ BIOPSIES AND POLYPECTOMY  09/29/2007   EYE SURGERY     Mole removed     Social History   Tobacco Use   Smoking status: Never    Passive exposure: Past (as a child)   Smokeless tobacco: Never  Vaping Use   Vaping status: Never Used  Substance Use Topics   Alcohol use: No    Alcohol/week: 0.0 standard drinks of alcohol   Drug use: No   Family History  Problem Relation Age of Onset   Hypertension Mother    Diabetes Mother    Cancer Father    Coronary artery disease Maternal Grandfather    Hypertension Other        maternal side   Colon cancer Neg Hx    Prostate cancer Neg Hx    Esophageal cancer Neg Hx    Liver cancer Neg Hx    Pancreatic cancer Neg Hx    Stomach cancer Neg Hx    Rectal cancer Neg Hx    No Known Allergies    Patient Care Team: Kayla Jeoffrey RAMAN, FNP as PCP - General (Family Medicine) End, Lonni, MD as PCP - Cardiology (Cardiology)   Outpatient Medications Prior to Visit  Medication Sig   apixaban  (ELIQUIS ) 5 MG TABS tablet TAKE 1 TABLET TWICE DAILY   canagliflozin  (INVOKANA ) 300 MG TABS tablet Take 1 tablet (300 mg total) by mouth daily before breakfast.   Flaxseed, Linseed, (FLAX SEED OIL) 1000 MG CAPS Take 1 capsule by mouth daily.   glimepiride  (AMARYL ) 1 MG tablet Take 1 tablet (1 mg total) by mouth daily before breakfast.   lisinopril  (ZESTRIL ) 5 MG tablet  Take 1 tablet (5 mg total) by mouth daily.   metoprolol  succinate (TOPROL -XL) 25 MG 24 hr tablet Take 1 tablet (25 mg total) by mouth daily.   rosuvastatin  (CRESTOR ) 10 MG tablet TAKE 1 TABLET EVERY DAY   TRUE METRIX BLOOD GLUCOSE TEST test strip TEST BLOOD SUGAR ONE TIME DAILY   No facility-administered medications prior to visit.    Review of Systems  Constitutional: Negative.   HENT:  Positive for tinnitus (left).   Eyes: Negative.   Respiratory: Negative.    Cardiovascular: Negative.   Gastrointestinal: Negative.   Genitourinary: Negative.  Musculoskeletal: Negative.   Skin: Negative.   Neurological: Negative.   Endo/Heme/Allergies: Negative.   Psychiatric/Behavioral: Negative.    All other systems reviewed and are negative.         Objective:     BP 118/77   Pulse 65   Temp 98.2 F (36.8 C)   Ht 6' (1.829 m)   Wt 206 lb 0.6 oz (93.5 kg)   SpO2 98%   BMI 27.94 kg/m  BP Readings from Last 3 Encounters:  06/07/24 118/77  06/01/24 120/60  02/29/24 122/74   Wt Readings from Last 3 Encounters:  06/07/24 206 lb 0.6 oz (93.5 kg)  06/01/24 206 lb 6.4 oz (93.6 kg)  02/29/24 205 lb (93 kg)      Physical Exam Vitals and nursing note reviewed.  Constitutional:      Appearance: Normal appearance. He is normal weight.  HENT:     Head: Normocephalic and atraumatic.     Right Ear: Tympanic membrane, ear canal and external ear normal.     Left Ear: Tympanic membrane, ear canal and external ear normal.     Nose: Nose normal.     Mouth/Throat:     Mouth: Mucous membranes are moist.     Pharynx: Oropharynx is clear.  Eyes:     Extraocular Movements: Extraocular movements intact.     Right eye: Normal extraocular motion and no nystagmus.     Left eye: Normal extraocular motion and no nystagmus.     Conjunctiva/sclera: Conjunctivae normal.     Pupils: Pupils are equal, round, and reactive to light.  Cardiovascular:     Rate and Rhythm: Normal rate and regular  rhythm.     Pulses: Normal pulses.     Heart sounds: Murmur heard.  Pulmonary:     Effort: Pulmonary effort is normal.     Breath sounds: Normal breath sounds.  Abdominal:     General: Bowel sounds are normal.     Palpations: Abdomen is soft.  Genitourinary:    Comments: Deferred using shared decision making Musculoskeletal:        General: Normal range of motion.     Cervical back: Normal range of motion and neck supple.  Skin:    General: Skin is warm and dry.     Capillary Refill: Capillary refill takes less than 2 seconds.  Neurological:     General: No focal deficit present.     Mental Status: He is alert. Mental status is at baseline.  Psychiatric:        Mood and Affect: Mood normal.        Speech: Speech normal.        Behavior: Behavior normal.        Thought Content: Thought content normal.        Cognition and Memory: Cognition and memory normal.        Judgment: Judgment normal.      No results found for any visits on 06/07/24. Last CBC Lab Results  Component Value Date   WBC 9.3 06/04/2024   HGB 16.3 06/04/2024   HCT 49.7 06/04/2024   MCV 93.2 06/04/2024   MCH 30.6 06/04/2024   RDW 13.0 06/04/2024   PLT 169 06/04/2024   Last metabolic panel Lab Results  Component Value Date   GLUCOSE 128 (H) 06/04/2024   NA 141 06/04/2024   K 4.4 06/04/2024   CL 107 06/04/2024   CO2 28 06/04/2024   BUN 16 06/04/2024   CREATININE 1.28 06/04/2024  EGFR 61 06/04/2024   CALCIUM  9.3 06/04/2024   PHOS 3.1 05/29/2021   PROT 7.1 06/04/2024   ALBUMIN 4.1 02/09/2022   BILITOT 0.5 06/04/2024   ALKPHOS 63 02/09/2022   AST 19 06/04/2024   ALT 20 06/04/2024   ANIONGAP 9 04/02/2022   Last lipids Lab Results  Component Value Date   CHOL 135 06/04/2024   HDL 41 06/04/2024   LDLCALC 72 06/04/2024   LDLDIRECT 159.0 08/09/2007   TRIG 141 06/04/2024   CHOLHDL 3.3 06/04/2024   Last hemoglobin A1c Lab Results  Component Value Date   HGBA1C 7.2 (H) 06/04/2024   Last  thyroid  functions Lab Results  Component Value Date   TSH 0.867 12/14/2023   Last vitamin D No results found for: 25OHVITD2, 25OHVITD3, VD25OH Last vitamin B12 and Folate No results found for: VITAMINB12, FOLATE      Assessment & Plan:    Routine Health Maintenance and Physical Exam  Immunization History  Administered Date(s) Administered   Fluad Quad(high Dose 65+) 06/02/2022   Fluad Trivalent(High Dose 65+) 06/06/2023   INFLUENZA, HIGH DOSE SEASONAL PF 06/07/2024   Influenza Split 06/30/2011, 07/14/2012, 06/18/2013   Influenza Whole 05/30/2009   Influenza, Seasonal, Injecte, Preservative Fre 06/29/2016   Influenza,inj,Quad PF,6+ Mos 08/03/2013, 05/24/2019, 05/10/2020, 05/29/2021   Influenza-Unspecified 06/08/2014, 05/30/2017, 05/30/2018   PFIZER(Purple Top)SARS-COV-2 Vaccination 11/09/2019, 12/03/2019, 06/28/2020   Pneumococcal Conjugate-13 09/22/2016   Pneumococcal Polysaccharide-23 07/14/2012, 05/29/2021   Td 03/07/2003   Tdap 02/19/2014, 06/07/2024   Zoster Recombinant(Shingrix) 03/27/2019, 07/14/2019    Health Maintenance  Topic Date Due   Diabetic kidney evaluation - Urine ACR  06/02/2024   Medicare Annual Wellness (AWV)  06/15/2024   Hepatitis C Screening  06/07/2025 (Originally 01/14/1974)   OPHTHALMOLOGY EXAM  11/23/2024   HEMOGLOBIN A1C  12/03/2024   FOOT EXAM  02/14/2025   Diabetic kidney evaluation - eGFR measurement  06/04/2025   Colonoscopy  02/28/2029   DTaP/Tdap/Td (4 - Td or Tdap) 06/07/2034   Pneumococcal Vaccine: 50+ Years  Completed   Influenza Vaccine  Completed   Zoster Vaccines- Shingrix  Completed   Meningococcal B Vaccine  Aged Out   COVID-19 Vaccine  Discontinued    Discussed health benefits of physical activity, and encouraged him to engage in regular exercise appropriate for his age and condition.  Problem List Items Addressed This Visit     Hyperlipidemia associated with type 2 diabetes mellitus (HCC)   Continue Crestor   10mg  daily. I recommend consuming a heart healthy diet such as Mediterranean diet or DASH diet with whole grains, fruits, vegetable, fish, lean meats, nuts, and olive oil. Limit sweets and processed foods. I also encourage moderate intensity exercise 150 minutes weekly. This is 3-5 times weekly for 30-50 minutes each session. Goal should be pace of 3 miles/hours, or walking 1.5 miles in 30 minutes.      Essential hypertension   Followed by Cardiology. Continue Lisinopril  5mg  daily and Metoprolol  XL 25mg  daily. Recommend heart healthy diet such as Mediterranean diet with whole grains, fruits, vegetable, fish, lean meats, nuts, and olive oil. Limit salt. Encouraged moderate walking, 3-5 times/week for 30-50 minutes each session. Aim for at least 150 minutes.week. Goal should be pace of 3 miles/hours, or walking 1.5 miles in 30 minutes. Avoid tobacco products. Avoid excess alcohol. Take medications as prescribed and bring medications and blood pressure log with cuff to each office visit. Seek medical care for chest pain, palpitations, shortness of breath with exertion, dizziness/lightheadedness, vision changes, recurrent headaches, or  swelling of extremities. Follow up in 6 months      Thoracic aortic aneurysm without rupture   Followed by cardiology 08/2023 MRA stable at 4.8cm, follow up 08/2024       Bicuspid aortic valve   EF 55-60% on stable ECHO 04/09/2024, repeat annually Followed by cardiology.       RESOLVED: Type 2 diabetes mellitus with circulatory disorder (HCC)   Type 2 diabetes mellitus with hyperglycemia, without long-term current use of insulin  (HCC)   A1c 7.2%. Followed by Endo. Continue Invokana  300mg  daily, Amaryl  1mg  daily. A1c and uACR UTD. Foot exam UTD. Vaccines UTD. Retinal eye exam UTD. Recommend heart healthy diet such as Mediterranean diet with whole grains, fruits, vegetable, fish, lean meats, nuts, and olive oil. Limit salt. Encouraged moderate walking, 3-5 times/week for  30-50 minutes each session. Aim for at least 150 minutes.week. Goal should be pace of 3 miles/hours, or walking 1.5 miles in 30 minutes. Seek medical care for urinary frequency, extreme thirst, vision changes, lightheadedness, dizziness.  Follow up in 6 months or sooner if needed      Relevant Orders   Microalbumin / creatinine urine ratio   Paroxysmal atrial fibrillation (HCC)   Regular rate and rhythm today. Continue Eliquis  and Metoprolol . Followed by Cardiology.      Physical exam, annual - Primary   Today your medical history was reviewed and routine physical exam with labs was performed. Recommend 150 minutes of moderate intensity exercise weekly and consuming a well-balanced diet. Advised to stop smoking if a smoker, avoid smoking if a non-smoker, limit alcohol consumption to 1 drink per day for women and 2 drinks per day for men, and avoid illicit drug use.  Vaccine maintenance discussed, flu and tdap administered today. Appropriate health maintenance items reviewed. Return to office in 1 year for annual physical exam. PSA in process  Health Maintenance  Topic Date Due   DTaP/Tdap/Td (3 - Td or Tdap) 02/20/2024   Influenza Vaccine  03/30/2024   Diabetic kidney evaluation - Urine ACR  06/02/2024   Medicare Annual Wellness (AWV)  06/15/2024   Hepatitis C Screening  06/07/2025 (Originally 01/14/1974)   OPHTHALMOLOGY EXAM  11/23/2024   HEMOGLOBIN A1C  12/03/2024   FOOT EXAM  02/14/2025   Diabetic kidney evaluation - eGFR measurement  06/04/2025   Colonoscopy  02/28/2029   Pneumococcal Vaccine: 50+ Years  Completed   Zoster Vaccines- Shingrix  Completed   Meningococcal B Vaccine  Aged Out   COVID-19 Vaccine  Discontinued         Other Visit Diagnoses       Need for vaccination       Relevant Orders   Tdap vaccine greater than or equal to 7yo IM (Completed)   Flu vaccine HIGH DOSE PF(Fluzone Trivalent) (Completed)      Return in about 6 months (around 12/06/2024) for  chronic follow-up with labs 1 week prior.     Jeoffrey GORMAN Barrio, FNP

## 2024-06-07 NOTE — Assessment & Plan Note (Signed)
 Followed by cardiology 08/2023 MRA stable at 4.8cm, follow up 08/2024

## 2024-06-07 NOTE — Assessment & Plan Note (Signed)
 Continue Crestor  10mg  daily. I recommend consuming a heart healthy diet such as Mediterranean diet or DASH diet with whole grains, fruits, vegetable, fish, lean meats, nuts, and olive oil. Limit sweets and processed foods. I also encourage moderate intensity exercise 150 minutes weekly. This is 3-5 times weekly for 30-50 minutes each session. Goal should be pace of 3 miles/hours, or walking 1.5 miles in 30 minutes.

## 2024-06-07 NOTE — Assessment & Plan Note (Signed)
 Followed by Cardiology. Continue Lisinopril  5mg  daily and Metoprolol  XL 25mg  daily. Recommend heart healthy diet such as Mediterranean diet with whole grains, fruits, vegetable, fish, lean meats, nuts, and olive oil. Limit salt. Encouraged moderate walking, 3-5 times/week for 30-50 minutes each session. Aim for at least 150 minutes.week. Goal should be pace of 3 miles/hours, or walking 1.5 miles in 30 minutes. Avoid tobacco products. Avoid excess alcohol. Take medications as prescribed and bring medications and blood pressure log with cuff to each office visit. Seek medical care for chest pain, palpitations, shortness of breath with exertion, dizziness/lightheadedness, vision changes, recurrent headaches, or swelling of extremities. Follow up in 6 months

## 2024-06-07 NOTE — Assessment & Plan Note (Signed)
 A1c 7.2%. Followed by Endo. Continue Invokana  300mg  daily, Amaryl  1mg  daily. A1c and uACR UTD. Foot exam UTD. Vaccines UTD. Retinal eye exam UTD. Recommend heart healthy diet such as Mediterranean diet with whole grains, fruits, vegetable, fish, lean meats, nuts, and olive oil. Limit salt. Encouraged moderate walking, 3-5 times/week for 30-50 minutes each session. Aim for at least 150 minutes.week. Goal should be pace of 3 miles/hours, or walking 1.5 miles in 30 minutes. Seek medical care for urinary frequency, extreme thirst, vision changes, lightheadedness, dizziness.  Follow up in 6 months or sooner if needed

## 2024-06-07 NOTE — Assessment & Plan Note (Signed)
 EF 55-60% on stable ECHO 04/09/2024, repeat annually Followed by cardiology.

## 2024-06-08 LAB — COMPREHENSIVE METABOLIC PANEL WITH GFR
AG Ratio: 1.5 (calc) (ref 1.0–2.5)
ALT: 20 U/L (ref 9–46)
AST: 19 U/L (ref 10–35)
Albumin: 4.3 g/dL (ref 3.6–5.1)
Alkaline phosphatase (APISO): 60 U/L (ref 35–144)
BUN: 16 mg/dL (ref 7–25)
CO2: 28 mmol/L (ref 20–32)
Calcium: 9.3 mg/dL (ref 8.6–10.3)
Chloride: 107 mmol/L (ref 98–110)
Creat: 1.28 mg/dL (ref 0.70–1.35)
Globulin: 2.8 g/dL (ref 1.9–3.7)
Glucose, Bld: 128 mg/dL — ABNORMAL HIGH (ref 65–99)
Potassium: 4.4 mmol/L (ref 3.5–5.3)
Sodium: 141 mmol/L (ref 135–146)
Total Bilirubin: 0.5 mg/dL (ref 0.2–1.2)
Total Protein: 7.1 g/dL (ref 6.1–8.1)
eGFR: 61 mL/min/1.73m2 (ref >=60)

## 2024-06-08 LAB — CBC WITH DIFFERENTIAL/PLATELET
Absolute Lymphocytes: 3218 {cells}/uL (ref 850–3900)
Absolute Monocytes: 521 {cells}/uL (ref 200–950)
Basophils Absolute: 37 {cells}/uL (ref 0–200)
Basophils Relative: 0.4 %
Eosinophils Absolute: 102 {cells}/uL (ref 15–500)
Eosinophils Relative: 1.1 %
HCT: 49.7 % (ref 38.5–50.0)
Hemoglobin: 16.3 g/dL (ref 13.2–17.1)
MCH: 30.6 pg (ref 27.0–33.0)
MCHC: 32.8 g/dL (ref 32.0–36.0)
MCV: 93.2 fL (ref 80.0–100.0)
MPV: 10.6 fL (ref 7.5–12.5)
Monocytes Relative: 5.6 %
Neutro Abs: 5422 {cells}/uL (ref 1500–7800)
Neutrophils Relative %: 58.3 %
Platelets: 169 Thousand/uL (ref 140–400)
RBC: 5.33 Million/uL (ref 4.20–5.80)
RDW: 13 % (ref 11.0–15.0)
Total Lymphocyte: 34.6 %
WBC: 9.3 Thousand/uL (ref 3.8–10.8)

## 2024-06-08 LAB — LIPID PANEL
Cholesterol: 135 mg/dL (ref <200)
HDL: 41 mg/dL (ref >=40)
LDL Cholesterol (Calc): 72 mg/dL
Non-HDL Cholesterol (Calc): 94 mg/dL (ref <130)
Total CHOL/HDL Ratio: 3.3 (calc) (ref <5.0)
Triglycerides: 141 mg/dL (ref <150)

## 2024-06-08 LAB — HEMOGLOBIN A1C
Hgb A1c MFr Bld: 7.2 % — ABNORMAL HIGH (ref <5.7)
Mean Plasma Glucose: 160 mg/dL
eAG (mmol/L): 8.9 mmol/L

## 2024-06-08 LAB — TEST AUTHORIZATION

## 2024-06-08 LAB — MICROALBUMIN / CREATININE URINE RATIO
Creatinine, Urine: 61 mg/dL (ref 20–320)
Microalb Creat Ratio: 7 mg/g{creat} (ref <30)
Microalb, Ur: 0.4 mg/dL

## 2024-06-08 LAB — PSA: PSA: 2.47 ng/mL (ref <=4.00)

## 2024-06-11 ENCOUNTER — Ambulatory Visit: Payer: Self-pay | Admitting: Family Medicine

## 2024-06-11 ENCOUNTER — Other Ambulatory Visit: Payer: Self-pay | Admitting: Internal Medicine

## 2024-06-18 ENCOUNTER — Encounter: Payer: Self-pay | Admitting: Family Medicine

## 2024-06-18 ENCOUNTER — Ambulatory Visit (INDEPENDENT_AMBULATORY_CARE_PROVIDER_SITE_OTHER): Admitting: Family Medicine

## 2024-06-18 VITALS — BP 142/84 | HR 63 | Temp 97.7°F | Ht 72.0 in | Wt 205.4 lb

## 2024-06-18 DIAGNOSIS — Z0000 Encounter for general adult medical examination without abnormal findings: Secondary | ICD-10-CM
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
## 2024-06-18 NOTE — Patient Instructions (Signed)
  Timothy Bernard , Thank you for taking time to come for your Medicare Wellness Visit. I appreciate your ongoing commitment to your health goals. Please review the following plan we discussed and let me know if I can assist you in the future.   These are the goals we discussed:  Goals      Maintain or improve exercise capacity (6 minute walk)     Patient is initiating therapy. Patient will work on increased adherence pt. States he is walking two miles a day and wants to maintain that stature.       Remain active and independent        This is a list of the screening recommended for you and due dates:  Health Maintenance  Topic Date Due   Hepatitis C Screening  06/07/2025*   Eye exam for diabetics  11/23/2024   Hemoglobin A1C  12/03/2024   Complete foot exam   02/14/2025   Yearly kidney function blood test for diabetes  06/04/2025   Yearly kidney health urinalysis for diabetes  06/07/2025   Medicare Annual Wellness Visit  06/18/2025   Colon Cancer Screening  02/28/2029   DTaP/Tdap/Td vaccine (4 - Td or Tdap) 06/07/2034   Pneumococcal Vaccine for age over 71  Completed   Flu Shot  Completed   Zoster (Shingles) Vaccine  Completed   Meningitis B Vaccine  Aged Out   COVID-19 Vaccine  Discontinued  *Topic was postponed. The date shown is not the original due date.

## 2024-06-21 ENCOUNTER — Ambulatory Visit: Payer: Medicare HMO

## 2024-07-02 DIAGNOSIS — Z125 Encounter for screening for malignant neoplasm of prostate: Secondary | ICD-10-CM | POA: Diagnosis not present

## 2024-07-09 DIAGNOSIS — N5201 Erectile dysfunction due to arterial insufficiency: Secondary | ICD-10-CM | POA: Diagnosis not present

## 2024-07-09 DIAGNOSIS — Z125 Encounter for screening for malignant neoplasm of prostate: Secondary | ICD-10-CM | POA: Diagnosis not present

## 2024-08-22 ENCOUNTER — Other Ambulatory Visit: Payer: Self-pay | Admitting: Internal Medicine

## 2024-08-22 ENCOUNTER — Other Ambulatory Visit: Payer: Self-pay | Admitting: Physician Assistant

## 2024-08-22 DIAGNOSIS — I480 Paroxysmal atrial fibrillation: Secondary | ICD-10-CM

## 2024-08-22 NOTE — Telephone Encounter (Signed)
 Prescription refill request for Eliquis  received. Indication:afib Last office visit:10/25 Scr: 1.28  10/25 Age:68 Weight:93.2  kg  Prescription refilled

## 2024-08-24 ENCOUNTER — Other Ambulatory Visit: Payer: Self-pay

## 2024-08-24 ENCOUNTER — Telehealth: Payer: Self-pay | Admitting: Family Medicine

## 2024-08-24 MED ORDER — TRUE METRIX BLOOD GLUCOSE TEST VI STRP: ORAL_STRIP | 3 refills | Status: AC

## 2024-08-24 NOTE — Telephone Encounter (Signed)
 Prescription Request  08/24/2024  LOV: 06/07/2024  What is the name of the medication or equipment? [employer] TRUE METRIX BLOOD GLUCOSE TEST test strip   Have you contacted your pharmacy to request a refill? Yes   Which pharmacy would you like this sent to?  [facility] - [place], [place] - [address] [place] [address] Phone: [phone] Fax: [phone]    Patient notified that their request is being sent to the clinical staff for review and that they should receive a response within 2 business days.   Please advise at [facility] [phone]

## 2024-08-24 NOTE — Telephone Encounter (Signed)
 Sent in medication

## 2024-08-29 ENCOUNTER — Ambulatory Visit: Admitting: Internal Medicine

## 2024-09-03 ENCOUNTER — Ambulatory Visit: Admitting: Internal Medicine

## 2024-09-03 ENCOUNTER — Encounter: Payer: Self-pay | Admitting: Internal Medicine

## 2024-09-03 VITALS — BP 130/80 | Ht 72.0 in | Wt 204.0 lb

## 2024-09-03 DIAGNOSIS — Z7984 Long term (current) use of oral hypoglycemic drugs: Secondary | ICD-10-CM | POA: Diagnosis not present

## 2024-09-03 DIAGNOSIS — E1159 Type 2 diabetes mellitus with other circulatory complications: Secondary | ICD-10-CM | POA: Diagnosis not present

## 2024-09-03 LAB — POCT GLYCOSYLATED HEMOGLOBIN (HGB A1C): Hemoglobin A1C: 6.7 % — AB (ref 4.0–5.6)

## 2024-09-03 MED ORDER — INVOKANA 300 MG PO TABS: 300.0000 mg | ORAL_TABLET | Freq: Every day | ORAL | 3 refills | Status: DC

## 2024-09-03 MED ORDER — GLIMEPIRIDE 1 MG PO TABS: 1.0000 mg | ORAL_TABLET | Freq: Every day | ORAL | 3 refills | Status: AC
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
## 2024-09-03 NOTE — Patient Instructions (Signed)
 Continue Glimepiride  1 mg , 1 tablet before Breakfast  Continue Invokana  300 mg daily      HOW TO TREAT LOW BLOOD SUGARS (Blood sugar LESS THAN 70 MG/DL) Please follow the RULE OF 15 for the treatment of hypoglycemia treatment (when your (blood sugars are less than 70 mg/dL)   STEP 1: Take 15 grams of carbohydrates when your blood sugar is low, which includes:  3-4 GLUCOSE TABS  OR 3-4 OZ OF JUICE OR REGULAR SODA OR ONE TUBE OF GLUCOSE GEL    STEP 2: RECHECK blood sugar in 15 MINUTES STEP 3: If your blood sugar is still low at the 15 minute recheck --> then, go back to STEP 1 and treat AGAIN with another 15 grams of carbohydrates.

## 2024-09-10 ENCOUNTER — Other Ambulatory Visit (HOSPITAL_COMMUNITY)

## 2024-09-10 ENCOUNTER — Ambulatory Visit
Admission: RE | Admit: 2024-09-10 | Discharge: 2024-09-10 | Disposition: A | Discharge status: Home / Self Care | Source: Ambulatory Visit | Attending: Physician Assistant | Admitting: Physician Assistant

## 2024-09-10 DIAGNOSIS — I7121 Aneurysm of the ascending aorta, without rupture: Secondary | ICD-10-CM | POA: Insufficient documentation

## 2024-09-10 MED ORDER — GADOBUTROL 1 MMOL/ML IV SOLN
9.0000 mL | Freq: Once | INTRAVENOUS | Status: AC | PRN
  Administered 2024-09-10: 9 mL via INTRAVENOUS

## 2024-09-10 NOTE — Addendum Note (Signed)
 Addended by: HANNAH MARCUS KIDD on: 09/10/2024 03:25 PM   Modules accepted: Orders

## 2024-09-18 ENCOUNTER — Telehealth: Payer: Self-pay

## 2024-09-18 NOTE — Telephone Encounter (Signed)
 Pharmacy called stating Invokana  was not covered by patient's insurance. Requesting a PA or change to alternative that is covered which is Farxiga  or Jardiance.

## 2024-09-19 ENCOUNTER — Ambulatory Visit: Admitting: Surgery

## 2024-09-19 ENCOUNTER — Encounter: Payer: Self-pay | Admitting: Surgery

## 2024-09-19 VITALS — BP 138/88 | HR 79 | Resp 20 | Ht 72.0 in | Wt 209.0 lb

## 2024-09-19 DIAGNOSIS — I7121 Aneurysm of the ascending aorta, without rupture: Secondary | ICD-10-CM

## 2024-09-19 NOTE — Progress Notes (Unsigned)
" ° °  [address], [place] [address]             [phone]    HPI: ***  Current Outpatient Medications  Medication Sig Dispense Refill   canagliflozin  (INVOKANA ) 300 MG TABS tablet Take 1 tablet (300 mg total) by mouth daily before breakfast. 90 tablet 3   ELIQUIS  5 MG TABS tablet TAKE 1 TABLET TWICE DAILY 180 tablet 3   Flaxseed, Linseed, (FLAX SEED OIL) 1000 MG CAPS Take 1 capsule by mouth daily.     glimepiride  (AMARYL ) 1 MG tablet Take 1 tablet (1 mg total) by mouth daily with breakfast. 90 tablet 3   glucose blood (TRUE METRIX BLOOD GLUCOSE TEST) test strip TEST BLOOD SUGAR ONE TIME DAILY 100 strip 3   lisinopril  (ZESTRIL ) 5 MG tablet Take 1 tablet (5 mg total) by mouth daily. 90 tablet 3   metoprolol  succinate (TOPROL -XL) 25 MG 24 hr tablet TAKE 1 TABLET EVERY DAY 90 tablet 2   rosuvastatin  (CRESTOR ) 10 MG tablet TAKE 1 TABLET EVERY DAY 90 tablet 3   No current facility-administered medications for this visit.     Physical Exam: ***  Diagnostic Tests: ***  Impression: ***  Plan: ***   Dorise MARLA Fellers, MD [facility] [phone]       "

## 2024-09-20 ENCOUNTER — Other Ambulatory Visit (HOSPITAL_COMMUNITY): Payer: Self-pay

## 2024-09-21 ENCOUNTER — Telehealth: Payer: Self-pay

## 2024-09-21 NOTE — Telephone Encounter (Signed)
 Pharmacy Patient Advocate Encounter  Received notification from [employer] that Prior Authorization for Invokana  has been DENIED.  Full denial letter will be uploaded to the media tab. See denial reason below.

## 2024-09-21 NOTE — Telephone Encounter (Unsigned)
 Pharmacy Patient Advocate Encounter   Received notification from Pt Calls Messages that prior authorization for Invokana  is required/requested.   Insurance verification completed.   The patient is insured through [employer].   Per test claim: {copay/ins/PA:29915}

## 2024-09-24 NOTE — Telephone Encounter (Signed)
 Invokana  is not covered by the patients insurance and will need to be switched to Jardiance , which is the preferred formulary option. A trial of Jardiance  is required unless there is documented clinical justification for Invokana , including reasons why alternative covered medications are not appropriate or were previously ineffective

## 2024-09-24 NOTE — Telephone Encounter (Signed)
 Message sent to provider on another message indication of Jardiance  required.

## 2024-09-25 ENCOUNTER — Other Ambulatory Visit: Payer: Self-pay | Admitting: Internal Medicine

## 2024-09-25 MED ORDER — EMPAGLIFLOZIN 25 MG PO TABS: 25.0000 mg | ORAL_TABLET | Freq: Every day | ORAL | 3 refills | Status: AC

## 2024-10-04 ENCOUNTER — Encounter: Payer: Self-pay | Admitting: Physician Assistant

## 2024-10-04 ENCOUNTER — Ambulatory Visit: Admitting: Physician Assistant

## 2024-10-04 VITALS — BP 114/68 | HR 70 | Ht 72.0 in | Wt 203.0 lb

## 2024-10-04 DIAGNOSIS — I480 Paroxysmal atrial fibrillation: Secondary | ICD-10-CM | POA: Diagnosis not present

## 2024-10-04 DIAGNOSIS — Q2381 Bicuspid aortic valve: Secondary | ICD-10-CM

## 2024-10-04 DIAGNOSIS — I7121 Aneurysm of the ascending aorta, without rupture: Secondary | ICD-10-CM | POA: Diagnosis not present

## 2024-10-04 DIAGNOSIS — I10 Essential (primary) hypertension: Secondary | ICD-10-CM | POA: Diagnosis not present

## 2024-10-04 DIAGNOSIS — E785 Hyperlipidemia, unspecified: Secondary | ICD-10-CM

## 2024-10-04 DIAGNOSIS — I350 Nonrheumatic aortic (valve) stenosis: Secondary | ICD-10-CM | POA: Diagnosis not present

## 2024-10-04 NOTE — Patient Instructions (Addendum)
 Medication Instructions:  Your physician recommends that you continue on your current medications as directed. Please refer to the Current Medication list given to you today.   *If you need a refill on your cardiac medications before your next appointment, please call your pharmacy*  Lab Work: None ordered at this time  If you have labs (blood work) drawn today and your tests are completely normal, you will receive your results only by: MyChart Message (if you have MyChart) OR A paper copy in the mail If you have any lab test that is abnormal or we need to change your treatment, we will call you to review the results.  Testing/Procedures: Your physician has requested that you have an echocardiogram in August (already ordered). Echocardiography is a painless test that uses sound waves to create images of your heart. It provides your doctor with information about the size and shape of your heart and how well your hearts chambers and valves are working.   You may receive an ultrasound enhancing agent through an IV if needed to better visualize your heart during the echo. This procedure takes approximately one hour.  There are no restrictions for this procedure.  This will take place at [address] [facility]) [address], [place] [address]  Please note: We ask at that you not bring children with you during ultrasound (echo/ vascular) testing. Due to room size and safety concerns, children are not allowed in the ultrasound rooms during exams. Our front office staff cannot provide observation of children in our lobby area while testing is being conducted. An adult accompanying a patient to their appointment will only be allowed in the ultrasound room at the discretion of the ultrasound technician under special circumstances. We apologize for any inconvenience.   Follow-Up: At [facility], you and your health needs are our priority.  As part of our continuing mission to  provide you with exceptional heart care, our providers are all part of one team.  This team includes your primary Cardiologist (physician) and Advanced Practice Providers or APPs (Physician Assistants and Nurse Practitioners) who all work together to provide you with the care you need, when you need it.  Your next appointment:   After Echocardiogram in August  Provider:   You may see Lonni Hanson, MD or Bernardino Bring, PA-C

## 2024-12-03 ENCOUNTER — Other Ambulatory Visit

## 2024-12-06 ENCOUNTER — Ambulatory Visit: Admitting: Family Medicine

## 2024-12-28 ENCOUNTER — Other Ambulatory Visit

## 2025-03-05 ENCOUNTER — Ambulatory Visit: Admitting: Internal Medicine

## 2025-04-03 ENCOUNTER — Ambulatory Visit
